# Patient Record
Sex: Female | Born: 1990 | Race: Black or African American | Hispanic: No | Marital: Single | Smoking: Former smoker
Health system: Southern US, Community
[De-identification: ages and names within clinical notes are randomized; demographics above are authoritative.]

## PROBLEM LIST (undated history)

## (undated) DIAGNOSIS — F419 Anxiety disorder, unspecified: Secondary | ICD-10-CM

## (undated) DIAGNOSIS — F329 Major depressive disorder, single episode, unspecified: Secondary | ICD-10-CM

## (undated) DIAGNOSIS — F32A Depression, unspecified: Secondary | ICD-10-CM

## (undated) DIAGNOSIS — J45909 Unspecified asthma, uncomplicated: Secondary | ICD-10-CM

## (undated) HISTORY — PX: TONSILLECTOMY: SUR1361

---
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
## 2009-12-26 ENCOUNTER — Ambulatory Visit: Payer: Self-pay

## 2010-02-09 ENCOUNTER — Emergency Department: Payer: Self-pay | Admitting: Emergency Medicine

## 2010-04-21 ENCOUNTER — Emergency Department: Payer: Self-pay | Admitting: Emergency Medicine

## 2010-07-31 ENCOUNTER — Emergency Department: Payer: Self-pay | Admitting: Emergency Medicine

## 2010-08-08 ENCOUNTER — Emergency Department: Payer: Self-pay | Admitting: Unknown Physician Specialty

## 2010-09-27 ENCOUNTER — Observation Stay: Payer: Self-pay | Admitting: Obstetrics and Gynecology

## 2010-10-31 ENCOUNTER — Observation Stay: Payer: Self-pay

## 2010-12-30 ENCOUNTER — Observation Stay: Payer: Self-pay | Admitting: Obstetrics and Gynecology

## 2011-01-02 ENCOUNTER — Observation Stay: Payer: Self-pay | Admitting: Obstetrics and Gynecology

## 2011-02-04 ENCOUNTER — Observation Stay: Payer: Self-pay | Admitting: Obstetrics and Gynecology

## 2011-02-06 ENCOUNTER — Observation Stay: Payer: Self-pay | Admitting: Obstetrics and Gynecology

## 2011-02-09 ENCOUNTER — Inpatient Hospital Stay: Payer: Self-pay | Admitting: Obstetrics and Gynecology

## 2011-06-14 ENCOUNTER — Emergency Department: Payer: Self-pay

## 2011-11-09 ENCOUNTER — Emergency Department: Payer: Self-pay | Admitting: Emergency Medicine

## 2011-11-09 LAB — URINALYSIS, COMPLETE
Bacteria: NONE SEEN
Bilirubin,UR: NEGATIVE
Blood: NEGATIVE
Glucose,UR: NEGATIVE mg/dL (ref 0–75)
Ketone: NEGATIVE
Leukocyte Esterase: NEGATIVE
Nitrite: NEGATIVE
Specific Gravity: 1.017 (ref 1.003–1.030)
Squamous Epithelial: 1
WBC UR: 1 /HPF (ref 0–5)

## 2011-11-09 LAB — COMPREHENSIVE METABOLIC PANEL
Albumin: 3.5 g/dL (ref 3.4–5.0)
Alkaline Phosphatase: 71 U/L (ref 50–136)
Anion Gap: 4 — ABNORMAL LOW (ref 7–16)
BUN: 9 mg/dL (ref 7–18)
Bilirubin,Total: 0.3 mg/dL (ref 0.2–1.0)
Calcium, Total: 9 mg/dL (ref 8.5–10.1)
Chloride: 109 mmol/L — ABNORMAL HIGH (ref 98–107)
Co2: 26 mmol/L (ref 21–32)
Creatinine: 0.81 mg/dL (ref 0.60–1.30)
EGFR (African American): >60
EGFR (Non-African Amer.): >60
Osmolality: 276 (ref 275–301)
SGPT (ALT): 20 U/L (ref 12–78)
Sodium: 139 mmol/L (ref 136–145)
Total Protein: 7.3 g/dL (ref 6.4–8.2)

## 2011-11-09 LAB — CBC
HGB: 12.4 g/dL (ref 12.0–16.0)
MCV: 72 fL — ABNORMAL LOW (ref 80–100)
RBC: 5.35 10*6/uL — ABNORMAL HIGH (ref 3.80–5.20)
RDW: 17.7 % — ABNORMAL HIGH (ref 11.5–14.5)
WBC: 8.8 10*3/uL (ref 3.6–11.0)

## 2011-11-09 LAB — PREGNANCY, URINE: Pregnancy Test, Urine: NEGATIVE m[IU]/mL

## 2012-02-11 IMAGING — US ABDOMEN ULTRASOUND
1 series · 17 of 25 positions shown · non-contrast
Comparison: none

REASON FOR EXAM: pain ruq and epigastric,  pregnant
COMMENTS:   LMP: N/A

[Series 1: abdomen ultrasound · 17 of 73 slices shown]
[im 1/73]
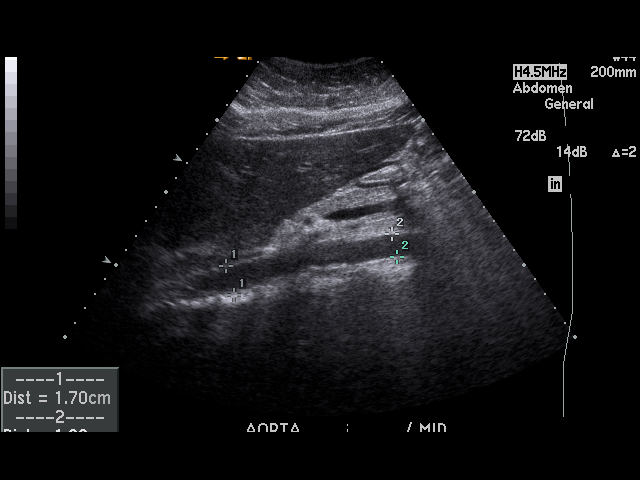
[im 7/73]
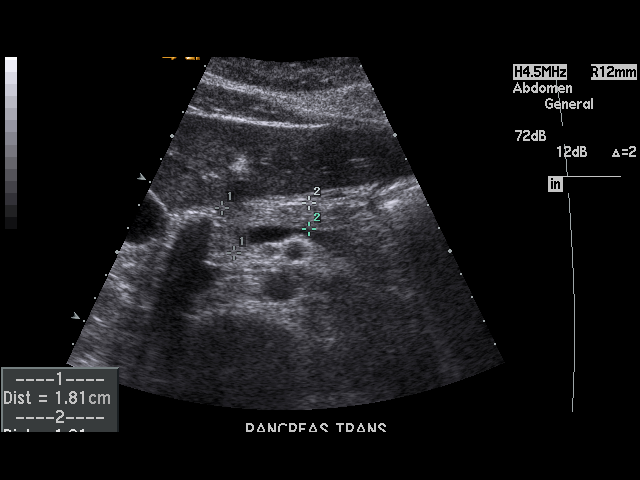
[im 10/73]
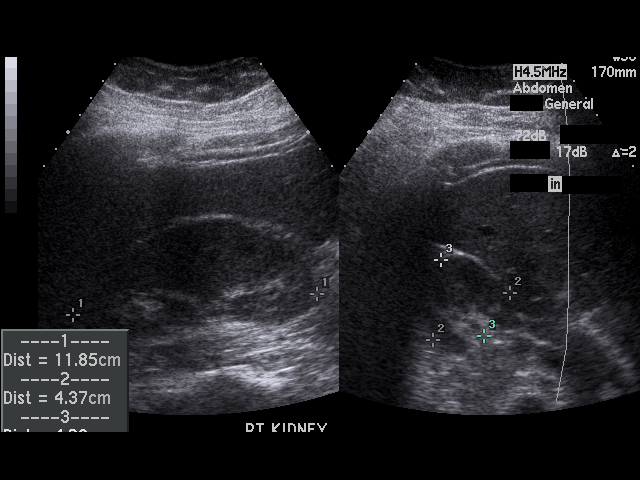
[im 16/73]
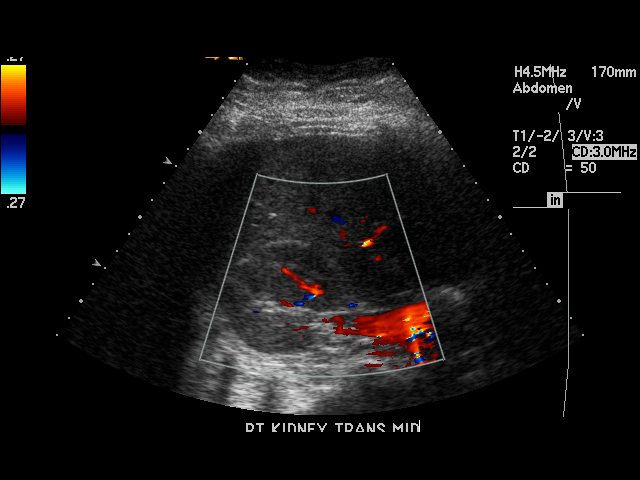
[im 19/73]
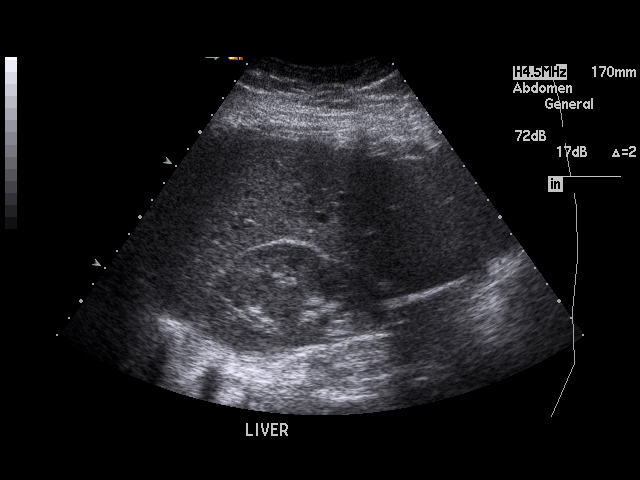
[im 25/73]
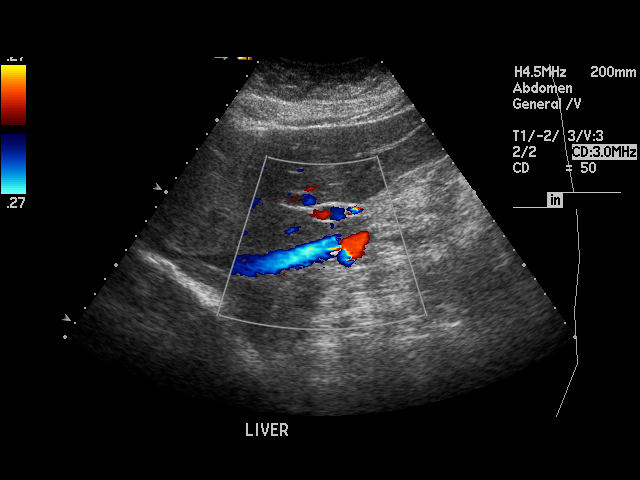
[im 28/73]
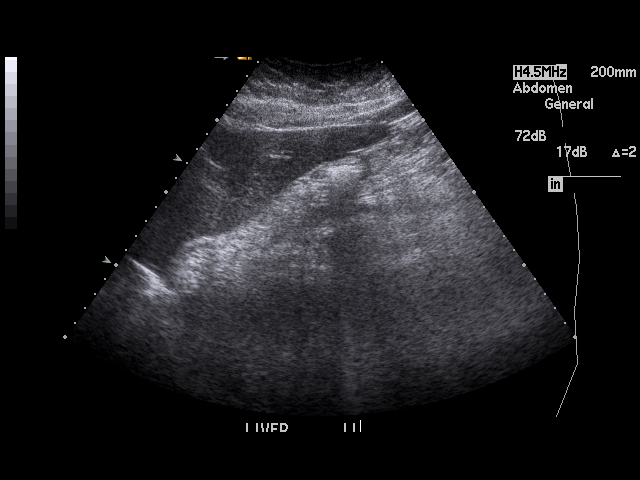
[im 34/73]
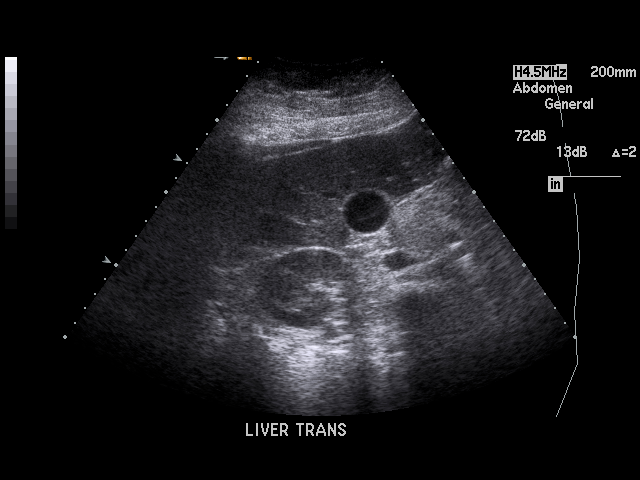
[im 37/73]
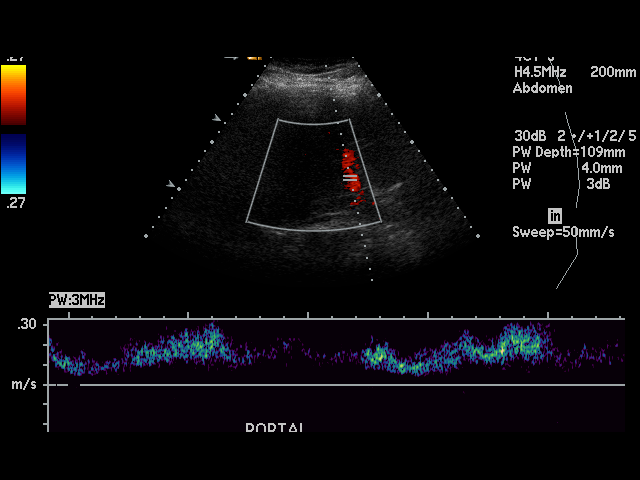
[im 40/73]
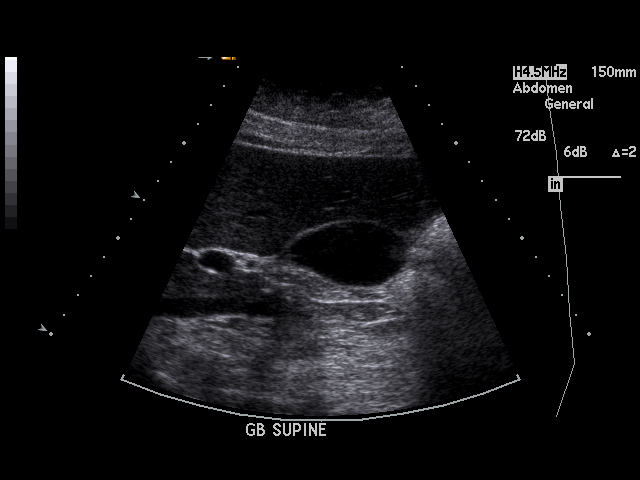
[im 46/73]
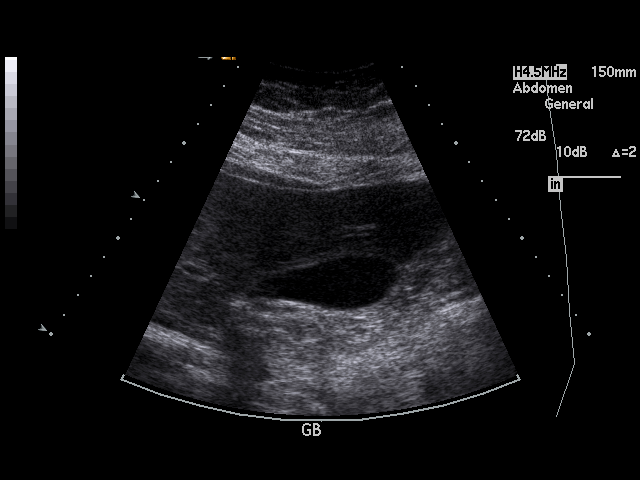
[im 49/73]
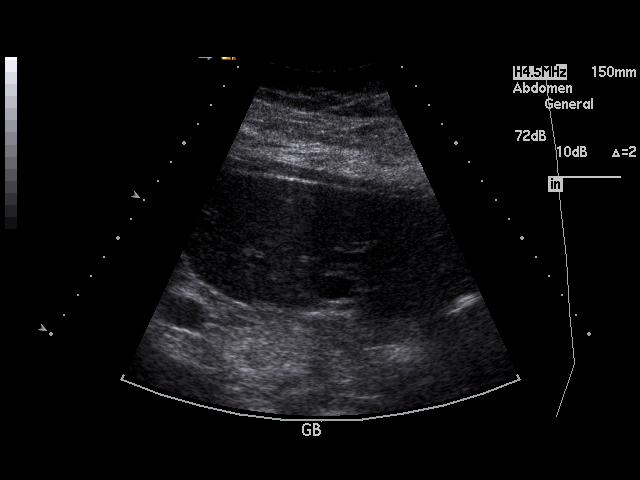
[im 55/73]
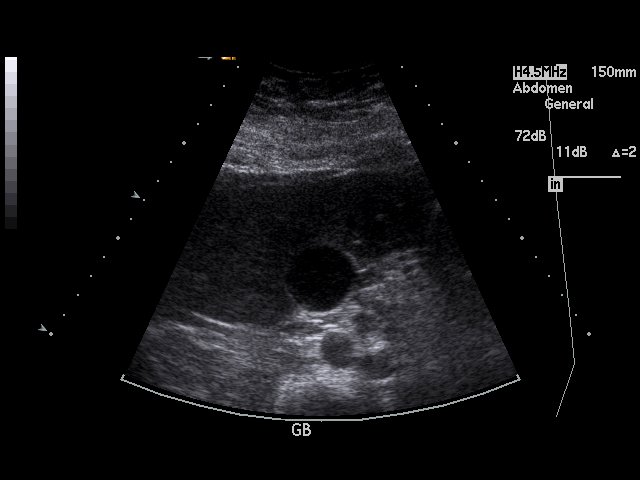
[im 58/73]
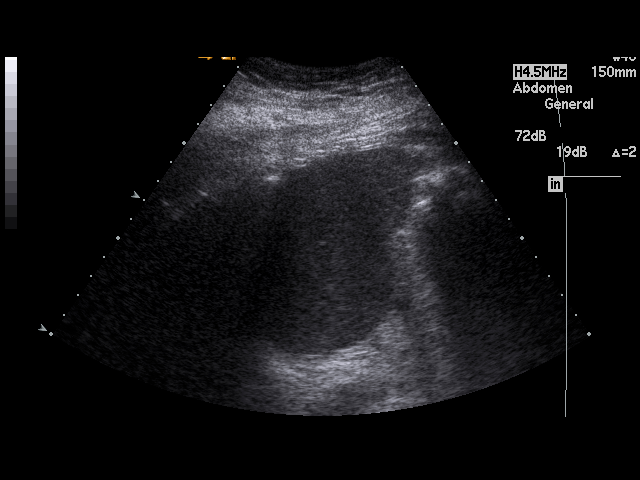
[im 64/73]
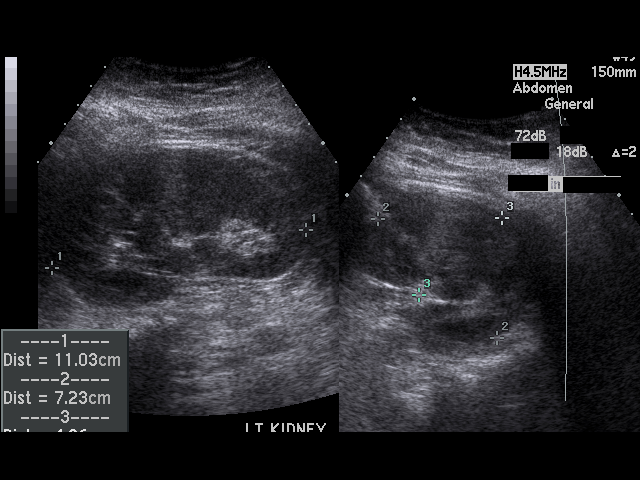
[im 67/73]
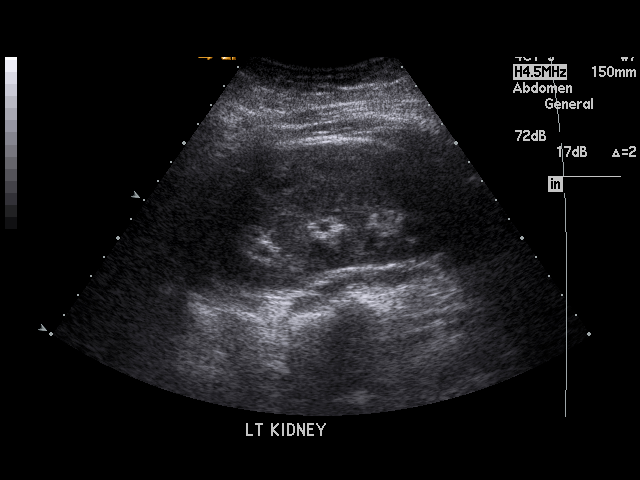
[im 73/73]
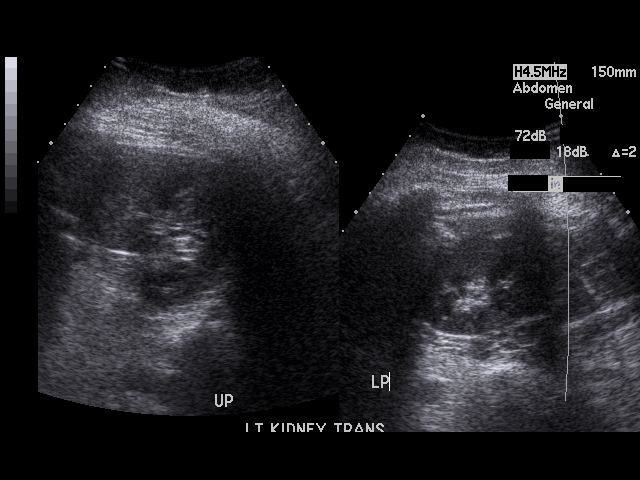

[17 of 25 positions shown; findings below may reference images not displayed]

PROCEDURE:     US  - US ABDOMEN GENERAL SURVEY  - July 31, 2010  [DATE]

RESULT:     The liver, spleen and inferior vena cava show no significant
abnormalities. Portions of the pancreas and of the abdominal aorta are
obscured by bowel gas but the visualized portions are normal in appearance.
No gallstones are seen. There is no thickening of the gall bladder wall. The
common bile duct measures 3.1 mm in diameter which is within normal limits.
The kidneys show no hydronephrosis. Sagittally, the right kidney measures
11.85 cm and the left measures 11.03 cm. No ascites is seen.
IMPRESSION: 1.  No gallstones or other acute changes identified.
2.  No hydronephrosis is observed.
3.  The patient reportedly is pregnant.

## 2012-08-02 ENCOUNTER — Emergency Department: Payer: Self-pay | Admitting: Emergency Medicine

## 2013-03-15 ENCOUNTER — Ambulatory Visit: Payer: Self-pay

## 2013-03-15 LAB — RAPID STREP-A WITH REFLX: Micro Text Report: NEGATIVE

## 2013-03-18 LAB — BETA STREP CULTURE(ARMC)

## 2014-09-26 ENCOUNTER — Encounter: Payer: Self-pay | Admitting: Emergency Medicine

## 2014-09-26 ENCOUNTER — Emergency Department
Admission: EM | Admit: 2014-09-26 | Discharge: 2014-09-26 | Disposition: A | Discharge status: Home / Self Care | Payer: Medicaid Other | Attending: Emergency Medicine | Admitting: Emergency Medicine

## 2014-09-26 DIAGNOSIS — Z720 Tobacco use: Secondary | ICD-10-CM | POA: Diagnosis not present

## 2014-09-26 DIAGNOSIS — N3090 Cystitis, unspecified without hematuria: Secondary | ICD-10-CM | POA: Diagnosis not present

## 2014-09-26 DIAGNOSIS — Z880 Allergy status to penicillin: Secondary | ICD-10-CM | POA: Insufficient documentation

## 2014-09-26 DIAGNOSIS — R1030 Lower abdominal pain, unspecified: Secondary | ICD-10-CM | POA: Diagnosis present

## 2014-09-26 DIAGNOSIS — Z79899 Other long term (current) drug therapy: Secondary | ICD-10-CM | POA: Diagnosis not present

## 2014-09-26 LAB — URINALYSIS COMPLETE WITH MICROSCOPIC (ARMC ONLY)
Bilirubin Urine: NEGATIVE
Glucose, UA: NEGATIVE mg/dL
KETONES UR: NEGATIVE mg/dL
NITRITE: NEGATIVE
PH: 6 (ref 5.0–8.0)
Protein, ur: 30 mg/dL — AB
SPECIFIC GRAVITY, URINE: 1.03 (ref 1.005–1.030)

## 2014-09-26 LAB — CBC WITH DIFFERENTIAL/PLATELET
BASOS ABS: 0.1 10*3/uL (ref 0–0.1)
Basophils Relative: 1 %
EOS ABS: 0.2 10*3/uL (ref 0–0.7)
Eosinophils Relative: 3 %
HEMATOCRIT: 41 % (ref 35.0–47.0)
HEMOGLOBIN: 13.2 g/dL (ref 12.0–16.0)
Lymphocytes Relative: 34 %
Lymphs Abs: 2.5 10*3/uL (ref 1.0–3.6)
MCH: 25.9 pg — ABNORMAL LOW (ref 26.0–34.0)
MCHC: 32.2 g/dL (ref 32.0–36.0)
MCV: 80.4 fL (ref 80.0–100.0)
Monocytes Absolute: 0.6 10*3/uL (ref 0.2–0.9)
Monocytes Relative: 8 %
NEUTROS ABS: 4 10*3/uL (ref 1.4–6.5)
Neutrophils Relative %: 54 %
Platelets: 231 10*3/uL (ref 150–440)
RBC: 5.1 MIL/uL (ref 3.80–5.20)
RDW: 14.2 % (ref 11.5–14.5)
WBC: 7.3 10*3/uL (ref 3.6–11.0)

## 2014-09-26 LAB — COMPREHENSIVE METABOLIC PANEL
ALK PHOS: 50 U/L (ref 38–126)
ALT: 19 U/L (ref 14–54)
AST: 13 U/L — ABNORMAL LOW (ref 15–41)
Albumin: 3.8 g/dL (ref 3.5–5.0)
Anion gap: 6 (ref 5–15)
BUN: 7 mg/dL (ref 6–20)
CO2: 27 mmol/L (ref 22–32)
Calcium: 9 mg/dL (ref 8.9–10.3)
Chloride: 107 mmol/L (ref 101–111)
Creatinine, Ser: 0.76 mg/dL (ref 0.44–1.00)
GFR calc Af Amer: >60 mL/min (ref >60)
Glucose, Bld: 86 mg/dL (ref 65–99)
Potassium: 4 mmol/L (ref 3.5–5.1)
Sodium: 140 mmol/L (ref 135–145)
Total Bilirubin: 0.6 mg/dL (ref 0.3–1.2)
Total Protein: 7.3 g/dL (ref 6.5–8.1)

## 2014-09-26 LAB — LIPASE, BLOOD: Lipase: 21 U/L — ABNORMAL LOW (ref 22–51)

## 2014-09-26 MED ORDER — NITROFURANTOIN MACROCRYSTAL 100 MG PO CAPS: 100.0000 mg | ORAL_CAPSULE | Freq: Two times a day (BID) | ORAL | Status: DC

## 2014-09-26 MED ORDER — ONDANSETRON 4 MG PO TBDP
8.0000 mg | ORAL_TABLET | Freq: Once | ORAL | Status: AC
  Administered 2014-09-26: 8 mg via ORAL
  Filled 2014-09-26: qty 2

## 2014-09-26 NOTE — ED Notes (Signed)
Reports pelvic pain.  Seen at health dept yesterday, states today she started having vaginal bleeding.  Reports  she has an IUD.

## 2014-09-26 NOTE — ED Provider Notes (Signed)
[facility] Emergency Department Provider Note  ____________________________________________  Time seen: 2:15 PM  I have reviewed the triage vital signs and the nursing notes.   HISTORY  Chief Complaint Abdominal Pain    HPI Alexis Gutierrez is a 24 y.o. female who complains of pain in her suprapubic area for the past 2 days. She was seen at the health department yesterday where she had a pelvic exam and was told to take Tylenol without any other medications. She has an IUD and is concerned that this is causing her problems. She reported some vaginal bleeding today. Her last period was more than 3 months ago.No fevers chills no vomiting, no vaginal discharge or syncope.     History reviewed. No pertinent past medical history.  There are no active problems to display for this patient.   Past Surgical History  Procedure Laterality Date  . Tonsillectomy      Current Outpatient Rx  Name  Route  Sig  Dispense  Refill  . nitrofurantoin (MACRODANTIN) 100 MG capsule   Oral   Take 1 capsule (100 mg total) by mouth 2 (two) times daily.   6 capsule   0     Allergies Amoxicillin and Penicillins  History reviewed. No pertinent family history.  Social History History  Substance Use Topics  . Smoking status: Current Some Day Smoker  . Smokeless tobacco: Not on file  . Alcohol Use: No    Review of Systems  Constitutional: No fever or chills. No weight changes Eyes:No blurry vision or double vision.  ENT: No sore throat. Cardiovascular: No chest pain. Respiratory: No dyspnea or cough. Gastrointestinal: Negative for abdominal pain, vomiting and diarrhea.  No BRBPR or melena. Genitourinary: Negative for dysuria, urinary retention, bloody urine, or difficulty urinating. Complaint of vaginal bleeding today Musculoskeletal: Negative for back pain. No joint swelling or pain. Skin: Negative for rash. Neurological: Negative for headaches, focal weakness  or numbness. Psychiatric:No anxiety or depression.   Endocrine:No hot/cold intolerance, changes in energy, or sleep difficulty.  10-point ROS otherwise negative.  ____________________________________________   PHYSICAL EXAM:  VITAL SIGNS: ED Triage Vitals  Enc Vitals Group     BP 09/26/14 1243 121/78 mmHg     Pulse Rate 09/26/14 1243 62     Resp 09/26/14 1243 18     Temp 09/26/14 1243 98.4 F (36.9 C)     Temp Source 09/26/14 1243 Oral     SpO2 09/26/14 1243 100 %     Weight 09/26/14 1243 310 lb (140.615 kg)     Height 09/26/14 1243  (1.626 m)     Head Cir --      Peak Flow --      Pain Score 09/26/14 1246 10     Pain Loc --      Pain Edu? --      Excl. in GC? --      Constitutional: Alert and oriented. Well appearing and in no distress. Smiling, playing with phone .  Morbidly obese Eyes: No scleral icterus. No conjunctival pallor. PERRL. EOMI ENT   Head: Normocephalic and atraumatic.   Nose: No congestion/rhinnorhea. No septal hematoma   Mouth/Throat: MMM, no pharyngeal erythema. No peritonsillar mass. No uvula shift.   Neck: No stridor. No SubQ emphysema. No meningismus. Hematological/Lymphatic/Immunilogical: No cervical lymphadenopathy. Cardiovascular: RRR. Normal and symmetric distal pulses are present in all extremities. No murmurs, rubs, or gallops. Respiratory: Normal respiratory effort without tachypnea nor retractions. Breath sounds are clear and  equal bilaterally. No wheezes/rales/rhonchi. Gastrointestinal: Soft with suprapubic tenderness. No distention. There is no CVA tenderness.  No rebound, rigidity, or guarding. Genitourinary: deferred Musculoskeletal: Nontender with normal range of motion in all extremities. No joint effusions.  No lower extremity tenderness.  No edema. Neurologic:   Normal speech and language.  CN 2-10 normal. Motor grossly intact. No pronator drift.  Normal gait. No gross focal neurologic deficits are appreciated.   Skin:  Skin is warm, dry and intact. No rash noted.  No petechiae, purpura, or bullae. Psychiatric: Mood and affect are normal. Speech and behavior are normal. Patient exhibits appropriate insight and judgment.  ____________________________________________    LABS (pertinent positives/negatives) (all labs ordered are listed, but only abnormal results are displayed) Labs Reviewed  CBC WITH DIFFERENTIAL/PLATELET - Abnormal; Notable for the following:    MCH 25.9 (*)    All other components within normal limits  URINALYSIS COMPLETEWITH MICROSCOPIC ([facility] ONLY) - Abnormal; Notable for the following:    Color, Urine YELLOW (*)    APPearance HAZY (*)    Hgb urine dipstick 3+ (*)    Protein, ur 30 (*)    Leukocytes, UA 2+ (*)    Bacteria, UA FEW (*)    Squamous Epithelial / LPF 6-30 (*)    All other components within normal limits  URINE CULTURE  COMPREHENSIVE METABOLIC PANEL  LIPASE, BLOOD  POC URINE PREG, ED   POC urine pregnancy negative ____________________________________________   EKG    ____________________________________________    RADIOLOGY    ____________________________________________   PROCEDURES  ____________________________________________   INITIAL IMPRESSION / ASSESSMENT AND PLAN / ED COURSE  Pertinent labs & imaging results that were available during my care of the patient were reviewed by me and considered in my medical decision making (see chart for details).  Patient presents with suprapubic pain and suprapubic tenderness and a urinalysis consistent with urinary tract infection and cystitis. No evidence of pyelonephritis on exam and vitals. Patient related some mild nausea will give her Zofran but is otherwise tolerating oral intake. We'll start her on Macrobid and send a follow-up urine culture.  ____________________________________________   FINAL CLINICAL IMPRESSION(S) / ED DIAGNOSES  Final diagnoses:  Cystitis      Sharman Cheek, MD 09/26/14 1440

## 2014-09-26 NOTE — Discharge Instructions (Signed)

## 2014-09-28 LAB — URINE CULTURE: Special Requests: NORMAL

## 2015-01-03 ENCOUNTER — Encounter: Payer: Self-pay | Admitting: Emergency Medicine

## 2015-01-03 ENCOUNTER — Emergency Department: Payer: Medicaid Other

## 2015-01-03 ENCOUNTER — Emergency Department
Admission: EM | Admit: 2015-01-03 | Discharge: 2015-01-03 | Disposition: A | Discharge status: Home / Self Care | Payer: Medicaid Other | Attending: Emergency Medicine | Admitting: Emergency Medicine

## 2015-01-03 DIAGNOSIS — Z880 Allergy status to penicillin: Secondary | ICD-10-CM | POA: Insufficient documentation

## 2015-01-03 DIAGNOSIS — M25561 Pain in right knee: Secondary | ICD-10-CM | POA: Insufficient documentation

## 2015-01-03 DIAGNOSIS — Z720 Tobacco use: Secondary | ICD-10-CM | POA: Diagnosis not present

## 2015-01-03 DIAGNOSIS — G8929 Other chronic pain: Secondary | ICD-10-CM | POA: Insufficient documentation

## 2015-01-03 DIAGNOSIS — R2689 Other abnormalities of gait and mobility: Secondary | ICD-10-CM | POA: Diagnosis not present

## 2015-01-03 DIAGNOSIS — M25551 Pain in right hip: Secondary | ICD-10-CM | POA: Diagnosis not present

## 2015-01-03 DIAGNOSIS — M79604 Pain in right leg: Secondary | ICD-10-CM | POA: Diagnosis present

## 2015-01-03 DIAGNOSIS — R52 Pain, unspecified: Secondary | ICD-10-CM

## 2015-01-03 DIAGNOSIS — Z792 Long term (current) use of antibiotics: Secondary | ICD-10-CM | POA: Insufficient documentation

## 2015-01-03 DIAGNOSIS — N390 Urinary tract infection, site not specified: Secondary | ICD-10-CM | POA: Diagnosis not present

## 2015-01-03 DIAGNOSIS — Z3202 Encounter for pregnancy test, result negative: Secondary | ICD-10-CM | POA: Diagnosis not present

## 2015-01-03 HISTORY — DX: Unspecified asthma, uncomplicated: J45.909

## 2015-01-03 LAB — URINALYSIS COMPLETE WITH MICROSCOPIC (ARMC ONLY)
Bilirubin Urine: NEGATIVE
Glucose, UA: NEGATIVE mg/dL
KETONES UR: NEGATIVE mg/dL
NITRITE: NEGATIVE
PH: 5 (ref 5.0–8.0)
Protein, ur: NEGATIVE mg/dL
Specific Gravity, Urine: 1.023 (ref 1.005–1.030)

## 2015-01-03 LAB — POCT PREGNANCY, URINE: Preg Test, Ur: NEGATIVE

## 2015-01-03 MED ORDER — SULFAMETHOXAZOLE-TRIMETHOPRIM 800-160 MG PO TABS: 1.0000 | ORAL_TABLET | Freq: Two times a day (BID) | ORAL | Status: DC

## 2015-01-03 MED ORDER — MELOXICAM 15 MG PO TABS: 15.0000 mg | ORAL_TABLET | Freq: Every day | ORAL | Status: DC

## 2015-01-03 NOTE — ED Provider Notes (Signed)
[facility] Emergency Department Provider Note  ____________________________________________  Time seen: Approximately 4:10 PM  I have reviewed the triage vital signs and the nursing notes.   HISTORY  Chief Complaint Leg Pain    HPI Alexis Gutierrez is a 24 y.o. female patient complain of right hip and right knee pain for 3 days. 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 Patient stated the pain in the hip has increase in the last 2-3 days especially with walking. Patient also noticed she is having difficulty flexing her right knee when child ambulate. Patient states there is also pain with flexion the knee in a nonweightbearing status. Patient is rating her pain and discomfort from the hip to the knee as a 10 over 10. No palliative measures taken for this complaint. Patient states the last imaging done of her hip was in 2013. Past Medical History  Diagnosis Date  . Asthma     There are no active problems to display for this patient.   Past Surgical History  Procedure Laterality Date  . Tonsillectomy      Current Outpatient Rx  Name  Route  Sig  Dispense  Refill  . nitrofurantoin (MACRODANTIN) 100 MG capsule   Oral   Take 1 capsule (100 mg total) by mouth 2 (two) times daily.   6 capsule   0     Allergies Amoxicillin and Penicillins  No family history on file.  Social History Social History  Substance Use Topics  . Smoking status: Current Some Day Smoker  . Smokeless tobacco: None  . Alcohol Use: No    Review of Systems Constitutional: No fever/chills Eyes: No visual changes. ENT: No sore throat. Cardiovascular: Denies chest pain. Respiratory: Denies shortness of breath. Gastrointestinal: No abdominal pain.  No nausea, no vomiting.  No diarrhea.  No constipation. Genitourinary: Negative for dysuria. Musculoskeletal: Positive for back pain, right hip and right knee pain Skin: Negative for  rash. Neurological: Negative for headaches, focal weakness or numbness. Endocrine:Mood obesity Allergic/Immunilogical: Penicillin  10-point ROS otherwise negative.  ____________________________________________   PHYSICAL EXAM:  VITAL SIGNS: ED Triage Vitals  Enc Vitals Group     BP 01/03/15 1517 126/61 mmHg     Pulse Rate 01/03/15 1517 82     Resp 01/03/15 1517 18     Temp 01/03/15 1517 99.1 F (37.3 C)     Temp Source 01/03/15 1517 Oral     SpO2 01/03/15 1517 98 %     Weight 01/03/15 1517 306 lb (138.801 kg)     Height 01/03/15 1517 5\' 4"  (1.626 m)     Head Cir --      Peak Flow --      Pain Score 01/03/15 1518 10     Pain Loc --      Pain Edu? --      Excl. in GC? --     Constitutional: Alert and oriented. Well appearing and in no acute distress. Morbid obesity Eyes: Conjunctivae are normal. PERRL. EOMI. Head: Atraumatic. Nose: No congestion/rhinnorhea. Mouth/Throat: Mucous membranes are moist.  Oropharynx non-erythematous. Neck: No stridor.  No cervical spine tenderness to palpation. Hematological/Lymphatic/Immunilogical: No cervical lymphadenopathy. Cardiovascular: Normal rate, regular rhythm. Grossly normal heart sounds.  Good peripheral circulation. Respiratory: Normal respiratory effort.  No retractions. Lungs CTAB. Gastrointestinal: Soft and nontender. No distention. No abdominal bruits. No CVA tenderness. Musculoskeletal: No deformity of the right hip no leg length discrepancy. Patient is tender palpation at  greater trochanter area. There is no right knee deformity. There is no effusion. She is tender palpation anterior patella patient have decrease range of motion with flexion of the knee limited by complaining of pain.  Neurologic:  Normal speech and language. No gross focal neurologic deficits are appreciated. No gait instability. Skin:  Skin is warm, dry and intact. No rash noted. Psychiatric: Mood and affect are normal. Speech and behavior are  normal.  ____________________________________________   LABS (all labs ordered are listed, but only abnormal results are displayed)  Labs Reviewed  URINALYSIS COMPLETEWITH MICROSCOPIC ([facility] ONLY) - Abnormal; Notable for the following:    Color, Urine YELLOW (*)    APPearance CLOUDY (*)    Hgb urine dipstick 1+ (*)    Leukocytes, UA 3+ (*)    Bacteria, UA FEW (*)    Squamous Epithelial / LPF TOO NUMEROUS TO COUNT (*)    All other components within normal limits  POC URINE PREG, ED  POCT PREGNANCY, URINE   ____________________________________________  EKG   ____________________________________________  RADIOLOGY  No acute findings right hip and right knee on x-ray. I, Joni Reining, personally viewed and evaluated these images (plain radiographs) as part of my medical decision making.   ____________________________________________   PROCEDURES  Procedure(s) performed: None  Critical Care performed: No  ____________________________________________   INITIAL IMPRESSION / ASSESSMENT AND PLAN / ED COURSE  Pertinent labs & imaging results that were available during my care of the patient were reviewed by me and considered in my medical decision making (see chart for details).  Chronic right hip pain. Right knee pain. Urinary tract infection. Discussed x-ray findings with patient. Advised patient to follow orthopedics for definitive evaluation and treatment of her hip and knee pain. Patient has urinary tract infection. Patient given a prescription for [facility] and Bactrim DS. Advised patient have urine retested by her PCP in 10 days. ____________________________________________   FINAL CLINICAL IMPRESSION(S) / ED DIAGNOSES  Final diagnoses:  Pain aggravated by walking  Chronic right hip pain  Right knee pain  UTI (lower urinary tract infection)      Joni Reining, PA-C 01/03/15 1736  Myrna Blazer, MD 01/03/15 [phone]

## 2015-01-03 NOTE — ED Notes (Signed)
Patient presents to the ED with right leg pain x 3 days.  Patient states pain is increasing as well as disability.  Patient states today she is having difficulty bending her knee and cannot walk since 2 days ago.  Patient reports chronic back pain.  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

## 2015-01-15 ENCOUNTER — Ambulatory Visit
Admission: RE | Admit: 2015-01-15 | Discharge: 2015-01-15 | Disposition: A | Discharge status: Home / Self Care | Payer: Disability Insurance | Source: Ambulatory Visit | Attending: Dentistry | Admitting: Dentistry

## 2015-01-15 ENCOUNTER — Other Ambulatory Visit: Payer: Self-pay | Admitting: Dentistry

## 2015-01-15 DIAGNOSIS — F419 Anxiety disorder, unspecified: Secondary | ICD-10-CM | POA: Diagnosis not present

## 2015-01-15 DIAGNOSIS — M519 Unspecified thoracic, thoracolumbar and lumbosacral intervertebral disc disorder: Secondary | ICD-10-CM | POA: Diagnosis present

## 2015-01-15 DIAGNOSIS — M25851 Other specified joint disorders, right hip: Secondary | ICD-10-CM | POA: Insufficient documentation

## 2015-01-15 DIAGNOSIS — F329 Major depressive disorder, single episode, unspecified: Secondary | ICD-10-CM | POA: Diagnosis not present

## 2015-01-15 DIAGNOSIS — F909 Attention-deficit hyperactivity disorder, unspecified type: Secondary | ICD-10-CM | POA: Insufficient documentation

## 2015-07-09 DIAGNOSIS — Z87898 Personal history of other specified conditions: Secondary | ICD-10-CM | POA: Insufficient documentation

## 2016-07-16 IMAGING — CR DG HIP (WITH OR WITHOUT PELVIS) 2-3V*R*
3 series · 3 of 3 positions shown · non-contrast
Comparison: None.

CLINICAL DATA: Right hip pain/stiffness, unable to bear weight

EXAM:
DG HIP (WITH OR WITHOUT PELVIS) 2-3V RIGHT

[pelvis ap]
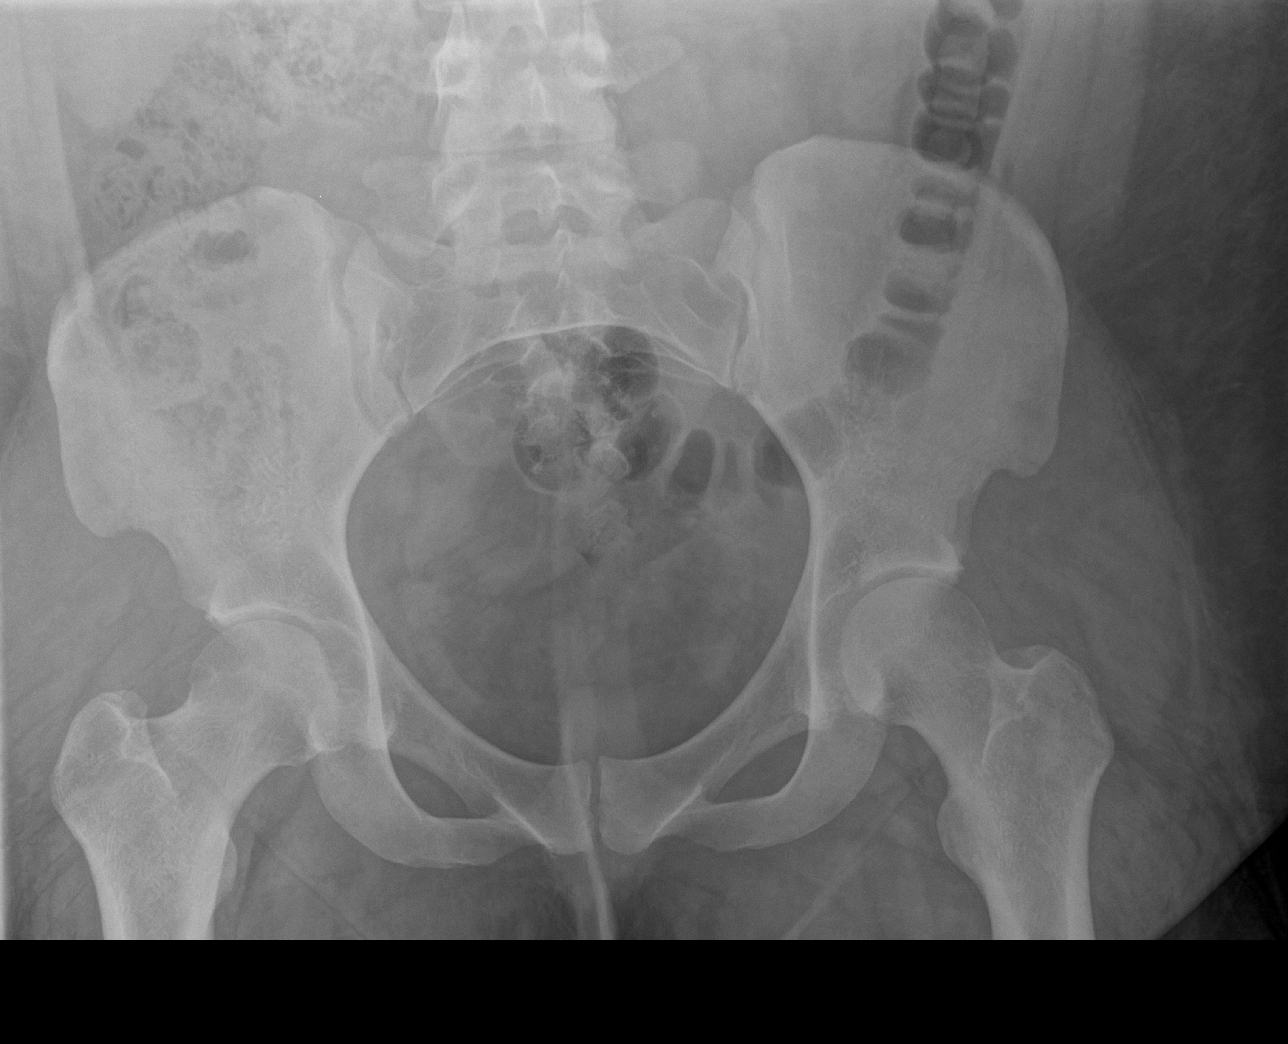

[hip ap]
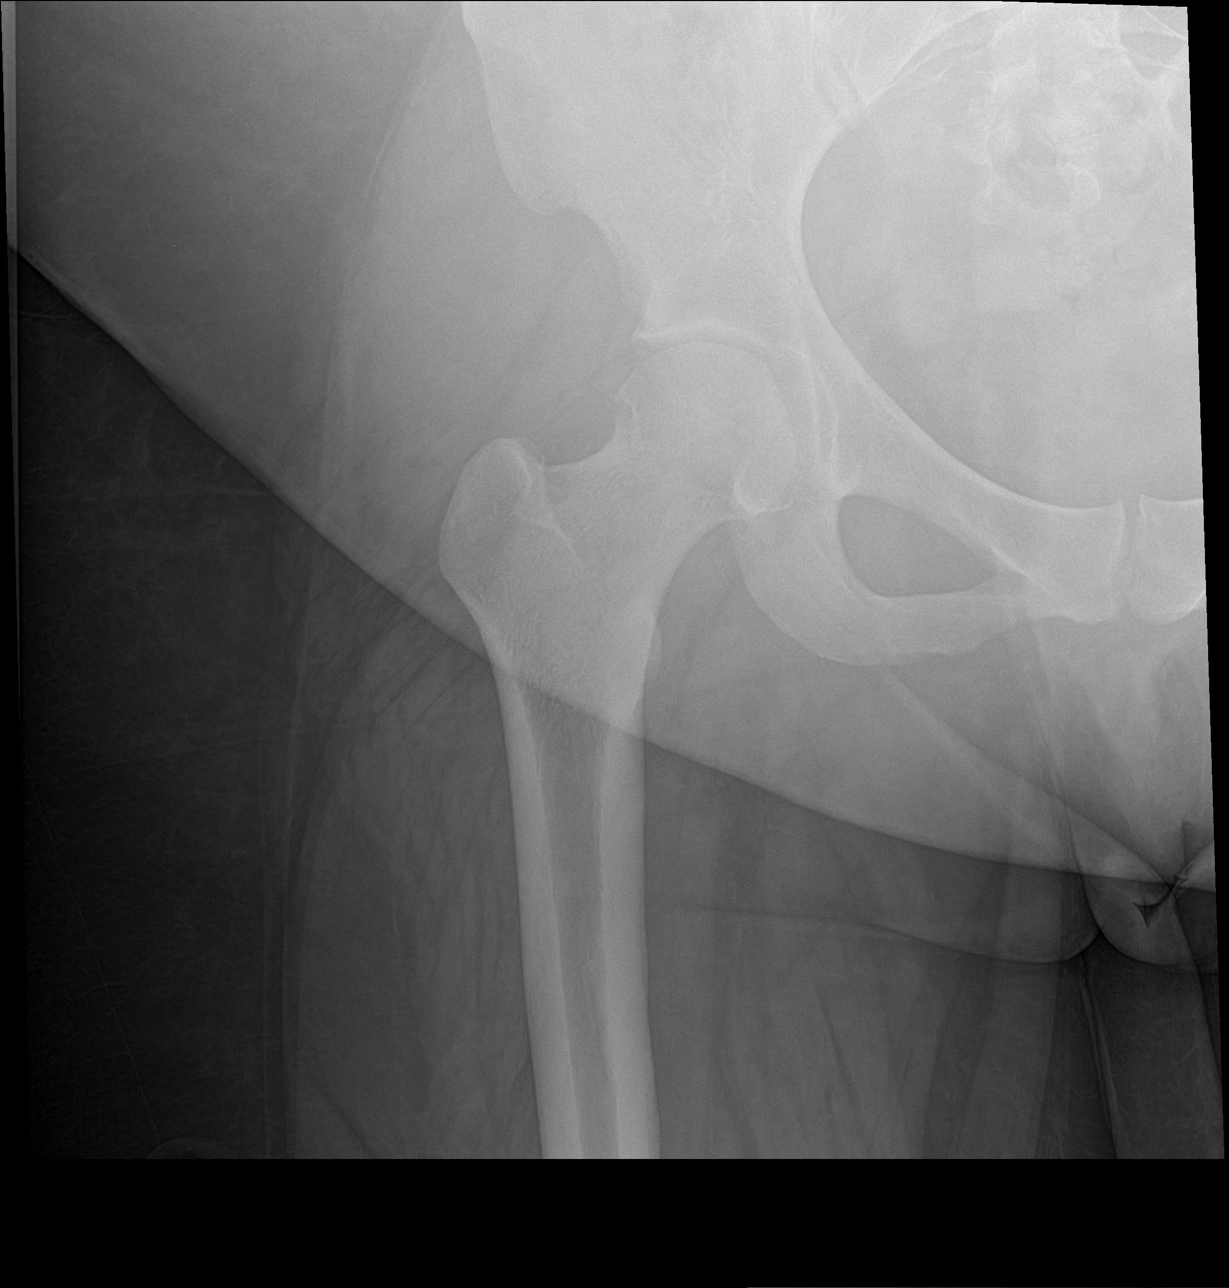

[hip lat]
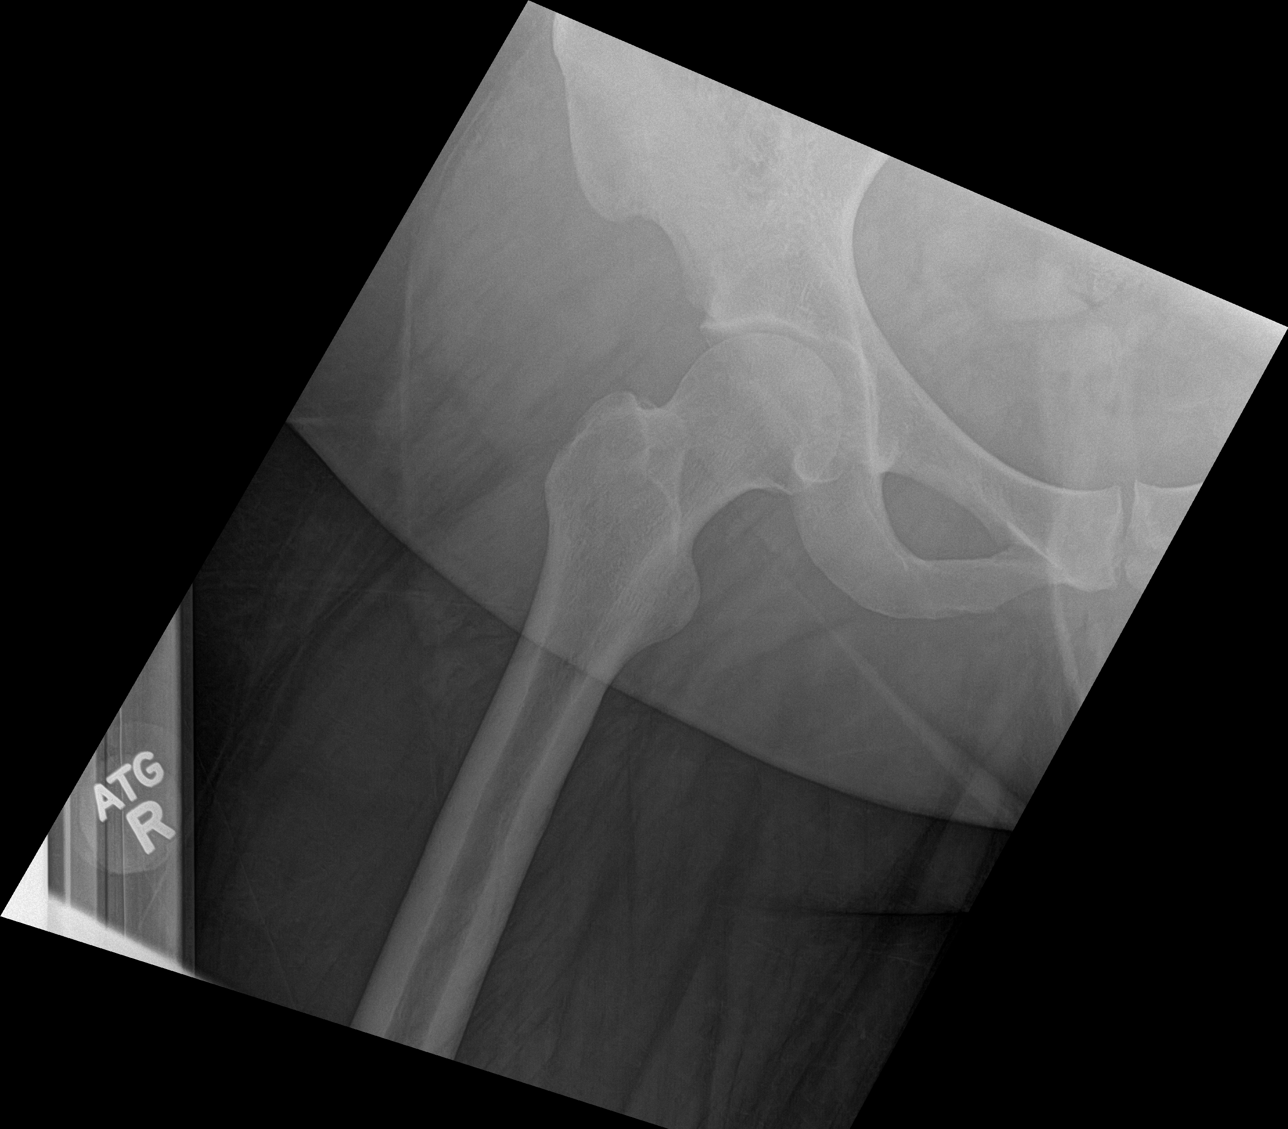

[3 of 3 positions shown; findings below may reference images not displayed]

FINDINGS: No fracture or dislocation is seen.

Bilateral hip joint spaces are preserved.

Visualized bony pelvis appears intact.

Lower lumbar spine is within normal limits.
IMPRESSION: Normal right hip radiographs.

## 2017-02-02 ENCOUNTER — Emergency Department
Admission: EM | Admit: 2017-02-02 | Discharge: 2017-02-02 | Disposition: A | Discharge status: Home / Self Care | Payer: Medicaid Other | Attending: Emergency Medicine | Admitting: Emergency Medicine

## 2017-02-02 ENCOUNTER — Encounter: Payer: Self-pay | Admitting: Emergency Medicine

## 2017-02-02 DIAGNOSIS — J45909 Unspecified asthma, uncomplicated: Secondary | ICD-10-CM | POA: Insufficient documentation

## 2017-02-02 DIAGNOSIS — R102 Pelvic and perineal pain: Secondary | ICD-10-CM | POA: Diagnosis not present

## 2017-02-02 DIAGNOSIS — A64 Unspecified sexually transmitted disease: Secondary | ICD-10-CM

## 2017-02-02 DIAGNOSIS — Z79899 Other long term (current) drug therapy: Secondary | ICD-10-CM | POA: Diagnosis not present

## 2017-02-02 DIAGNOSIS — R1030 Lower abdominal pain, unspecified: Secondary | ICD-10-CM | POA: Diagnosis present

## 2017-02-02 DIAGNOSIS — Z202 Contact with and (suspected) exposure to infections with a predominantly sexual mode of transmission: Secondary | ICD-10-CM | POA: Diagnosis not present

## 2017-02-02 DIAGNOSIS — F17200 Nicotine dependence, unspecified, uncomplicated: Secondary | ICD-10-CM | POA: Diagnosis not present

## 2017-02-02 LAB — COMPREHENSIVE METABOLIC PANEL
ALBUMIN: 3.6 g/dL (ref 3.5–5.0)
ALK PHOS: 51 U/L (ref 38–126)
ALT: 19 U/L (ref 14–54)
ANION GAP: 6 (ref 5–15)
AST: 14 U/L — AB (ref 15–41)
BILIRUBIN TOTAL: 0.4 mg/dL (ref 0.3–1.2)
BUN: 7 mg/dL (ref 6–20)
CALCIUM: 9 mg/dL (ref 8.9–10.3)
CO2: 25 mmol/L (ref 22–32)
CREATININE: 0.86 mg/dL (ref 0.44–1.00)
Chloride: 106 mmol/L (ref 101–111)
GFR calc Af Amer: >60 mL/min (ref >60)
GFR calc non Af Amer: >60 mL/min (ref >60)
GLUCOSE: 100 mg/dL — AB (ref 65–99)
Potassium: 4 mmol/L (ref 3.5–5.1)
Sodium: 137 mmol/L (ref 135–145)
TOTAL PROTEIN: 7 g/dL (ref 6.5–8.1)

## 2017-02-02 LAB — URINALYSIS, COMPLETE (UACMP) WITH MICROSCOPIC
Bacteria, UA: NONE SEEN
Bilirubin Urine: NEGATIVE
GLUCOSE, UA: NEGATIVE mg/dL
HGB URINE DIPSTICK: NEGATIVE
Ketones, ur: NEGATIVE mg/dL
NITRITE: NEGATIVE
Protein, ur: NEGATIVE mg/dL
SPECIFIC GRAVITY, URINE: 1.023 (ref 1.005–1.030)
pH: 5 (ref 5.0–8.0)

## 2017-02-02 LAB — WET PREP, GENITAL
CLUE CELLS WET PREP: NONE SEEN
Sperm: NONE SEEN
TRICH WET PREP: NONE SEEN
Yeast Wet Prep HPF POC: NONE SEEN

## 2017-02-02 LAB — CBC
HCT: 41.4 % (ref 35.0–47.0)
Hemoglobin: 13.5 g/dL (ref 12.0–16.0)
MCH: 26.3 pg (ref 26.0–34.0)
MCHC: 32.6 g/dL (ref 32.0–36.0)
MCV: 80.6 fL (ref 80.0–100.0)
PLATELETS: 238 10*3/uL (ref 150–440)
RBC: 5.13 MIL/uL (ref 3.80–5.20)
RDW: 14.8 % — AB (ref 11.5–14.5)
WBC: 7.2 10*3/uL (ref 3.6–11.0)

## 2017-02-02 LAB — POCT PREGNANCY, URINE
PREG TEST UR: NEGATIVE
Preg Test, Ur: NEGATIVE

## 2017-02-02 LAB — CHLAMYDIA/NGC RT PCR (ARMC ONLY)
CHLAMYDIA TR: NOT DETECTED
N GONORRHOEAE: NOT DETECTED

## 2017-02-02 LAB — LIPASE, BLOOD: LIPASE: 20 U/L (ref 11–51)

## 2017-02-02 MED ORDER — AZITHROMYCIN 500 MG PO TABS
2000.0000 mg | ORAL_TABLET | Freq: Once | ORAL | Status: AC
  Administered 2017-02-02: 2000 mg via ORAL
  Filled 2017-02-02: qty 4

## 2017-02-02 NOTE — ED Notes (Signed)
NAD noted at time of D/C. Pt denies questions or concerns. Pt ambulatory to the lobby at this time.  

## 2017-02-02 NOTE — ED Triage Notes (Signed)
Patient presents to the ED with lower abdominal pain x 6 days.  Patient denies nausea, vomiting and diarrhea.  Patient states, "I've been trying to just lay down and not pay it any attention but it's been getting worser."  Patient is in no obvious distress at this time.

## 2017-02-02 NOTE — ED Provider Notes (Signed)
[facility] Emergency Department Provider Note  ____________________________________________   First MD Initiated Contact with Patient 02/02/17 1016     (approximate)  I have reviewed the triage vital signs and the nursing notes.   HISTORY  Chief Complaint Abdominal Pain    HPI Alexis Gutierrez is a 26 y.o. female complains of lower abdominal pain, states she feels like something is moving around in there, denies fever or chills, denies constipation or diarrhea, she is sexually active, has unprotected sex with her boyfriend, states she does have some vaginal discharge,  milky to creamy colored, used to be on Depo-Provera, but switched to birth control pills in September, states she does take the pill regularly   Past Medical History:  Diagnosis Date  . Asthma     There are no active problems to display for this patient.   Past Surgical History:  Procedure Laterality Date  . TONSILLECTOMY      Prior to Admission medications   Not on File    Allergies Amoxicillin and Penicillins  No family history on file.  Social History Social History   Tobacco Use  . Smoking status: Current Some Day Smoker  Substance Use Topics  . Alcohol use: No  . Drug use: Not on file    Review of Systems  Constitutional: No fever/chills Eyes: No visual changes. ENT: No sore throat. Respiratory: Denies cough ABD: Positive Lower abdominal pain Genitourinary: Negative for dysuria. Positive for vaginal discharge Musculoskeletal: Negative for back pain. Skin: Negative for rash.    ____________________________________________   PHYSICAL EXAM:  VITAL SIGNS: ED Triage Vitals  Enc Vitals Group     BP 02/02/17 0828 134/68     Pulse Rate 02/02/17 0828 88     Resp 02/02/17 0828 16     Temp 02/02/17 0828 98.1 F (36.7 C)     Temp Source 02/02/17 0828 Oral     SpO2 02/02/17 0828 99 %     Weight 02/02/17 0828 (!) 318 lb (144.2 kg)     Height 02/02/17  0828 5\' 6"  (1.676 m)     Head Circumference --      Peak Flow --      Pain Score 02/02/17 0827 10     Pain Loc --      Pain Edu? --      Excl. in GC? --     Constitutional: Alert and oriented. Well appearing and in no acute distress. Eyes: Conjunctivae are normal.  Head: Atraumatic. Nose: No congestion/rhinnorhea. Mouth/Throat: Mucous membranes are moist.   Cardiovascular: Normal rate, regular rhythm. Respiratory: Normal respiratory effort.  No retractions, they're to auscultation ABD: Soft, mildly tender in the pelvic area, bowel sounds are normal all 4 quadrants GU: Exam showed milky white discharge, cervix is normal Musculoskeletal: FROM all extremities, warm and well perfused Neurologic:  Normal speech and language.  Skin:  Skin is warm, dry and intact. No rash noted. Psychiatric: Mood and affect are normal. Speech and behavior are normal.  ____________________________________________   LABS (all labs ordered are listed, but only abnormal results are displayed)  Labs Reviewed  WET PREP, GENITAL - Abnormal; Notable for the following components:      Result Value   WBC, Wet Prep HPF POC MODERATE (*)    All other components within normal limits  COMPREHENSIVE METABOLIC PANEL - Abnormal; Notable for the following components:   Glucose, Bld 100 (*)    AST 14 (*)    All other components  within normal limits  CBC - Abnormal; Notable for the following components:   RDW 14.8 (*)    All other components within normal limits  URINALYSIS, COMPLETE (UACMP) WITH MICROSCOPIC - Abnormal; Notable for the following components:   Color, Urine YELLOW (*)    APPearance HAZY (*)    Leukocytes, UA MODERATE (*)    Squamous Epithelial / LPF 6-30 (*)    All other components within normal limits  CHLAMYDIA/NGC RT PCR ([facility] ONLY)  LIPASE, BLOOD  POC URINE PREG, ED  POCT PREGNANCY, URINE  POCT PREGNANCY, URINE    ____________________________________________   ____________________________________________  RADIOLOGY    ____________________________________________   PROCEDURES  Procedure(s) performed: No      ____________________________________________   INITIAL IMPRESSION / ASSESSMENT AND PLAN / ED COURSE  Pertinent labs & imaging results that were available during my care of the patient were reviewed by me and considered in my medical decision making (see chart for details).  Patient is a 26 year old female complains of lower abdominal pain with vaginal discharge, urine showed moderate leuks, wet prep showed multiple white blood cells but no clue cells, yeast, or trichomoniasis, urine pregnant was negative, labs were normal, he was given azithromycin 2 g, she is allergic to penicillin which causes shortness of breath so we did not do Rocephin, explained to the patient that her gonorrhea and chlamydia test will not be back for several hours to the next day, a nurse will call her with the results, discuss safe sex, discussed proper hygiene, she is to return to the emergency department if she is worsening, otherwise follow-up with health department or her regular doctor      ____________________________________________   FINAL CLINICAL IMPRESSION(S) / ED DIAGNOSES  Final diagnoses:  Pelvic pain in female  STI (sexually transmitted infection)      NEW MEDICATIONS STARTED DURING THIS VISIT:  This SmartLink is deprecated. Use AVSMEDLIST instead to display the medication list for a patient.   Note:  This document was prepared using Dragon voice recognition software and may include unintentional dictation errors.    Faythe GheeFisher, Gianmarco Roye W, PA-C 02/02/17 1257    Dionne BucySiadecki, Sebastian, MD 02/02/17 1308

## 2017-02-02 NOTE — ED Triage Notes (Signed)
First Nurse Note:  Arrives with c/o abdominal pain/ pelvic pain x 6 days.  Ambulates with easy and steady gait.  Posture upright and relaxed.  NAD

## 2017-02-02 NOTE — Discharge Instructions (Signed)
On the regular doctor if you're not better in 3 days, he reviewed the medication to cover gonorrhea and chlamydia all year here in the emergency department, here test for gonorrhea and chlamydia have not yet been resulted, a nurse will be in touch with you to let you know the results in 3-5 days, if he develops fever or increased abdominal pain please return to the emergency department, your urine pregnancy test was negative

## 2017-10-29 ENCOUNTER — Emergency Department
Admission: EM | Admit: 2017-10-29 | Discharge: 2017-10-29 | Disposition: A | Discharge status: Home / Self Care | Payer: Medicaid Other | Attending: Emergency Medicine | Admitting: Emergency Medicine

## 2017-10-29 ENCOUNTER — Other Ambulatory Visit: Payer: Self-pay

## 2017-10-29 ENCOUNTER — Encounter: Payer: Self-pay | Admitting: Emergency Medicine

## 2017-10-29 DIAGNOSIS — N730 Acute parametritis and pelvic cellulitis: Secondary | ICD-10-CM

## 2017-10-29 DIAGNOSIS — N739 Female pelvic inflammatory disease, unspecified: Secondary | ICD-10-CM | POA: Diagnosis not present

## 2017-10-29 DIAGNOSIS — Z79899 Other long term (current) drug therapy: Secondary | ICD-10-CM | POA: Diagnosis not present

## 2017-10-29 DIAGNOSIS — J45909 Unspecified asthma, uncomplicated: Secondary | ICD-10-CM | POA: Insufficient documentation

## 2017-10-29 DIAGNOSIS — R102 Pelvic and perineal pain: Secondary | ICD-10-CM | POA: Diagnosis present

## 2017-10-29 DIAGNOSIS — Z87891 Personal history of nicotine dependence: Secondary | ICD-10-CM | POA: Insufficient documentation

## 2017-10-29 LAB — CBC
HCT: 39.2 % (ref 35.0–47.0)
Hemoglobin: 12.7 g/dL (ref 12.0–16.0)
MCH: 25.7 pg — AB (ref 26.0–34.0)
MCHC: 32.5 g/dL (ref 32.0–36.0)
MCV: 79.1 fL — AB (ref 80.0–100.0)
Platelets: 257 10*3/uL (ref 150–440)
RBC: 4.95 MIL/uL (ref 3.80–5.20)
RDW: 15.4 % — AB (ref 11.5–14.5)
WBC: 7.3 10*3/uL (ref 3.6–11.0)

## 2017-10-29 LAB — URINALYSIS, COMPLETE (UACMP) WITH MICROSCOPIC
Bilirubin Urine: NEGATIVE
Glucose, UA: NEGATIVE mg/dL
Hgb urine dipstick: NEGATIVE
Ketones, ur: NEGATIVE mg/dL
Nitrite: NEGATIVE
Protein, ur: NEGATIVE mg/dL
SPECIFIC GRAVITY, URINE: 1.025 (ref 1.005–1.030)
pH: 5 (ref 5.0–8.0)

## 2017-10-29 LAB — COMPREHENSIVE METABOLIC PANEL
ALT: 19 U/L (ref 0–44)
AST: 14 U/L — AB (ref 15–41)
Albumin: 3.8 g/dL (ref 3.5–5.0)
Alkaline Phosphatase: 42 U/L (ref 38–126)
Anion gap: 6 (ref 5–15)
BUN: 12 mg/dL (ref 6–20)
CHLORIDE: 106 mmol/L (ref 98–111)
CO2: 25 mmol/L (ref 22–32)
CREATININE: 0.67 mg/dL (ref 0.44–1.00)
Calcium: 9 mg/dL (ref 8.9–10.3)
GFR calc Af Amer: >60 mL/min (ref >60)
GFR calc non Af Amer: >60 mL/min (ref >60)
Glucose, Bld: 98 mg/dL (ref 70–99)
Potassium: 3.9 mmol/L (ref 3.5–5.1)
Sodium: 137 mmol/L (ref 135–145)
Total Bilirubin: 0.6 mg/dL (ref 0.3–1.2)
Total Protein: 7.2 g/dL (ref 6.5–8.1)

## 2017-10-29 LAB — WET PREP, GENITAL
CLUE CELLS WET PREP: NONE SEEN
SPERM: NONE SEEN
TRICH WET PREP: NONE SEEN
Yeast Wet Prep HPF POC: NONE SEEN

## 2017-10-29 LAB — CHLAMYDIA/NGC RT PCR (ARMC ONLY)
Chlamydia Tr: NOT DETECTED
N gonorrhoeae: NOT DETECTED

## 2017-10-29 LAB — POCT PREGNANCY, URINE: Preg Test, Ur: NEGATIVE

## 2017-10-29 LAB — LIPASE, BLOOD: Lipase: 22 U/L (ref 11–51)

## 2017-10-29 MED ORDER — CEFTRIAXONE SODIUM 250 MG IJ SOLR
250.0000 mg | Freq: Once | INTRAMUSCULAR | Status: AC
  Administered 2017-10-29: 250 mg via INTRAMUSCULAR
  Filled 2017-10-29: qty 250

## 2017-10-29 MED ORDER — ONDANSETRON 4 MG PO TBDP: 4.0000 mg | ORAL_TABLET | Freq: Three times a day (TID) | ORAL | 0 refills | Status: DC | PRN

## 2017-10-29 MED ORDER — DOXYCYCLINE HYCLATE 100 MG PO CAPS: 100.0000 mg | ORAL_CAPSULE | Freq: Two times a day (BID) | ORAL | 0 refills | Status: AC

## 2017-10-29 MED ORDER — DOXYCYCLINE HYCLATE 100 MG PO TABS
100.0000 mg | ORAL_TABLET | Freq: Once | ORAL | Status: AC
  Administered 2017-10-29: 100 mg via ORAL
  Filled 2017-10-29: qty 1

## 2017-10-29 MED ORDER — IBUPROFEN 600 MG PO TABS: 600.0000 mg | ORAL_TABLET | Freq: Four times a day (QID) | ORAL | 0 refills | Status: DC | PRN

## 2017-10-29 MED ORDER — IBUPROFEN 600 MG PO TABS
600.0000 mg | ORAL_TABLET | Freq: Once | ORAL | Status: AC
  Administered 2017-10-29: 600 mg via ORAL
  Filled 2017-10-29: qty 1

## 2017-10-29 NOTE — ED Notes (Signed)
.   Pt is resting, Respirations even and unlabored, NAD. Stretcher lowest postion and locked. Call bell within reach. Denies any needs at this time RN will continue to monitor.    

## 2017-10-29 NOTE — ED Notes (Signed)

## 2017-10-29 NOTE — ED Triage Notes (Signed)
Pt to ED via POV c/o lower abdominal pain that started yesterday. Pt also reports nausea and urinary frequency. Pt denies any other symptoms at this time.

## 2017-10-29 NOTE — ED Provider Notes (Signed)
[facility] Emergency Department Provider Note  ____________________________________________  Time seen: Approximately 10:51 AM  I have reviewed the triage vital signs and the nursing notes.   HISTORY  Chief Complaint Abdominal Pain   HPI Alexis Gutierrez is a 27 y.o. female the history of prior STDs who presents for evaluation of pelvic pain.  Patient reports having unprotected sex.  Has had 2 days of pelvic pain that she describes as pressure in her vagina.  She also reports urinary frequency.  No dysuria, no vaginal discharge, no abdominal pain, no fever or chills.  She has had mild nausea.  She reports that her pain is 10 out of 10.  Has not taken anything at home.  Past Medical History:  Diagnosis Date  . Asthma     There are no active problems to display for this patient.   Past Surgical History:  Procedure Laterality Date  . TONSILLECTOMY      Prior to Admission medications   Medication Sig Start Date End Date Taking? Authorizing Provider  buPROPion HCl (WELLBUTRIN PO) Take 1 tablet by mouth daily.   Yes [provider]  SERTRALINE HCL PO Take 1 tablet by mouth daily.   Yes [provider]  doxycycline (VIBRAMYCIN) 100 MG capsule Take 1 capsule (100 mg total) by mouth 2 (two) times daily for 7 days. 10/29/17 11/05/17  Nita Sickle, MD  ibuprofen (ADVIL,MOTRIN) 600 MG tablet Take 1 tablet (600 mg total) by mouth every 6 (six) hours as needed. 10/29/17   Nita Sickle, MD  ondansetron (ZOFRAN ODT) 4 MG disintegrating tablet Take 1 tablet (4 mg total) by mouth every 8 (eight) hours as needed for nausea or vomiting. 10/29/17   Nita Sickle, MD    Allergies Amoxicillin and Penicillins  No family history on file.  Social History Social History   Tobacco Use  . Smoking status: Former Games developer  . Smokeless tobacco: Never Used  Substance Use Topics  . Alcohol use: No  . Drug use: Not Currently    Review of  Systems  Constitutional: Negative for fever. Eyes: Negative for visual changes. ENT: Negative for sore throat. Neck: No neck pain  Cardiovascular: Negative for chest pain. Respiratory: Negative for shortness of breath. Gastrointestinal: Negative for abdominal pain, vomiting or diarrhea. Genitourinary: Negative for dysuria. + vaginal pain Musculoskeletal: Negative for back pain. Skin: Negative for rash. Neurological: Negative for headaches, weakness or numbness. Psych: No SI or HI  ____________________________________________   PHYSICAL EXAM:  VITAL SIGNS: ED Triage Vitals [10/29/17 0943]  Enc Vitals Group     BP 136/80     Pulse Rate 90     Resp 16     Temp 98.9 F (37.2 C)     Temp Source Oral     SpO2 97 %     Weight (!) 317 lb 14.5 oz (144.2 kg)     Height      Head Circumference      Peak Flow      Pain Score 10     Pain Loc      Pain Edu?      Excl. in GC?     Constitutional: Alert and oriented. Well appearing and in no apparent distress. HEENT:      Head: Normocephalic and atraumatic.         Eyes: Conjunctivae are normal. Sclera is non-icteric.       Mouth/Throat: Mucous membranes are moist.       Neck:  Supple with no signs of meningismus. Cardiovascular: Regular rate and rhythm. No murmurs, gallops, or rubs. 2+ symmetrical distal pulses are present in all extremities. No JVD. Respiratory: Normal respiratory effort. Lungs are clear to auscultation bilaterally. No wheezes, crackles, or rhonchi.  Gastrointestinal: Soft, non tender, and non distended with positive bowel sounds. No rebound or guarding. Genitourinary: No CVA tenderness. Pelvic exam: Normal external genitalia, no rashes or lesions. Normal cervical mucus. Os closed. cervical motion tenderness.  No uterine or adnexal tenderness.   Musculoskeletal: Nontender with normal range of motion in all extremities. No edema, cyanosis, or erythema of extremities. Neurologic: Normal speech and language. Face is  symmetric. Moving all extremities. No gross focal neurologic deficits are appreciated. Skin: Skin is warm, dry and intact. No rash noted. Psychiatric: Mood and affect are normal. Speech and behavior are normal.  ____________________________________________   LABS (all labs ordered are listed, but only abnormal results are displayed)  Labs Reviewed  WET PREP, GENITAL - Abnormal; Notable for the following components:      Result Value   WBC, Wet Prep HPF POC MODERATE (*)    All other components within normal limits  COMPREHENSIVE METABOLIC PANEL - Abnormal; Notable for the following components:   AST 14 (*)    All other components within normal limits  CBC - Abnormal; Notable for the following components:   MCV 79.1 (*)    MCH 25.7 (*)    RDW 15.4 (*)    All other components within normal limits  URINALYSIS, COMPLETE (UACMP) WITH MICROSCOPIC - Abnormal; Notable for the following components:   Color, Urine YELLOW (*)    APPearance CLOUDY (*)    Leukocytes, UA MODERATE (*)    Bacteria, UA RARE (*)    All other components within normal limits  CHLAMYDIA/NGC RT PCR ([facility] ONLY)  URINE CULTURE  LIPASE, BLOOD  POC URINE PREG, ED  POCT PREGNANCY, URINE   ____________________________________________  EKG  none  ____________________________________________  RADIOLOGY  none  ____________________________________________   PROCEDURES  Procedure(s) performed: None Procedures Critical Care performed:  None ____________________________________________   INITIAL IMPRESSION / ASSESSMENT AND PLAN / ED COURSE   27 y.o. female the history of prior STDs who presents for evaluation of pelvic pain x 2 days that started after patient had unprotected sex.  Also complained of urinary frequency.  Urinalysis showing rare bacteria with lots of WBCs but negative nitrites.  Possibly UTI versus STD.  Patient did have CMT on exam therefore will treat for PID.  No adnexal tenderness,  normal white count, no fever, with no clinical signs or symptoms of tubo-ovarian abscess at this time.  Labs are otherwise within normal limits.  Pregnancy test is negative.    _________________________ 12:26 PM on 10/29/2017 -----------------------------------------  Wet prep negative. Gc/chlamydia pending however based on CMT will treat for PID.  Patient received IM Rocephin and was started on doxycycline.  Patient reports feeling nauseated after taking ibuprofen so we will give her a prescription for Zofran, ibuprofen, and doxycycline.  Discussed testing of the sexual partners.  Discussed return precautions for fever, worsening vaginal pain, abdominal pain.  Discussed close follow-up with primary care doctor  As part of my medical decision making, I reviewed the following data within the electronic MEDICAL RECORD NUMBER Nursing notes reviewed and incorporated, Labs reviewed , Old chart reviewed, Notes from prior ED visits and [place] Controlled Substance Database    Pertinent labs & imaging results that were available during my care of the patient  were reviewed by me and considered in my medical decision making (see chart for details).    ____________________________________________   FINAL CLINICAL IMPRESSION(S) / ED DIAGNOSES  Final diagnoses:  PID (acute pelvic inflammatory disease)      NEW MEDICATIONS STARTED DURING THIS VISIT:  ED Discharge Orders         Ordered    ondansetron (ZOFRAN ODT) 4 MG disintegrating tablet  Every 8 hours PRN     10/29/17 1226    doxycycline (VIBRAMYCIN) 100 MG capsule  2 times daily     10/29/17 1226    ibuprofen (ADVIL,MOTRIN) 600 MG tablet  Every 6 hours PRN     10/29/17 1226           Note:  This document was prepared using Dragon voice recognition software and may include unintentional dictation errors.    Don PerkingVeronese, [place], MD 10/29/17 [phone]

## 2017-10-29 NOTE — ED Notes (Signed)
Pt states her lower abd pain started yesterday. Pt states her LMP was 8/19-8/20. Pt states it is a cramping style pain that is constant. Pt states last intercourse was 10/26/17. Pt states " My body is preparing for another child. I have another child and it feels like I have conceived. I am worried about a miscarriage." pt has a 29 yr old that was a normal vaginal delivery.

## 2017-10-31 LAB — URINE CULTURE

## 2017-12-15 ENCOUNTER — Other Ambulatory Visit (INDEPENDENT_AMBULATORY_CARE_PROVIDER_SITE_OTHER): Payer: Medicaid Other

## 2017-12-15 ENCOUNTER — Other Ambulatory Visit (HOSPITAL_COMMUNITY)
Admission: RE | Admit: 2017-12-15 | Discharge: 2017-12-15 | Disposition: A | Discharge status: Home / Self Care | Payer: Medicaid Other | Source: Ambulatory Visit | Attending: Obstetrics and Gynecology | Admitting: Obstetrics and Gynecology

## 2017-12-15 ENCOUNTER — Ambulatory Visit (INDEPENDENT_AMBULATORY_CARE_PROVIDER_SITE_OTHER): Payer: Medicaid Other | Admitting: Obstetrics and Gynecology

## 2017-12-15 ENCOUNTER — Encounter: Payer: Self-pay | Admitting: Obstetrics and Gynecology

## 2017-12-15 VITALS — BP 122/70 | Wt 322.0 lb

## 2017-12-15 DIAGNOSIS — Z3A08 8 weeks gestation of pregnancy: Secondary | ICD-10-CM | POA: Diagnosis not present

## 2017-12-15 DIAGNOSIS — N83201 Unspecified ovarian cyst, right side: Secondary | ICD-10-CM | POA: Diagnosis not present

## 2017-12-15 DIAGNOSIS — O3481 Maternal care for other abnormalities of pelvic organs, first trimester: Secondary | ICD-10-CM | POA: Diagnosis not present

## 2017-12-15 DIAGNOSIS — G4730 Sleep apnea, unspecified: Secondary | ICD-10-CM

## 2017-12-15 DIAGNOSIS — Z6841 Body Mass Index (BMI) 40.0 and over, adult: Secondary | ICD-10-CM

## 2017-12-15 DIAGNOSIS — O0991 Supervision of high risk pregnancy, unspecified, first trimester: Secondary | ICD-10-CM | POA: Insufficient documentation

## 2017-12-15 DIAGNOSIS — O208 Other hemorrhage in early pregnancy: Secondary | ICD-10-CM | POA: Diagnosis not present

## 2017-12-15 DIAGNOSIS — Z23 Encounter for immunization: Secondary | ICD-10-CM | POA: Diagnosis not present

## 2017-12-15 DIAGNOSIS — Z124 Encounter for screening for malignant neoplasm of cervix: Secondary | ICD-10-CM

## 2017-12-15 DIAGNOSIS — O0990 Supervision of high risk pregnancy, unspecified, unspecified trimester: Secondary | ICD-10-CM

## 2017-12-15 DIAGNOSIS — N83202 Unspecified ovarian cyst, left side: Secondary | ICD-10-CM

## 2017-12-15 NOTE — Progress Notes (Signed)
NOB C/o some nausea no vomiting, heartburn, fatigue Wants flu shot but is allergic to eggs

## 2017-12-15 NOTE — Progress Notes (Signed)
05/12/2020   Delayed chart closure. Items in not may not be factual.  Chief Complaint: Missed period  Transfer of Care Patient: no  History of Present Illness: Alexis Gutierrez is a 27 y.o. [id number] [redacted]w[redacted]d based on [name] last menstrual period was 10/19/2017 (within days). with an Estimated Date of Delivery: 07/22/18, with the above CC.   Her periods were: regular She has Negative signs or symptoms of nausea/vomiting of pregnancy. She has Negative signs or symptoms of miscarriage or preterm labor She was not taking different medications around the time she conceived/early pregnancy. Since her LMP, she has not used alcohol Since her LMP, she has not used tobacco products Since her LMP, she has not used illegal drugs.    Current or past history of domestic violence. no  Infection History:  1. Since her LMP, she has not had a viral illness.  2. She denies close contact with children on a regular basis     3. She denies a history of chicken pox. She denies vaccination for chicken pox in the past. 4. Patient or partner has history of genital herpes  no 5. History of STI (GC, CT, HPV, syphilis, HIV)  no   6.  She does not live with someone with TB or TB exposed. 7. History of recent travel :  no 8. She identifies Negative Zika risk factors for her and her partner 22. There are not cats in the home in the home.  She understands that while pregnant she should not change cat litter.   Genetic Screening Questions: (Includes patient, baby's father, or anyone in either family)   1. [name] age >/= 60 at [facility]  no 2. Thalassemia ([place], [place], Mediterranean, or Asian background): MCV<80  no 3. Neural tube defect (meningomyelocele, spina bifida, anencephaly)  no 4. Congenital heart defect  no  5. Down syndrome  no 6. Tay-Sachs (Jewish, [place]  no 7. Canavan's Disease  no 8. Sickle cell disease or trait (African)  no  9. Hemophilia or other blood disorders  no  10. Muscular dystrophy   no  11. Cystic fibrosis  no  12. Huntington's Chorea  no  13. Mental retardation/autism  no 14. Other inherited genetic or chromosomal disorder  no 15. Maternal metabolic disorder (DM, PKU, etc)  no 16. Patient or FOB with a child with a birth defect not listed above no  16a. Patient or FOB with a birth defect themselves no 17. Recurrent pregnancy loss, or stillbirth  no  18. Any medications since LMP other than prenatal vitamins (include vitamins, supplements, OTC meds, drugs, alcohol)  no 19. Any other genetic/environmental exposure to discuss  no  ROS:  ROS  OBGYN History: As per HPI. OB History  Gravida Para Term Preterm AB Living  2 2 2     2   SAB IAB Ectopic Multiple Live Births        0 2    # Outcome Date GA Lbr Len/2nd Weight Sex Delivery Anes PTL Lv  2 Term 07/25/18 [redacted]w[redacted]d 03:20 / 01:25 6 lb 15.8 oz (3.17 kg) M Vag-Spont EPI  LIV  1 Term 02/10/11 [redacted]w[redacted]d  7 lb 7 oz (3.374 kg) F Vag-Spont   LIV    Any issues with any prior pregnancies: no Any prior children are healthy, doing well, without any problems or issues: no   Past Medical History: Past Medical History:  Diagnosis Date  . Anxiety   . Asthma   . Depression     Past  Surgical History: Past Surgical History:  Procedure Laterality Date  . TONSILLECTOMY      Family History:  Family History  Problem Relation Age of Onset  . Breast cancer Paternal Aunt        26s   She denies any female cancers, bleeding or blood clotting disorders.   Social History:  Social History   Socioeconomic History  . Marital status: Married    Spouse name: Not on file  . Number of children: Not on file  . Years of education: Not on file  . Highest education level: Not on file  Occupational History  . Not on file  Tobacco Use  . Smoking status: Former Games developer  . Smokeless tobacco: Never Used  Vaping Use  . Vaping Use: Never used  Substance and Sexual Activity  . Alcohol use: No  . Drug use: Not Currently  . Sexual  activity: Yes    Birth control/protection: None  Other Topics Concern  . Not on file  Social History Narrative   Husband in [employer] 2020   Social Determinants of Health   Financial Resource Strain: Not on file  Food Insecurity: Not on file  Transportation Needs: Not on file  Physical Activity: Not on file  Stress: Not on file  Social Connections: Not on file  Intimate Partner Violence: Not on file    Allergy: Allergies  Allergen Reactions  . Amoxicillin Shortness Of Breath    Has patient had a PCN reaction causing immediate rash, facial/tongue/throat swelling, SOB or lightheadedness with hypotension: Yes Has patient had a PCN reaction causing severe rash involving mucus membranes or skin necrosis: No Has patient had a PCN reaction that required hospitalization: No Has patient had a PCN reaction occurring within the last 10 years: No If all of the above answers are "NO", then may proceed with Cephalosporin use.  . Eggs Or Egg-Derived Products Other (See Comments)    Acid reflux  . Penicillins Swelling    Has patient had a PCN reaction causing immediate rash, facial/tongue/throat swelling, SOB or lightheadedness with hypotension: Yes Has patient had a PCN reaction causing severe rash involving mucus membranes or skin necrosis: No Has patient had a PCN reaction that required hospitalization: No Has patient had a PCN reaction occurring within the last 10 years: No If all of the above answers are "NO", then may proceed with Cephalosporin use.   . Tomato Other (See Comments)    Acid reflux     Current Outpatient Medications:  Current Outpatient Medications:  .  escitalopram (LEXAPRO) 20 MG tablet, Take 1 tablet (20 mg total) by mouth daily., Disp: 30 tablet, Rfl: 0 .  norethindrone (MICRONOR) 0.35 MG tablet, Take 1 tablet (0.35 mg total) by mouth daily., Disp: 84 tablet, Rfl: 1   Physical Exam: Physical Exam   Assessment: Alexis Gutierrez is a 27 y.o. [id number] [redacted]w[redacted]d based on  [name] last menstrual period was 10/19/2017 (within days). with an Estimated Date of Delivery: 07/22/18,  for prenatal care.  Plan:  1) Avoid alcoholic beverages. 2) Patient encouraged not to smoke.  3) Discontinue the use of all non-medicinal drugs and chemicals.  4) Take prenatal vitamins daily.  5) Seatbelt use advised 6) Nutrition, food safety (fish, cheese advisories, and high nitrite foods) and exercise discussed. 7) Hospital and practice style delivering at [facility] discussed  8) Patient is asked about travel to areas at risk for the Zika virus, and counseled to avoid travel and exposure to mosquitoes or sexual partners who  may have themselves been exposed to the virus. Testing is discussed, and will be ordered as appropriate.  9) Childbirth classes at [facility] advised 10) Genetic Screening, such as with 1st Trimester Screening, cell free fetal DNA, AFP testing, and Ultrasound, as well as with amniocentesis and CVS as appropriate, is discussed with patient.  Problem list reviewed and updated.  I discussed the assessment and treatment plan with the patient. The patient was provided an opportunity to ask questions and all were answered. The patient agreed with the plan and demonstrated an understanding of the instructions.  Adelene Idler MD [facility], [facility] 05/12/2020 7:51 AM  Delayed chart closure. Items in not may not be factual.

## 2017-12-16 LAB — RPR+RH+ABO+RUB AB+AB SCR+CB...
Antibody Screen: NEGATIVE
HEP B S AG: NEGATIVE
HIV Screen 4th Generation wRfx: NONREACTIVE
Hematocrit: 39.4 % (ref 34.0–46.6)
Hemoglobin: 12.3 g/dL (ref 11.1–15.9)
MCH: 24.9 pg — ABNORMAL LOW (ref 26.6–33.0)
MCHC: 31.2 g/dL — AB (ref 31.5–35.7)
MCV: 80 fL (ref 79–97)
PLATELETS: 283 10*3/uL (ref 150–450)
RBC: 4.94 x10E6/uL (ref 3.77–5.28)
RDW: 14 % (ref 12.3–15.4)
RH TYPE: POSITIVE
RPR: NONREACTIVE
Rubella Antibodies, IGG: 3.7 index (ref >0.99)
Varicella zoster IgG: 3030 index (ref >165)
WBC: 7.7 10*3/uL (ref 3.4–10.8)

## 2017-12-16 LAB — MONITOR DRUG PROFILE 10(MW)
AMPHETAMINE SCREEN URINE: NEGATIVE ng/mL
BARBITURATE SCREEN URINE: NEGATIVE ng/mL
BENZODIAZEPINE SCREEN, URINE: NEGATIVE ng/mL
CANNABINOIDS UR QL SCN: NEGATIVE ng/mL
COCAINE(METAB.)SCREEN, URINE: NEGATIVE ng/mL
CREATININE(CRT), U: 233.8 mg/dL (ref 20.0–300.0)
METHADONE SCREEN, URINE: NEGATIVE ng/mL
OPIATE SCREEN URINE: NEGATIVE ng/mL
OXYCODONE+OXYMORPHONE UR QL SCN: NEGATIVE ng/mL
PHENCYCLIDINE QUANTITATIVE URINE: NEGATIVE ng/mL
PROPOXYPHENE SCREEN URINE: NEGATIVE ng/mL
Ph of Urine: 6.3 (ref 4.5–8.9)

## 2017-12-16 LAB — HEMOGLOBIN A1C
ESTIMATED AVERAGE GLUCOSE: 105 mg/dL
Hgb A1c MFr Bld: 5.3 % (ref 4.8–5.6)

## 2017-12-17 LAB — URINE CULTURE

## 2017-12-20 LAB — CYTOLOGY - PAP
Chlamydia: POSITIVE — AB
Diagnosis: NEGATIVE
Neisseria Gonorrhea: NEGATIVE
Trichomonas: NEGATIVE

## 2017-12-21 ENCOUNTER — Other Ambulatory Visit: Payer: Self-pay | Admitting: Obstetrics and Gynecology

## 2017-12-21 DIAGNOSIS — A749 Chlamydial infection, unspecified: Secondary | ICD-10-CM

## 2017-12-21 MED ORDER — AZITHROMYCIN 500 MG PO TABS: 1000.0000 mg | ORAL_TABLET | Freq: Once | ORAL | 0 refills | Status: AC

## 2017-12-21 NOTE — Telephone Encounter (Signed)
For your review

## 2017-12-21 NOTE — Telephone Encounter (Signed)
Please send copy to her PCP.

## 2017-12-21 NOTE — Progress Notes (Signed)
Chlamydia positive. Discussed with patient on phone, results released to mychart. Prescription sent to pharmacy. Will need toc in 1 month.

## 2017-12-22 ENCOUNTER — Other Ambulatory Visit: Payer: Medicaid Other

## 2017-12-22 ENCOUNTER — Encounter: Payer: Medicaid Other | Admitting: Maternal Newborn

## 2017-12-22 NOTE — Telephone Encounter (Signed)
For your review

## 2017-12-27 ENCOUNTER — Other Ambulatory Visit: Payer: Medicaid Other

## 2017-12-27 ENCOUNTER — Ambulatory Visit (INDEPENDENT_AMBULATORY_CARE_PROVIDER_SITE_OTHER): Payer: Medicaid Other | Admitting: Certified Nurse Midwife

## 2017-12-27 ENCOUNTER — Encounter: Payer: Self-pay | Admitting: Certified Nurse Midwife

## 2017-12-27 VITALS — BP 118/60 | Wt 319.5 lb

## 2017-12-27 DIAGNOSIS — O99210 Obesity complicating pregnancy, unspecified trimester: Secondary | ICD-10-CM | POA: Insufficient documentation

## 2017-12-27 DIAGNOSIS — O99211 Obesity complicating pregnancy, first trimester: Secondary | ICD-10-CM

## 2017-12-27 DIAGNOSIS — Z6841 Body Mass Index (BMI) 40.0 and over, adult: Secondary | ICD-10-CM

## 2017-12-27 DIAGNOSIS — N83209 Unspecified ovarian cyst, unspecified side: Secondary | ICD-10-CM | POA: Insufficient documentation

## 2017-12-27 DIAGNOSIS — O0990 Supervision of high risk pregnancy, unspecified, unspecified trimester: Secondary | ICD-10-CM

## 2017-12-27 DIAGNOSIS — Z3A10 10 weeks gestation of pregnancy: Secondary | ICD-10-CM

## 2017-12-27 DIAGNOSIS — O98811 Other maternal infectious and parasitic diseases complicating pregnancy, first trimester: Secondary | ICD-10-CM

## 2017-12-27 DIAGNOSIS — A749 Chlamydial infection, unspecified: Secondary | ICD-10-CM | POA: Insufficient documentation

## 2017-12-27 DIAGNOSIS — O3481 Maternal care for other abnormalities of pelvic organs, first trimester: Secondary | ICD-10-CM

## 2017-12-27 HISTORY — DX: Supervision of high risk pregnancy, unspecified, unspecified trimester: O09.90

## 2017-12-27 LAB — POCT URINALYSIS DIPSTICK OB: Glucose, UA: NEGATIVE

## 2017-12-27 NOTE — Progress Notes (Signed)
No concerns.rj 

## 2017-12-27 NOTE — Progress Notes (Signed)
HROB visit at 10wk3days: BMI>50, 8 x 5.7cm left ovarian cyst, +Chlamydia this pregnancy EDC changed to reflect 10/10 ultrasound since [facility] was a estimate.  Has had no spotting since her last visit Kept down her Azithromycin (had a little stomach ache) Having her 1 hour GTT today. +FHTs on ultrasound at 168 BPM Ultrasound to follow up on left ovarian cyst, Matern[place] 21, hemoglobin electrophoresis in 2 weeks. Remind to start baby ASA after next visit  Farrel Conners, CNM

## 2017-12-28 LAB — GLUCOSE TOLERANCE, 1 HOUR: Glucose, 1Hr PP: 91 mg/dL (ref 65–199)

## 2018-01-10 ENCOUNTER — Encounter: Payer: Self-pay | Admitting: Dietician

## 2018-01-10 ENCOUNTER — Encounter: Payer: Medicaid Other | Attending: Family Medicine | Admitting: Dietician

## 2018-01-10 VITALS — Ht 66.0 in | Wt 321.3 lb

## 2018-01-10 DIAGNOSIS — O0991 Supervision of high risk pregnancy, unspecified, first trimester: Secondary | ICD-10-CM | POA: Diagnosis not present

## 2018-01-10 DIAGNOSIS — Z713 Dietary counseling and surveillance: Secondary | ICD-10-CM | POA: Insufficient documentation

## 2018-01-10 DIAGNOSIS — Z6841 Body Mass Index (BMI) 40.0 and over, adult: Secondary | ICD-10-CM

## 2018-01-10 DIAGNOSIS — O0990 Supervision of high risk pregnancy, unspecified, unspecified trimester: Secondary | ICD-10-CM | POA: Diagnosis present

## 2018-01-10 DIAGNOSIS — Z3A12 12 weeks gestation of pregnancy: Secondary | ICD-10-CM

## 2018-01-10 DIAGNOSIS — Z3A08 8 weeks gestation of pregnancy: Secondary | ICD-10-CM | POA: Insufficient documentation

## 2018-01-10 NOTE — Patient Instructions (Signed)
   Try some frozen vegetables or canned (dump out the liquid and rinse/ heat up with fresh water). Include vegetables 2-4 times a day; eat large portions.   Keep eating fruit for snacks or with meals, 2-4 times a day.   Make sure to eat some protein + a starch + a vegetable and/or a fruit with every meal.  If you are not hungry for a meal, at least have a protein or breakfast shake, such as [employer], or Atkins or Premier protein shake.   Include some light exercise as able. A stationary bike or a rowing machine would prevent pressure on knees, hip, and back.

## 2018-01-10 NOTE — Progress Notes (Signed)
Medical Nutrition Therapy: Visit start time: 1100  end time: 1200  Assessment:  Diagnosis: obesity with pregnancy Past medical history: GERD,  Psychosocial issues/ stress concerns: anxiety, depression  Preferred learning method:  . Auditory . Visual  Current weight: 321.3lbs  Height: 5'6" Medications, supplements: prenatal vitamins only at this time.   Progress and evaluation: patient reports weight of 325lbs prior to pregnancy. She experienced nausea and heartburn at the start of gestation, but now improved. Boyfriend has been helping with weight control. Patient reports eating mostly meat recently, has had issue with dishwasher that made mildew form on dishes, so boyfriend threw away, and patient is now struggling with having enough money to replace dishes and afford a variety of food. Reports allergy to eggs and tomatoes (heartburn reaction). She states she gained 91lbs during her first pregnancy.    Physical activity: none  Dietary Intake:  Usual eating pattern includes 3 meals and 1 snacks per day. Dining out frequency: 1-2 meals per week.  Breakfast: rice, 2 hot dogs; often low appetite and will eat only fruit ie apple Snack: none Lunch: pasta (<1 cup)with meatball/(<3oz) Snack: apple Supper: 11/4 [employer] beef and cheddar sandwich with fries and tea (first time drinking tea during pregnancy) Snack: none Beverages: mostly water  Nutrition Care Education: Topics covered: weight control during pregnancy Basic nutrition: basic food groups, appropriate nutrient balance, appropriate meal and snack schedule, general nutrition guidelines; low cost food options Weight control: goal of weight maintenance or minimal weight gain during pregnancy; benefits of weight loss after pregnancy; importance of limiting high fat and high sugar foods; appropriate food portions and balance; role of physical activity Pregnancy nutrition: importance of adequate nutrition including protein, and vegetables  and fruits; options for nutritious intake when appetite is decreased.   Nutritional Diagnosis:  [place]-3.3 Overweight/obesity As related to history of excess calories, inactivity.  As evidenced by patient with BMI of 51.8, in first trimester of pregnancy.  Intervention: Instruction as noted above.   Set goals to eat balanced meals and increase vegetables and fruits.    Patient is eating appropriately at this time, has made effort to control food intake, with support from her boyfriend.      Education Materials given:  . Plate Planner with food lists . Sample meal pattern/ menus . Snacking handout . Goals/ instructions   Learner/ who was taught:  . Patient    Level of understanding: . Partial understanding; needs review/ practice  Demonstrated degree of understanding via:   Teach back Learning barriers: . None   Willingness to learn/ readiness for change: . Eager, change in progress   Monitoring and Evaluation:  Dietary intake, exercise, and body weight      follow up: 02/06/18

## 2018-01-11 ENCOUNTER — Ambulatory Visit (INDEPENDENT_AMBULATORY_CARE_PROVIDER_SITE_OTHER): Payer: Medicaid Other | Admitting: Maternal Newborn

## 2018-01-11 ENCOUNTER — Ambulatory Visit (INDEPENDENT_AMBULATORY_CARE_PROVIDER_SITE_OTHER): Payer: Medicaid Other

## 2018-01-11 ENCOUNTER — Encounter: Payer: Self-pay | Admitting: Dietician

## 2018-01-11 ENCOUNTER — Encounter: Payer: Self-pay | Admitting: Maternal Newborn

## 2018-01-11 VITALS — BP 110/64 | Wt 318.0 lb

## 2018-01-11 DIAGNOSIS — Z3A12 12 weeks gestation of pregnancy: Secondary | ICD-10-CM

## 2018-01-11 DIAGNOSIS — N83209 Unspecified ovarian cyst, unspecified side: Secondary | ICD-10-CM

## 2018-01-11 DIAGNOSIS — N83202 Unspecified ovarian cyst, left side: Secondary | ICD-10-CM

## 2018-01-11 DIAGNOSIS — O3481 Maternal care for other abnormalities of pelvic organs, first trimester: Secondary | ICD-10-CM | POA: Diagnosis not present

## 2018-01-11 DIAGNOSIS — Z1379 Encounter for other screening for genetic and chromosomal anomalies: Secondary | ICD-10-CM

## 2018-01-11 DIAGNOSIS — O99211 Obesity complicating pregnancy, first trimester: Secondary | ICD-10-CM

## 2018-01-11 DIAGNOSIS — O99210 Obesity complicating pregnancy, unspecified trimester: Secondary | ICD-10-CM

## 2018-01-11 DIAGNOSIS — O0990 Supervision of high risk pregnancy, unspecified, unspecified trimester: Secondary | ICD-10-CM

## 2018-01-11 DIAGNOSIS — N83201 Unspecified ovarian cyst, right side: Secondary | ICD-10-CM | POA: Diagnosis not present

## 2018-01-11 LAB — POCT URINALYSIS DIPSTICK OB: GLUCOSE, UA: NEGATIVE

## 2018-01-11 NOTE — Progress Notes (Signed)
ROB Ultrasound today Ovarian cyst

## 2018-01-11 NOTE — Progress Notes (Signed)
    Routine Prenatal Care Visit  Subjective  Alexis Gutierrez is a 27 y.o. G2P1001 at [redacted]w[redacted]d being seen today for ongoing prenatal care.  She is currently monitored for the following issues for this high-risk pregnancy and has Supervision of high risk pregnancy, antepartum; Obesity in pregnancy; Morbid obesity with BMI of 50.0-59.9, adult (HCC); Chlamydia infection affecting pregnancy in first trimester; and Ovarian cyst affecting pregnancy in first trimester, antepartum on their problem list.  ----------------------------------------------------------------------------------- Patient reports no complaints.   Vag. Bleeding: None.  ----------------------------------------------------------------------------------- The following portions of the patient's history were reviewed and updated as appropriate: allergies, current medications, past family history, past medical history, past social history, past surgical history and problem list. Problem list updated.   Objective  Blood pressure 110/64, weight (!) 318 lb (144.2 kg), last menstrual period 10/19/2017. Pregravid weight 327 lb (148.3 kg) Total Weight Gain -9 lb (-4.082 kg) Body mass index is 51.33 kg/m.   Urinalysis: Protein Trace, Glucose Negative  Fetal Status: Fetal Heart Rate (bpm): 163         General:  Alert, oriented and cooperative. Patient is in no acute distress.  Skin: Skin is warm and dry. No rash noted.   Cardiovascular: Normal heart rate noted  Respiratory: Normal respiratory effort, no problems with respiration noted  Abdomen: Soft, gravid, appropriate for gestational age. Pain/Pressure: Absent     Pelvic:  Cervical exam deferred        Extremities: Normal range of motion.     Mental Status: Normal mood and affect. Normal behavior. Normal judgment and thought content.     Assessment   27 y.o. G2P1001 at [redacted]w[redacted]d, EDD 07/22/2018 by Ultrasound presenting for a routine prenatal visit.  Plan   Pregnancy #2 Problems (from  10/19/17 to present)    Problem Noted Resolved   Supervision of high risk pregnancy, antepartum 12/27/2017 by Farrel Conners, CNM No   Overview Signed 12/27/2017  5:47 PM by Farrel Conners, CNM    Clinic [facility] Prenatal Labs  Dating 8wk5d ultrasound Blood type: A/Positive/-- (10/10 1003)   Genetic Screen 1 Screen:    AFP:     Quad:     NIPS: Antibody:Negative (10/10 1003)  Anatomic [place]  Rubella: 3.70 (10/10 1003) Varicella: Immune  GTT Early:               Third trimester:  RPR: Non Reactive (10/10 1003)   Rhogam  HBsAg: Negative (10/10 1003)   TDaP vaccine                       Flu Shot: HIV: Non Reactive (10/10 1003)   Baby Food                                GBS:   Contraception  Pap:  CBB   Chlamydia positive 12/15/17  CS/VBAC    Support Person            Ultrasound today showed that bilateral ovarian cysts are still present, as seen on imaging on 12/15/17, but the left cyst which previously measured 8 cm is significantly smaller and now measures 2.5 x 1.3 x 1.6 cm.  Discussed consult to anesthesia and that she will be contacted for an appointment.  Please refer to After Visit Summary for other counseling recommendations.   Return in about 4 weeks (around 02/08/2018) for ROB.  Marcelyn Bruins, CNM 01/11/2018  1:21 PM

## 2018-01-11 NOTE — Progress Notes (Signed)
Sent copy of letter from yesterday's visit (01/10/18) to referring provider; letter was originally sent to patient's PCP, who was not the referring provider.

## 2018-01-13 LAB — HEMOGLOBINOPATHY EVALUATION
HGB C: 0 %
HGB S: 0 %
HGB VARIANT: 0 %
Hemoglobin A2 Quantitation: 2.2 % (ref 1.8–3.2)
Hemoglobin F Quantitation: 0 % (ref 0.0–2.0)
Hgb A: 97.8 % (ref 96.4–98.8)

## 2018-01-16 ENCOUNTER — Telehealth: Payer: Self-pay | Admitting: Maternal Newborn

## 2018-01-16 NOTE — Telephone Encounter (Signed)
Patient is aware of anesthesia consultation on Thursday, 01/26/18 @ 12:00pm.  Patient is aware to arrive at [facility], first office on the right. Patient had already seen the appointment in MyChart when I called.

## 2018-01-16 NOTE — Patient Instructions (Signed)

## 2018-01-19 LAB — MATERNIT 21 PLUS CORE, BLOOD
CHROMOSOME 21: NEGATIVE
Chromosome 13: NEGATIVE
Chromosome 18: NEGATIVE
Y Chromosome: DETECTED

## 2018-01-20 ENCOUNTER — Telehealth: Payer: Self-pay | Admitting: Maternal Newborn

## 2018-01-20 NOTE — Telephone Encounter (Signed)
Patient is calling for labs results. Please advise. 

## 2018-01-26 ENCOUNTER — Encounter
Admission: RE | Admit: 2018-01-26 | Discharge: 2018-01-26 | Disposition: A | Discharge status: Home / Self Care | Payer: Disability Insurance | Source: Ambulatory Visit | Attending: Anesthesiology | Admitting: Anesthesiology

## 2018-01-26 NOTE — Consult Note (Signed)
[facility] Anesthesia Consultation  ALESHKA CORNEY ZOX:[id number] DOB: [birth year] DOA: 01/26/2018 PCP: Leim Fabry, MD   Requesting physician: Dr. Jerene Pitch Date of consultation: 01/26/18 Reason for consultation: Obesity during pregnancy  CHIEF COMPLAINT:  Obesity during pregnancy  HISTORY OF PRESENT ILLNESS: Annalyn Blecher  is a 27 y.o. female with a known history of obesity, currently [redacted] weeks pregnant. This is her second pregnancy, had an epidural for labor analgesia with her previous pregnancy 6 yrs ago. Reports that she gained 90 lbs during her last pregnancy. Denies hx of cardiac disease. Denies personal or family hx of bleeding disorders.   PAST MEDICAL HISTORY:   Past Medical History:  Diagnosis Date  . Asthma     PAST SURGICAL HISTORY:  Past Surgical History:  Procedure Laterality Date  . TONSILLECTOMY      SOCIAL HISTORY:  Social History   Tobacco Use  . Smoking status: Former Games developer  . Smokeless tobacco: Never Used  Substance Use Topics  . Alcohol use: No    FAMILY HISTORY:  Family History  Problem Relation Age of Onset  . Breast cancer Paternal Aunt     DRUG ALLERGIES:  Allergies  Allergen Reactions  . Amoxicillin Shortness Of Breath    Has patient had a PCN reaction causing immediate rash, facial/tongue/throat swelling, SOB or lightheadedness with hypotension: Yes Has patient had a PCN reaction causing severe rash involving mucus membranes or skin necrosis: No Has patient had a PCN reaction that required hospitalization: No Has patient had a PCN reaction occurring within the last 10 years: No If all of the above answers are "NO", then may proceed with Cephalosporin use.  . Eggs Or Egg-Derived Products Other (See Comments)    Acid reflux  . Penicillins Swelling    Has patient had a PCN reaction causing immediate rash, facial/tongue/throat swelling, SOB or lightheadedness with hypotension: Yes Has patient had a  PCN reaction causing severe rash involving mucus membranes or skin necrosis: No Has patient had a PCN reaction that required hospitalization: No Has patient had a PCN reaction occurring within the last 10 years: No If all of the above answers are "NO", then may proceed with Cephalosporin use.   . Tomato Other (See Comments)    Acid reflux     REVIEW OF SYSTEMS:   RESPIRATORY: No cough, shortness of breath, wheezing.  CARDIOVASCULAR: No chest pain, orthopnea, edema.  HEMATOLOGY: No anemia, easy bruising or bleeding SKIN: No rash or lesion. NEUROLOGIC: No tingling, numbness, weakness.  PSYCHIATRY: No anxiety or depression.   MEDICATIONS AT HOME:  Prior to Admission medications   Medication Sig Start Date End Date Taking? Authorizing Provider  PRENATAL 28-0.8 MG TABS Take by mouth. 12/05/17 12/05/18  [provider]  SERTRALINE HCL PO Take 1 tablet by mouth daily.    [provider]      PHYSICAL EXAMINATION:   VITAL SIGNS: Blood pressure 137/62, temperature 36.7 C, temperature source Oral, height 5\' 4"  (1.626 m), weight (!) 143 kg, last menstrual period 10/19/2017, SpO2 100 %.  GENERAL:  27 y.o.-year-old patient no acute distress.  HEENT: Head atraumatic, normocephalic. Oropharynx and nasopharynx clear. MP 2, TM distance >3 cm, normal mouth opening. LUNGS: Normal breath sounds bilaterally, no wheezing, rales,rhonchi. No use of accessory muscles of respiration.  CARDIOVASCULAR: S1, S2 normal. No murmurs, rubs, or gallops.  EXTREMITIES: No pedal edema, cyanosis, or clubbing.  NEUROLOGIC: normal gait PSYCHIATRIC: The patient is alert and oriented x 3.  SKIN: No obvious  rash, lesion, or ulcer.    IMPRESSION AND PLAN:   Geronimo Runningboni Caetano  is a 27 y.o. female presenting with obesity during pregnancy. BMI is currently 54 at [redacted] weeks gestation.   Pt has significant adipose tissue on her back that could make either spinal or epidural anesthesia difficult. No indicators  of difficult intubation at this time but pt has hx of excessive weight gain during pregnancy so would need reevaluation later in pregnancy.   We discussed analgesic options during labor including epidural analgesia. Discussed that in obesity there can be increased difficulty with epidural placement or even failure of successful epidural. We also discussed that even after successful epidural placement there is increased risk of catheter migration out of the epidural space that would require catheter replacement. Discussed increased risk of difficult intubation during pregnancy should an emergency cesarean delivery be required.   We discussed repeat evaluation at 35-36 weeks by anesthesia to determine whether there is a high risk of complications of anesthesia for which we would recommend transfer of OB care to a facility with a higher maternal level of care designation. I recommended to her that she discuss the risks/benefits of early vs late transfer of care with her OB. Given her hx of excessive weight gain during pregnancy I believe there is a higher likelihood of need for transfer of care later in pregnancy.

## 2018-02-06 ENCOUNTER — Ambulatory Visit: Payer: Disability Insurance | Admitting: Dietician

## 2018-02-08 ENCOUNTER — Ambulatory Visit (INDEPENDENT_AMBULATORY_CARE_PROVIDER_SITE_OTHER): Payer: Medicaid Other | Admitting: Advanced Practice Midwife

## 2018-02-08 ENCOUNTER — Other Ambulatory Visit (HOSPITAL_COMMUNITY)
Admission: RE | Admit: 2018-02-08 | Discharge: 2018-02-08 | Disposition: A | Discharge status: Home / Self Care | Payer: Medicaid Other | Source: Ambulatory Visit | Attending: Advanced Practice Midwife | Admitting: Advanced Practice Midwife

## 2018-02-08 ENCOUNTER — Encounter: Payer: Self-pay | Admitting: Advanced Practice Midwife

## 2018-02-08 VITALS — BP 110/60 | Ht 60.0 in | Wt 313.0 lb

## 2018-02-08 DIAGNOSIS — Z113 Encounter for screening for infections with a predominantly sexual mode of transmission: Secondary | ICD-10-CM

## 2018-02-08 DIAGNOSIS — Z3A16 16 weeks gestation of pregnancy: Secondary | ICD-10-CM | POA: Diagnosis present

## 2018-02-08 DIAGNOSIS — O99212 Obesity complicating pregnancy, second trimester: Secondary | ICD-10-CM

## 2018-02-08 LAB — POCT URINALYSIS DIPSTICK OB: Glucose, UA: NEGATIVE

## 2018-02-08 NOTE — Progress Notes (Signed)
Routine Prenatal Care Visit  Subjective  Alexis Gutierrez is a 27 y.o. G2P1001 at 5123w4d being seen today for ongoing prenatal care.  She is currently monitored for the following issues for this high-risk pregnancy and has Supervision of high risk pregnancy, antepartum; Obesity in pregnancy; Morbid obesity with BMI of 50.0-59.9, adult (HCC); Chlamydia infection affecting pregnancy in first trimester; Ovarian cyst affecting pregnancy in first trimester, antepartum; and History of learning disability on their problem list.  ----------------------------------------------------------------------------------- Patient reports no complaints. Reviewed anesthesia consult information with patient regarding need for 35 week consult with possibility of transfer. Patient has no question regarding plan of care at this time.  . Vag. Bleeding: None.  Movement: Present. Denies leaking of fluid.  ----------------------------------------------------------------------------------- The following portions of the patient's history were reviewed and updated as appropriate: allergies, current medications, past family history, past medical history, past social history, past surgical history and problem list. Problem list updated.   Objective  Blood pressure 110/60, weight (!) 313 lb (142 kg), last menstrual period 10/19/2017. Pregravid weight 327 lb (148.3 kg) Total Weight Gain -14 lb (-6.35 kg) Urinalysis: Urine Protein Trace  Urine Glucose Negative  Fetal Status: Fetal Heart Rate (bpm): 156   Movement: Present     General:  Alert, oriented and cooperative. Patient is in no acute distress.  Skin: Skin is warm and dry. No rash noted.   Cardiovascular: Normal heart rate noted  Respiratory: Normal respiratory effort, no problems with respiration noted  Abdomen: Soft, gravid, appropriate for gestational age. Pain/Pressure: Present     Pelvic:  Cervical exam deferred        Extremities: Normal range of motion.     Mental  Status: Normal mood and affect. Normal behavior. Normal judgment and thought content.   Assessment   27 y.o. G2P1001 at 4923w4d by  07/22/2018, by Ultrasound presenting for routine prenatal visit  Plan   Pregnancy #2 Problems (from 10/19/17 to present)    Problem Noted Resolved   Supervision of high risk pregnancy, antepartum 12/27/2017 by Farrel ConnersGutierrez, Colleen, CNM No   Overview Signed 12/27/2017  5:47 PM by Farrel ConnersGutierrez, Colleen, CNM    Clinic [facility] Prenatal Labs  Dating 8wk5d ultrasound Blood type: A/Positive/-- (10/10 1003)   Genetic Screen 1 Screen:    AFP:     Quad:     NIPS: Antibody:Negative (10/10 1003)  Anatomic US  Rubella: 3.70 (10/10 1003) Varicella: Immune  GTT Early:               Third trimester:  RPR: Non Reactive (10/10 1003)   Rhogam  HBsAg: Negative (10/10 1003)   TDaP vaccine                       Flu Shot: HIV: Non Reactive (10/10 1003)   Baby Food                                GBS:   Contraception  Pap:  CBB   Chlamydia positive 12/15/17  CS/VBAC    Support Person               Preterm labor symptoms and general obstetric precautions including but not limited to vaginal bleeding, contractions, leaking of fluid and fetal movement were reviewed in detail with the patient.   Return in about 4 weeks (around 03/08/2018) for anatomy scan and rob.  Tresea MallJane Analiza Cowger, CNM 02/08/2018 9:33 AM

## 2018-02-09 LAB — CERVICOVAGINAL ANCILLARY ONLY
Chlamydia: NEGATIVE
Neisseria Gonorrhea: NEGATIVE

## 2018-02-11 ENCOUNTER — Other Ambulatory Visit: Payer: Self-pay

## 2018-02-11 ENCOUNTER — Emergency Department
Admission: EM | Admit: 2018-02-11 | Discharge: 2018-02-11 | Disposition: A | Discharge status: Home / Self Care | Payer: Medicaid Other | Attending: Emergency Medicine | Admitting: Emergency Medicine

## 2018-02-11 ENCOUNTER — Emergency Department: Payer: Medicaid Other

## 2018-02-11 ENCOUNTER — Encounter: Payer: Self-pay | Admitting: Emergency Medicine

## 2018-02-11 DIAGNOSIS — J45909 Unspecified asthma, uncomplicated: Secondary | ICD-10-CM | POA: Diagnosis not present

## 2018-02-11 DIAGNOSIS — O2342 Unspecified infection of urinary tract in pregnancy, second trimester: Secondary | ICD-10-CM | POA: Insufficient documentation

## 2018-02-11 DIAGNOSIS — Z3A00 Weeks of gestation of pregnancy not specified: Secondary | ICD-10-CM | POA: Diagnosis not present

## 2018-02-11 DIAGNOSIS — Z87891 Personal history of nicotine dependence: Secondary | ICD-10-CM | POA: Diagnosis not present

## 2018-02-11 DIAGNOSIS — R350 Frequency of micturition: Secondary | ICD-10-CM | POA: Diagnosis present

## 2018-02-11 LAB — CBC WITH DIFFERENTIAL/PLATELET
Abs Immature Granulocytes: 0.06 10*3/uL (ref 0.00–0.07)
BASOS ABS: 0 10*3/uL (ref 0.0–0.1)
Basophils Relative: 0 %
Eosinophils Absolute: 0.1 10*3/uL (ref 0.0–0.5)
Eosinophils Relative: 1 %
HCT: 37.9 % (ref 36.0–46.0)
Hemoglobin: 12.2 g/dL (ref 12.0–15.0)
Immature Granulocytes: 1 %
Lymphocytes Relative: 25 %
Lymphs Abs: 2.7 10*3/uL (ref 0.7–4.0)
MCH: 26 pg (ref 26.0–34.0)
MCHC: 32.2 g/dL (ref 30.0–36.0)
MCV: 80.8 fL (ref 80.0–100.0)
Monocytes Absolute: 0.6 10*3/uL (ref 0.1–1.0)
Monocytes Relative: 5 %
Neutro Abs: 7.5 10*3/uL (ref 1.7–7.7)
Neutrophils Relative %: 68 %
PLATELETS: 225 10*3/uL (ref 150–400)
RBC: 4.69 MIL/uL (ref 3.87–5.11)
RDW: 14.3 % (ref 11.5–15.5)
WBC: 11 10*3/uL — ABNORMAL HIGH (ref 4.0–10.5)
nRBC: 0 % (ref 0.0–0.2)

## 2018-02-11 LAB — URINALYSIS, COMPLETE (UACMP) WITH MICROSCOPIC
BILIRUBIN URINE: NEGATIVE
Glucose, UA: NEGATIVE mg/dL
Ketones, ur: 80 mg/dL — AB
Nitrite: NEGATIVE
Protein, ur: 100 mg/dL — AB
RBC / HPF: >50 RBC/hpf — ABNORMAL HIGH (ref 0–5)
Specific Gravity, Urine: 1.025 (ref 1.005–1.030)
pH: 5 (ref 5.0–8.0)

## 2018-02-11 LAB — COMPREHENSIVE METABOLIC PANEL
ALT: 23 U/L (ref 0–44)
AST: 14 U/L — ABNORMAL LOW (ref 15–41)
Albumin: 3.3 g/dL — ABNORMAL LOW (ref 3.5–5.0)
Alkaline Phosphatase: 38 U/L (ref 38–126)
Anion gap: 8 (ref 5–15)
BUN: <5 mg/dL — ABNORMAL LOW (ref 6–20)
CO2: 22 mmol/L (ref 22–32)
Calcium: 8.9 mg/dL (ref 8.9–10.3)
Chloride: 107 mmol/L (ref 98–111)
Creatinine, Ser: 0.55 mg/dL (ref 0.44–1.00)
GFR calc Af Amer: >60 mL/min (ref >60)
GFR calc non Af Amer: >60 mL/min (ref >60)
GLUCOSE: 89 mg/dL (ref 70–99)
Potassium: 3.6 mmol/L (ref 3.5–5.1)
SODIUM: 137 mmol/L (ref 135–145)
Total Bilirubin: 0.5 mg/dL (ref 0.3–1.2)
Total Protein: 6.7 g/dL (ref 6.5–8.1)

## 2018-02-11 LAB — ABO/RH: ABO/RH(D): A POS

## 2018-02-11 LAB — HCG, QUANTITATIVE, PREGNANCY: hCG, Beta Chain, Quant, S: 26847 m[IU]/mL — ABNORMAL HIGH (ref <5)

## 2018-02-11 MED ORDER — NITROFURANTOIN MONOHYD MACRO 100 MG PO CAPS
100.0000 mg | ORAL_CAPSULE | Freq: Once | ORAL | Status: AC
  Administered 2018-02-11: 100 mg via ORAL
  Filled 2018-02-11: qty 1

## 2018-02-11 MED ORDER — NITROFURANTOIN MONOHYD MACRO 100 MG PO CAPS: 100.0000 mg | ORAL_CAPSULE | Freq: Two times a day (BID) | ORAL | 0 refills | Status: DC

## 2018-02-11 NOTE — ED Notes (Signed)
Pt to US via wheelchair.

## 2018-02-11 NOTE — ED Notes (Signed)
In with Dr Mayford KnifeWilliams to assess pt; she c/o dysuria since this am; has noticed blood on tissue when she wipes after voiding; denies pain at any other time; reports pain 8/10 when voids; pt is pregnant, due in May with 2nd child; pt denies vaginal discharge or abd pain; denies fever; abd soft; BS x 4;

## 2018-02-11 NOTE — ED Provider Notes (Signed)
[facility] Emergency Department Provider Note       Time seen: ----------------------------------------- 8:19 PM on 02/11/2018 -----------------------------------------   I have reviewed the triage vital signs and the nursing notes.  HISTORY   Chief Complaint Urinary Frequency and Vaginal Bleeding    HPI Alexis Gutierrez is a 27 y.o. female with a history of asthma who presents to the ED for dysuria since this morning.  Patient has noticed blood on the tissue when she wipes after voiding.  She denies any vaginal bleeding or discharge.  She reports pain at 8 out of 10 when she urinates.  She currently is around 4 months pregnant.  Past Medical History:  Diagnosis Date  . Asthma     Patient Active Problem List   Diagnosis Date Noted  . Supervision of high risk pregnancy, antepartum 12/27/2017  . Obesity in pregnancy 12/27/2017  . Morbid obesity with BMI of 50.0-59.9, adult (HCC) 12/27/2017  . Chlamydia infection affecting pregnancy in first trimester 12/27/2017  . Ovarian cyst affecting pregnancy in first trimester, antepartum 12/27/2017  . History of learning disability 07/09/2015    Past Surgical History:  Procedure Laterality Date  . TONSILLECTOMY      Allergies Amoxicillin; Eggs or egg-derived products; Penicillins; and Tomato  Social History Social History   Tobacco Use  . Smoking status: Former Games developer  . Smokeless tobacco: Never Used  Substance Use Topics  . Alcohol use: No  . Drug use: Not Currently   Review of Systems Constitutional: Negative for fever. Cardiovascular: Negative for chest pain. Respiratory: Negative for shortness of breath. Gastrointestinal: Negative for abdominal pain, vomiting and diarrhea. Genitourinary: Positive for dysuria, hematuria Musculoskeletal: Negative for back pain. Skin: Negative for rash. Neurological: Negative for headaches, focal weakness or numbness.  All systems negative/normal/unremarkable  except as stated in the HPI  ____________________________________________   PHYSICAL EXAM:  VITAL SIGNS: ED Triage Vitals [02/11/18 1828]  Enc Vitals Group     BP 140/85     Pulse Rate 93     Resp 18     Temp 98.2 F (36.8 C)     Temp Source Oral     SpO2 100 %     Weight (!) 313 lb (142 kg)     Height 5' (1.524 m)     Head Circumference      Peak Flow      Pain Score 0     Pain Loc      Pain Edu?      Excl. in GC?    Constitutional: Alert and oriented.  Obese, no distress Eyes: Conjunctivae are normal. Normal extraocular movements. Cardiovascular: Normal rate, regular rhythm. No murmurs, rubs, or gallops. Respiratory: Normal respiratory effort without tachypnea nor retractions. Breath sounds are clear and equal bilaterally. No wheezes/rales/rhonchi. Gastrointestinal: Soft and nontender. Normal bowel sounds Musculoskeletal: Nontender with normal range of motion in extremities. No lower extremity tenderness nor edema. Neurologic:  Normal speech and language. No gross focal neurologic deficits are appreciated.  Skin:  Skin is warm, dry and intact. No rash noted. Psychiatric: Mood and affect are normal. Speech and behavior are normal.   ____________________________________________  ED COURSE:  As part of my medical decision making, I reviewed the following data within the electronic MEDICAL RECORD NUMBER History obtained from family if available, nursing notes, old chart and ekg, as well as notes from prior ED visits. Patient presented for dysuria and hematuria, we will assess with labs and imaging as indicated at this  time.   Procedures ____________________________________________   LABS (pertinent positives/negatives)  Labs Reviewed  URINALYSIS, COMPLETE (UACMP) WITH MICROSCOPIC - Abnormal; Notable for the following components:      Result Value   Color, Urine AMBER (*)    APPearance TURBID (*)    Hgb urine dipstick LARGE (*)    Ketones, ur 80 (*)    Protein, ur 100  (*)    Leukocytes, UA MODERATE (*)    RBC / HPF >50 (*)    WBC, UA >50 (*)    Bacteria, UA MANY (*)    All other components within normal limits  HCG, QUANTITATIVE, PREGNANCY - Abnormal; Notable for the following components:   hCG, Beta Chain, Quant, S [id number] (*)    All other components within normal limits  CBC WITH DIFFERENTIAL/PLATELET - Abnormal; Notable for the following components:   WBC 11.0 (*)    All other components within normal limits  COMPREHENSIVE METABOLIC PANEL - Abnormal; Notable for the following components:   BUN <5 (*)    Albumin 3.3 (*)    AST 14 (*)    All other components within normal limits  URINE CULTURE  ABO/RH    RADIOLOGY Images were viewed by me  Pregnancy ultrasound IMPRESSION: Single living intrauterine gestation without complicating feature.  This exam is performed on an emergent basis and does not comprehensively evaluate fetal size, dating, or anatomy; follow-up complete OB US should be considered if further fetal assessment is warranted. ____________________________________________  DIFFERENTIAL DIAGNOSIS   UTI, threatened miscarriage, renal colic  FINAL ASSESSMENT AND PLAN  UTI, second trimester pregnancy   Plan: The patient had presented for dysuria and hematuria. [name] labs did indicate urinary tract infection. [name] imaging did not reveal any complicating features associated with her pregnancy.  She was started on Macrobid here.  She is not having any abdominal pain.  She is cleared for outpatient follow-up with her GYN doctor.   Ulice DashJohnathan E Tiney Zipper, MD   Note: This note was generated in part or whole with voice recognition software. Voice recognition is usually quite accurate but there are transcription errors that can and very often do occur. I apologize for any typographical errors that were not detected and corrected.     Emily FilbertWilliams, Khianna Blazina E, MD 02/11/18 2122

## 2018-02-11 NOTE — ED Notes (Signed)
Pt returned from US

## 2018-02-11 NOTE — ED Triage Notes (Signed)
Pt to ed with c/o ? Vaginal bleeding that started this am noted after she urinates.  Pt states she is approx 4 months pregnant. Also reports pain and urgency with urination.

## 2018-02-13 ENCOUNTER — Ambulatory Visit: Payer: Disability Insurance | Admitting: Dietician

## 2018-02-14 LAB — URINE CULTURE
Culture: >=100000 — AB
Special Requests: NORMAL

## 2018-02-24 ENCOUNTER — Encounter: Payer: Self-pay | Admitting: Dietician

## 2018-02-24 NOTE — Progress Notes (Signed)
Have not heard back from patient to reschedule her missed appointment from 02/13/18. Sent letter to referring provider.

## 2018-03-06 ENCOUNTER — Other Ambulatory Visit: Payer: Self-pay | Admitting: Obstetrics and Gynecology

## 2018-03-06 DIAGNOSIS — Z3482 Encounter for supervision of other normal pregnancy, second trimester: Secondary | ICD-10-CM

## 2018-03-08 NOTE — L&D Delivery Note (Signed)
Delivery Note At 11:45 AM a viable female child was delivered via Vaginal, Spontaneous (Presentation:ROA).  APGAR:8.,9; weight pending.   Placenta status: spontaneous, intact.  Cord: 3VC, nuchal cord x 1 without complications.  Cord pH: N/A  Anesthesia: Epidural Episiotomy: None Lacerations: None Suture Repair: none Est. Blood Loss (mL): 355 some oozing post delivery bimanual massage performed to aid in clearing clots from lower uterine segment given habitus.  of cytotec administered rectally.   Mom to postpartum.  Baby to Couplet care / Skin to Skin.  Vena Austria 07/25/2018, 12:01 PM

## 2018-03-09 ENCOUNTER — Other Ambulatory Visit: Payer: Medicaid Other

## 2018-03-09 ENCOUNTER — Encounter: Payer: Medicaid Other | Admitting: Advanced Practice Midwife

## 2018-03-10 ENCOUNTER — Ambulatory Visit (INDEPENDENT_AMBULATORY_CARE_PROVIDER_SITE_OTHER): Payer: Medicaid Other | Admitting: Obstetrics and Gynecology

## 2018-03-10 ENCOUNTER — Ambulatory Visit (INDEPENDENT_AMBULATORY_CARE_PROVIDER_SITE_OTHER): Payer: Medicaid Other

## 2018-03-10 ENCOUNTER — Encounter: Payer: Self-pay | Admitting: Obstetrics and Gynecology

## 2018-03-10 VITALS — BP 110/68 | Wt 306.0 lb

## 2018-03-10 DIAGNOSIS — O219 Vomiting of pregnancy, unspecified: Secondary | ICD-10-CM

## 2018-03-10 DIAGNOSIS — Z3A20 20 weeks gestation of pregnancy: Secondary | ICD-10-CM

## 2018-03-10 DIAGNOSIS — Z3482 Encounter for supervision of other normal pregnancy, second trimester: Secondary | ICD-10-CM

## 2018-03-10 DIAGNOSIS — Z363 Encounter for antenatal screening for malformations: Secondary | ICD-10-CM

## 2018-03-10 DIAGNOSIS — O0990 Supervision of high risk pregnancy, unspecified, unspecified trimester: Secondary | ICD-10-CM

## 2018-03-10 DIAGNOSIS — Z6841 Body Mass Index (BMI) 40.0 and over, adult: Secondary | ICD-10-CM

## 2018-03-10 MED ORDER — PROMETHAZINE HCL 25 MG PO TABS: 25.0000 mg | ORAL_TABLET | Freq: Four times a day (QID) | ORAL | 2 refills | Status: DC | PRN

## 2018-03-10 NOTE — Progress Notes (Signed)
Routine Prenatal Care Visit  Subjective  Alexis Gutierrez is a 28 y.o. G2P1001 at 8281w6d being seen today for ongoing prenatal care.  She is currently monitored for the following issues for this high-risk pregnancy and has Supervision of high risk pregnancy, antepartum; Obesity in pregnancy; Morbid obesity with BMI of 50.0-59.9, adult (HCC); Chlamydia infection affecting pregnancy in first trimester; Ovarian cyst affecting pregnancy in first trimester, antepartum; and History of learning disability on their problem list.  ----------------------------------------------------------------------------------- Patient reports nausea.    . Vag. Bleeding: None.  Movement: Present. Denies leaking of fluid.  ----------------------------------------------------------------------------------- The following portions of the patient's history were reviewed and updated as appropriate: allergies, current medications, past family history, past medical history, past social history, past surgical history and problem list. Problem list updated.   Objective  Blood pressure 110/68, weight (!) 306 lb (138.8 kg), last menstrual period 10/19/2017. Pregravid weight 327 lb (148.3 kg) Total Weight Gain -21 lb (-9.526 kg) Urinalysis:      Fetal Status: Fetal Heart Rate (bpm): 141   Movement: Present     General:  Alert, oriented and cooperative. Patient is in no acute distress.  Skin: Skin is warm and dry. No rash noted.   Cardiovascular: Normal heart rate noted  Respiratory: Normal respiratory effort, no problems with respiration noted  Abdomen: Soft, gravid, appropriate for gestational age. Pain/Pressure: Absent     Pelvic:  Cervical exam deferred        Extremities: Normal range of motion.     Mental Status: Normal mood and affect. Normal behavior. Normal judgment and thought content.     Assessment   28 y.o. G2P1001 at 4681w6d by  07/22/2018, by Ultrasound presenting for routine prenatal visit  Plan    Pregnancy #2 Problems (from 10/19/17 to present)    Problem Noted Resolved   Supervision of high risk pregnancy, antepartum 12/27/2017 by Farrel ConnersGutierrez, Colleen, CNM No   Overview Addendum 03/10/2018 11:37 AM by Natale MilchSchuman, Christanna R, MD    Clinic [facility] Prenatal Labs  Dating 8wk5d ultrasound Blood type: A/Positive/-- (10/10 1003)   Genetic Screen  NIPS: Normal XY Antibody:Negative (10/10 1003)  Anatomic US incomplete Rubella: 3.70 (10/10 1003) Varicella: Immune  GTT Early:  591             Third trimester:  RPR: Non Reactive (10/10 1003)   Rhogam Not needed HBsAg: Negative (10/10 1003)   TDaP vaccine                        Flu Shot:12/15/17 HIV: Non Reactive (10/10 1003)   Baby Food                                GBS:   Contraception  Pap: NIL 2019  CBB   Chlamydia positive 12/15/17  CS/VBAC    Support Person               Gestational age appropriate obstetric precautions including but not limited to vaginal bleeding, contractions, leaking of fluid and fetal movement were reviewed in detail with the patient.    Anatomy scan incomplete, follow up in 2 weeks Anesthesia consult at 35 weeks  Has been intentionally loosing weight.  Reports some nausea no vomiting. She eats soup and crackers and then usually feels better. Rx sent for phenergan Would like prenatal refilled, will call with the name of the vitamin she liked from  the health department.  She missed her appointment with the nutritionist but says she will call to reschedule.   Return in about 2 weeks (around 03/24/2018) for ROB and [place] with MD.  Natale Milch MD [facility], [facility] 03/10/2018, 11:47 AM

## 2018-03-10 NOTE — Progress Notes (Signed)
ROB/GTT Would like more Prenatals No concerns

## 2018-03-13 ENCOUNTER — Other Ambulatory Visit: Payer: Self-pay | Admitting: Obstetrics and Gynecology

## 2018-03-13 DIAGNOSIS — O0990 Supervision of high risk pregnancy, unspecified, unspecified trimester: Secondary | ICD-10-CM

## 2018-03-13 MED ORDER — PRENATAL VITAMINS 0.8 MG PO TABS: 1.0000 | ORAL_TABLET | Freq: Every day | ORAL | 12 refills | Status: DC

## 2018-03-24 ENCOUNTER — Ambulatory Visit (INDEPENDENT_AMBULATORY_CARE_PROVIDER_SITE_OTHER): Payer: Medicaid Other

## 2018-03-24 ENCOUNTER — Ambulatory Visit (INDEPENDENT_AMBULATORY_CARE_PROVIDER_SITE_OTHER): Payer: Medicaid Other | Admitting: Obstetrics and Gynecology

## 2018-03-24 VITALS — BP 104/62 | Wt 304.0 lb

## 2018-03-24 DIAGNOSIS — O0990 Supervision of high risk pregnancy, unspecified, unspecified trimester: Secondary | ICD-10-CM

## 2018-03-24 DIAGNOSIS — Z6841 Body Mass Index (BMI) 40.0 and over, adult: Secondary | ICD-10-CM

## 2018-03-24 DIAGNOSIS — Z3A23 23 weeks gestation of pregnancy: Secondary | ICD-10-CM

## 2018-03-24 DIAGNOSIS — Z87898 Personal history of other specified conditions: Secondary | ICD-10-CM

## 2018-03-24 DIAGNOSIS — O99210 Obesity complicating pregnancy, unspecified trimester: Secondary | ICD-10-CM

## 2018-03-24 DIAGNOSIS — O99212 Obesity complicating pregnancy, second trimester: Secondary | ICD-10-CM

## 2018-03-24 NOTE — Progress Notes (Signed)
Routine Prenatal Care Visit  Subjective  Alexis Gutierrez is a 28 y.o. G2P1001 at 9342w6d being seen today for ongoing prenatal care.  She is currently monitored for the following issues for this high-risk pregnancy and has Supervision of high risk pregnancy, antepartum; Obesity in pregnancy; Morbid obesity with BMI of 50.0-59.9, adult (HCC); Chlamydia infection affecting pregnancy in first trimester; Ovarian cyst affecting pregnancy in first trimester, antepartum; and History of learning disability on their problem list.  ----------------------------------------------------------------------------------- Patient reports no complaints.   Contractions: Not present. Vag. Bleeding: None.  Movement: Present. Denies leaking of fluid.  ----------------------------------------------------------------------------------- The following portions of the patient's history were reviewed and updated as appropriate: allergies, current medications, past family history, past medical history, past social history, past surgical history and problem list. Problem list updated.   Objective  Blood pressure 104/62, weight (!) 304 lb (137.9 kg), last menstrual period 10/19/2017. Body mass index is 59.37 kg/m.  Pregravid weight 327 lb (148.3 kg) Total Weight Gain -23 lb (-10.4 kg) Urinalysis:      Fetal Status: Fetal Heart Rate (bpm): 147   Movement: Present     General:  Alert, oriented and cooperative. Patient is in no acute distress.  Skin: Skin is warm and dry. No rash noted.   Cardiovascular: Normal heart rate noted  Respiratory: Normal respiratory effort, no problems with respiration noted  Abdomen: Soft, gravid, appropriate for gestational age. Pain/Pressure: Absent     Pelvic:  Cervical exam deferred        Extremities: Normal range of motion.     ental Status: Normal mood and affect. Normal behavior. Normal judgment and thought content.   [place] Ob Comp + 14 Wk  Result Date: 03/10/2018 Patient Name: Alexis Gutierrez  J Gutierrez DOB: 08/30/1990 MRN: [id number] ULTRASOUND REPORT Location: [facility] Date of Service: 03/10/2018 Indications:Anatomy Ultrasound Findings: Mason JimSingleton intrauterine pregnancy is visualized with FHR at 141 BPM. Biometrics give an (U/S) Gestational age of 5090w5d and an (U/S) EDD of 07/23/18; this correlates with the clinically established Estimated Date of Delivery: 07/22/18 Fetal presentation is Breech. EFW: 365g (13oz). Placenta: posterior. Grade: 0. Cervix: 3.3cm. AFI: subjectively normal. Anatomic survey is incomplete for 4 chamber heart/outflow tracts, profile and nose/lips; Gender - female.  Right Ovary is normal in appearance. Left Ovary is normal appearance. Survey of the adnexa demonstrates no adnexal masses. There is no free peritoneal fluid in the cul de sac. Impression: 1. 3155w6d Viable Singleton Intrauterine pregnancy by U/S. 2. (U/S) EDD is consistent with Clinically established Estimated Date of Delivery: 07/22/18 . 3. Follow up cardiac views, profile and nose/lips. Recommendations: 1.Clinical correlation with the patient's History and Physical Exam. Darlina GuysAbby M Clarke, RDMS RVT I have reviewed this ultrasound and the report. I agree with the above assessment and plan. Adelene Idlerhristanna Schuman MD [facility] 03/10/18 11:30 AM    [place] Ob Follow Up  Result Date: 03/24/2018 Patient Name: Alexis Gutierrez DOB: 05/29/1990 MRN: [id number] ULTRASOUND REPORT Location: [facility] Date of Service: 03/24/2018 Indications: Anatomy follow up ultrasound Findings: Mason JimSingleton intrauterine pregnancy is visualized with FHR at 147 BPM. Fetal presentation is Breech. Placenta: posterior. Grade: 1 AFI: subjectively normal. Anatomic survey is complete 4 chamber heart/outflow tracts, profile and nose/lips. There is no free peritoneal fluid in the cul de sac. Impression: 1. 5042w6d Viable Singleton Intrauterine pregnancy previously established criteria. 2. Normal Anatomy Scan is now complete Recommendations:  1.Clinical correlation with the patient's History and Physical Exam. Darlina GuysAbby M Clarke, RDMS RVT  There is a singleton gestation with subjectively normal amniotic fluid volume.  Limited evaluation of the fetal anatomy was performed today, focusing on on anatomic structures not fully visualized at the time of prior study.The visualized fetal anatomical survey appears within normal limits within the resolution of ultrasound as described above, and the anatomic survey is now complete.  It must be noted that a normal ultrasound is unable to rule out fetal aneuploidy.  Vena AustriaAndreas Marck Mcclenny, MD, Evern CoreFACOG [facility], [facility] 03/24/2018, 1:01 PM     Assessment   28 y.o. G2P1001 at 3562w6d by  07/22/2018, by Ultrasound presenting for routine prenatal visit  Plan   Pregnancy #2 Problems (from 10/19/17 to present)    Problem Noted Resolved   Supervision of high risk pregnancy, antepartum 12/27/2017 by Farrel ConnersGutierrez, Colleen, CNM No   Overview Addendum 03/10/2018 11:37 AM by Natale MilchSchuman, Christanna R, MD    Clinic [facility] Prenatal Labs  Dating 8wk5d ultrasound Blood type: A/Positive/-- (10/10 1003)   Genetic Screen  NIPS: Normal XY Antibody:Negative (10/10 1003)  Anatomic US Complete at 23 weeks Rubella: 3.70 (10/10 1003) Varicella: Immune  GTT Early:  3091             Third trimester:  RPR: Non Reactive (10/10 1003)   Rhogam Not needed HBsAg: Negative (10/10 1003)   TDaP vaccine                        Flu Shot:12/15/17 HIV: Non Reactive (10/10 1003)   Baby Food                                GBS:   Contraception  Pap: NIL 2019  CBB   Chlamydia positive 12/15/17  CS/VBAC N/A   Support Person               Gestational age appropriate obstetric precautions including but not limited to vaginal bleeding, contractions, leaking of fluid and fetal movement were reviewed in detail with the patient.    Return in about 4 weeks (around 04/21/2018) for ROB.  Vena AustriaAndreas Kathy Wahid, MD, Evern CoreFACOG [facility],  [facility] 03/26/2018, 1:16 PM

## 2018-03-24 NOTE — Progress Notes (Signed)
ROB Ultrasound today 

## 2018-04-21 ENCOUNTER — Ambulatory Visit (INDEPENDENT_AMBULATORY_CARE_PROVIDER_SITE_OTHER): Payer: Medicaid Other | Admitting: Obstetrics & Gynecology

## 2018-04-21 ENCOUNTER — Encounter: Payer: Medicaid Other | Admitting: Maternal Newborn

## 2018-04-21 VITALS — BP 120/80 | Wt 306.0 lb

## 2018-04-21 DIAGNOSIS — Z3A26 26 weeks gestation of pregnancy: Secondary | ICD-10-CM

## 2018-04-21 DIAGNOSIS — O99212 Obesity complicating pregnancy, second trimester: Secondary | ICD-10-CM

## 2018-04-21 DIAGNOSIS — O0990 Supervision of high risk pregnancy, unspecified, unspecified trimester: Secondary | ICD-10-CM

## 2018-04-21 DIAGNOSIS — O99210 Obesity complicating pregnancy, unspecified trimester: Secondary | ICD-10-CM

## 2018-04-21 LAB — POCT URINALYSIS DIPSTICK OB: Glucose, UA: NEGATIVE

## 2018-04-21 NOTE — Patient Instructions (Signed)

## 2018-04-21 NOTE — Progress Notes (Signed)
  Subjective  Fetal Movement? yes Contractions? no Leaking Fluid? no Vaginal Bleeding? no  Objective  BP 120/80   Wt (!) 306 lb (138.8 kg)   LMP 10/19/2017 (Within Days)   BMI 59.76 kg/m  General: NAD Pumonary: no increased work of breathing Abdomen: gravid, non-tender Extremities: no edema Psychiatric: mood appropriate, affect full  Assessment  28 y.o. G2P1001 at [redacted]w[redacted]d by  07/22/2018, by Ultrasound presenting for routine prenatal visit  Plan   Problem List Items Addressed This Visit    Supervision of high risk pregnancy, antepartum BMI >=40 [x ] early 1h gtt -  [x ] u/s for dating [x]   [x ] nutritional goals [x ] folic acid 1mg  [x ] bASA (>12 weeks) [x ] consider nutrition consult [x ] consider maternal EKG 1st trimester [x ] Growth u/s 70 [x ], 34 [ ]  [ ]  NST/AFI weekly 36+ weeks (36[] , 37[] , 38[] , 39[] , 40[] ) [ ]  IOL by 41 weeks (scheduled, prn [] )    Obesity in pregnancy   Relevant Orders   US OB Follow Up for growth one month Anes recheck 34 weeks Diet and weight management discussed Risk of obesity and L&D discussed    [redacted] weeks gestation of pregnancy    Relevant Orders   POC Urinalysis Dipstick OB (Completed)   28 Week RH+Panel  Soon [facility] discussed   Clinic [facility] Prenatal Labs  Dating 8wk5d ultrasound Blood type: A/Positive/-- (10/10 1003)   Genetic Screen  NIPS: Normal XY Antibody:Negative (10/10 1003)  Anatomic [place] Complete at 23 weeks follow up Rubella: 3.70 (10/10 1003) Varicella: Immune  GTT Early:  91             Third trimester:  RPR: Non Reactive (10/10 1003)   Rhogam Not needed HBsAg: Negative (10/10 1003)   TDaP vaccine                        Flu Shot:12/15/17 HIV: Non Reactive (10/10 1003)   Baby Food                                GBS:   Contraception  Pap: NIL 2019  Anes consult 01/2018- approved; recheck 06/2018 Chlamydia positive 12/15/17 negative 01/21/18 TOC   Annamarie Major, MD, [facility], [facility] 04/21/2018  10:59 AM

## 2018-04-27 ENCOUNTER — Other Ambulatory Visit: Payer: Medicaid Other

## 2018-04-27 DIAGNOSIS — Z3A26 26 weeks gestation of pregnancy: Secondary | ICD-10-CM

## 2018-04-28 LAB — 28 WEEK RH+PANEL
BASOS ABS: 0 10*3/uL (ref 0.0–0.2)
Basos: 0 %
EOS (ABSOLUTE): 0.1 10*3/uL (ref 0.0–0.4)
Eos: 1 %
Gestational Diabetes Screen: 127 mg/dL (ref 65–139)
HIV Screen 4th Generation wRfx: NONREACTIVE
Hematocrit: 34.5 % (ref 34.0–46.6)
Hemoglobin: 11.3 g/dL (ref 11.1–15.9)
IMMATURE GRANULOCYTES: 0 %
Immature Grans (Abs): 0 10*3/uL (ref 0.0–0.1)
Lymphocytes Absolute: 1.6 10*3/uL (ref 0.7–3.1)
Lymphs: 22 %
MCH: 26.4 pg — ABNORMAL LOW (ref 26.6–33.0)
MCHC: 32.8 g/dL (ref 31.5–35.7)
MCV: 81 fL (ref 79–97)
Monocytes Absolute: 0.4 10*3/uL (ref 0.1–0.9)
Monocytes: 6 %
Neutrophils Absolute: 5.1 10*3/uL (ref 1.4–7.0)
Neutrophils: 71 %
Platelets: 224 10*3/uL (ref 150–450)
RBC: 4.28 x10E6/uL (ref 3.77–5.28)
RDW: 14.3 % (ref 11.7–15.4)
RPR Ser Ql: NONREACTIVE
WBC: 7.2 10*3/uL (ref 3.4–10.8)

## 2018-05-19 ENCOUNTER — Ambulatory Visit (INDEPENDENT_AMBULATORY_CARE_PROVIDER_SITE_OTHER): Payer: Medicaid Other | Admitting: Maternal Newborn

## 2018-05-19 ENCOUNTER — Encounter: Payer: Self-pay | Admitting: Maternal Newborn

## 2018-05-19 ENCOUNTER — Other Ambulatory Visit: Payer: Self-pay

## 2018-05-19 ENCOUNTER — Ambulatory Visit: Payer: Medicaid Other

## 2018-05-19 VITALS — BP 128/62 | Wt 300.0 lb

## 2018-05-19 DIAGNOSIS — Z23 Encounter for immunization: Secondary | ICD-10-CM

## 2018-05-19 DIAGNOSIS — O0990 Supervision of high risk pregnancy, unspecified, unspecified trimester: Secondary | ICD-10-CM

## 2018-05-19 DIAGNOSIS — Z369 Encounter for antenatal screening, unspecified: Secondary | ICD-10-CM

## 2018-05-19 DIAGNOSIS — O99213 Obesity complicating pregnancy, third trimester: Secondary | ICD-10-CM

## 2018-05-19 DIAGNOSIS — Z3A30 30 weeks gestation of pregnancy: Secondary | ICD-10-CM

## 2018-05-19 LAB — POCT URINALYSIS DIPSTICK OB: Glucose, UA: NEGATIVE

## 2018-05-19 NOTE — Patient Instructions (Signed)
Third Trimester of Pregnancy The third trimester is from week 28 through week 40 (months 7 through 9). The third trimester is a time when the unborn baby (fetus) is growing rapidly. At the end of the ninth month, the fetus is about 20 inches in length and weighs 6-10 pounds. Body changes during your third trimester Your body will continue to go through many changes during pregnancy. The changes vary from woman to woman. During the third trimester:  Your weight will continue to increase. You can expect to gain 25-35 pounds (11-16 kg) by the end of the pregnancy.  You may begin to get stretch marks on your hips, abdomen, and breasts.  You may urinate more often because the fetus is moving lower into your pelvis and pressing on your bladder.  You may develop or continue to have heartburn. This is caused by increased hormones that slow down muscles in the digestive tract.  You may develop or continue to have constipation because increased hormones slow digestion and cause the muscles that push waste through your intestines to relax.  You may develop hemorrhoids. These are swollen veins (varicose veins) in the rectum that can itch or be painful.  You may develop swollen, bulging veins (varicose veins) in your legs.  You may have increased body aches in the pelvis, back, or thighs. This is due to weight gain and increased hormones that are relaxing your joints.  You may have changes in your hair. These can include thickening of your hair, rapid growth, and changes in texture. Some women also have hair loss during or after pregnancy, or hair that feels dry or thin. Your hair will most likely return to normal after your baby is born.  Your breasts will continue to grow and they will continue to become tender. A yellow fluid (colostrum) may leak from your breasts. This is the first milk you are producing for your baby.  Your belly button may stick out.  You may notice more swelling in your hands,  face, or ankles.  You may have increased tingling or numbness in your hands, arms, and legs. The skin on your belly may also feel numb.  You may feel short of breath because of your expanding uterus.  You may have more problems sleeping. This can be caused by the size of your belly, increased need to urinate, and an increase in your body's metabolism.  You may notice the fetus "dropping," or moving lower in your abdomen (lightening).  You may have increased vaginal discharge.  You may notice your joints feel loose and you may have pain around your pelvic bone. What to expect at prenatal visits You will have prenatal exams every 2 weeks until week 36. Then you will have weekly prenatal exams. During a routine prenatal visit:  You will be weighed to make sure you and the baby are growing normally.  Your blood pressure will be taken.  Your abdomen will be measured to track your baby's growth.  The fetal heartbeat will be listened to.  Any test results from the previous visit will be discussed.  You may have a cervical check near your due date to see if your cervix has softened or thinned (effaced).  You will be tested for Group B streptococcus. This happens between 35 and 37 weeks. Your health care provider may ask you:  What your birth plan is.  How you are feeling.  If you are feeling the baby move.  If you have had any abnormal   symptoms, such as leaking fluid, bleeding, severe headaches, or abdominal cramping.  If you are using any tobacco products, including cigarettes, chewing tobacco, and electronic cigarettes.  If you have any questions. Other tests or screenings that may be performed during your third trimester include:  Blood tests that check for low iron levels (anemia).  Fetal testing to check the health, activity level, and growth of the fetus. Testing is done if you have certain medical conditions or if there are problems during the pregnancy.  Nonstress test  (NST). This test checks the health of your baby to make sure there are no signs of problems, such as the baby not getting enough oxygen. During this test, a belt is placed around your belly. The baby is made to move, and its heart rate is monitored during movement. What is false labor? False labor is a condition in which you feel small, irregular tightenings of the muscles in the womb (contractions) that usually go away with rest, changing position, or drinking water. These are called Braxton Hicks contractions. Contractions may last for hours, days, or even weeks before true labor sets in. If contractions come at regular intervals, become more frequent, increase in intensity, or become painful, you should see your health care provider. What are the signs of labor?  Abdominal cramps.  Regular contractions that start at 10 minutes apart and become stronger and more frequent with time.  Contractions that start on the top of the uterus and spread down to the lower abdomen and back.  Increased pelvic pressure and dull back pain.  A watery or bloody mucus discharge that comes from the vagina.  Leaking of amniotic fluid. This is also known as your "water breaking." It could be a slow trickle or a gush. Let your health care provider know if it has a color or strange odor. If you have any of these signs, call your health care provider right away, even if it is before your due date. Follow these instructions at home: Medicines  Follow your health care provider's instructions regarding medicine use. Specific medicines may be either safe or unsafe to take during pregnancy.  Take a prenatal vitamin that contains at least 600 micrograms (mcg) of folic acid.  If you develop constipation, try taking a stool softener if your health care provider approves. Eating and drinking   Eat a balanced diet that includes fresh fruits and vegetables, whole grains, good sources of protein such as meat, eggs, or tofu,  and low-fat dairy. Your health care provider will help you determine the amount of weight gain that is right for you.  Avoid raw meat and uncooked cheese. These carry germs that can cause birth defects in the baby.  If you have low calcium intake from food, talk to your health care provider about whether you should take a daily calcium supplement.  Eat four or five small meals rather than three large meals a day.  Limit foods that are high in fat and processed sugars, such as fried and sweet foods.  To prevent constipation: ? Drink enough fluid to keep your urine clear or pale yellow. ? Eat foods that are high in fiber, such as fresh fruits and vegetables, whole grains, and beans. Activity  Exercise only as directed by your health care provider. Most women can continue their usual exercise routine during pregnancy. Try to exercise for 30 minutes at least 5 days a week. Stop exercising if you experience uterine contractions.  Avoid heavy lifting.  Do   not exercise in extreme heat or humidity, or at high altitudes.  Wear low-heel, comfortable shoes.  Practice good posture.  You may continue to have sex unless your health care provider tells you otherwise. Relieving pain and discomfort  Take frequent breaks and rest with your legs elevated if you have leg cramps or low back pain.  Take warm sitz baths to soothe any pain or discomfort caused by hemorrhoids. Use hemorrhoid cream if your health care provider approves.  Wear a good support bra to prevent discomfort from breast tenderness.  If you develop varicose veins: ? Wear support pantyhose or compression stockings as told by your healthcare provider. ? Elevate your feet for 15 minutes, 3-4 times a day. Prenatal care  Write down your questions. Take them to your prenatal visits.  Keep all your prenatal visits as told by your health care provider. This is important. Safety  Wear your seat belt at all times when driving.  Make  a list of emergency phone numbers, including numbers for family, friends, the hospital, and police and fire departments. General instructions  Avoid cat litter boxes and soil used by cats. These carry germs that can cause birth defects in the baby. If you have a cat, ask someone to clean the litter box for you.  Do not travel far distances unless it is absolutely necessary and only with the approval of your health care provider.  Do not use hot tubs, steam rooms, or saunas.  Do not drink alcohol.  Do not use any products that contain nicotine or tobacco, such as cigarettes and e-cigarettes. If you need help quitting, ask your health care provider.  Do not use any medicinal herbs or unprescribed drugs. These chemicals affect the formation and growth of the baby.  Do not douche or use tampons or scented sanitary pads.  Do not cross your legs for long periods of time.  To prepare for the arrival of your baby: ? Take prenatal classes to understand, practice, and ask questions about labor and delivery. ? Make a trial run to the hospital. ? Visit the hospital and tour the maternity area. ? Arrange for maternity or paternity leave through employers. ? Arrange for family and friends to take care of pets while you are in the hospital. ? Purchase a rear-facing car seat and make sure you know how to install it in your car. ? Pack your hospital bag. ? Prepare the baby's nursery. Make sure to remove all pillows and stuffed animals from the baby's crib to prevent suffocation.  Visit your dentist if you have not gone during your pregnancy. Use a soft toothbrush to brush your teeth and be gentle when you floss. Contact a health care provider if:  You are unsure if you are in labor or if your water has broken.  You become dizzy.  You have mild pelvic cramps, pelvic pressure, or nagging pain in your abdominal area.  You have lower back pain.  You have persistent nausea, vomiting, or  diarrhea.  You have an unusual or bad smelling vaginal discharge.  You have pain when you urinate. Get help right away if:  Your water breaks before 37 weeks.  You have regular contractions less than 5 minutes apart before 37 weeks.  You have a fever.  You are leaking fluid from your vagina.  You have spotting or bleeding from your vagina.  You have severe abdominal pain or cramping.  You have rapid weight loss or weight gain.  You have   shortness of breath with chest pain.  You notice sudden or extreme swelling of your face, hands, ankles, feet, or legs.  Your baby makes fewer than 10 movements in 2 hours.  You have severe headaches that do not go away when you take medicine.  You have vision changes. Summary  The third trimester is from week 28 through week 40, months 7 through 9. The third trimester is a time when the unborn baby (fetus) is growing rapidly.  During the third trimester, your discomfort may increase as you and your baby continue to gain weight. You may have abdominal, leg, and back pain, sleeping problems, and an increased need to urinate.  During the third trimester your breasts will keep growing and they will continue to become tender. A yellow fluid (colostrum) may leak from your breasts. This is the first milk you are producing for your baby.  False labor is a condition in which you feel small, irregular tightenings of the muscles in the womb (contractions) that eventually go away. These are called Braxton Hicks contractions. Contractions may last for hours, days, or even weeks before true labor sets in.  Signs of labor can include: abdominal cramps; regular contractions that start at 10 minutes apart and become stronger and more frequent with time; watery or bloody mucus discharge that comes from the vagina; increased pelvic pressure and dull back pain; and leaking of amniotic fluid. This information is not intended to replace advice given to you by your  health care provider. Make sure you discuss any questions you have with your health care provider. Document Released: 02/16/2001 Document Revised: 03/30/2016 Document Reviewed: 03/30/2016 Elsevier Interactive Patient Education  2019 [employer]  

## 2018-05-19 NOTE — Progress Notes (Signed)
Routine Prenatal Care Visit  Subjective  Alexis Gutierrez is a 28 y.o. G2P1001 at [redacted]w[redacted]d being seen today for ongoing prenatal care.  She is currently monitored for the following issues for this high-risk pregnancy and has Supervision of high risk pregnancy, antepartum; Obesity in pregnancy; Morbid obesity with BMI of 50.0-59.9, adult (HCC); Chlamydia infection affecting pregnancy in first trimester; Ovarian cyst affecting pregnancy in first trimester, antepartum; and History of learning disability on their problem list.  ----------------------------------------------------------------------------------- Patient reports no complaints.   Contractions: Not present. Vag. Bleeding: None.  Movement: Present. No leaking of fluid.  ----------------------------------------------------------------------------------- The following portions of the patient's history were reviewed and updated as appropriate: allergies, current medications, past family history, past medical history, past social history, past surgical history and problem list. Problem list updated.  Objective  Blood pressure 128/62, weight 300 lb (136.1 kg), last menstrual period 10/19/2017. Pregravid weight 327 lb (148.3 kg) Total Weight Gain -27 lb (-12.2 kg) Body mass index is 58.59 kg/m.   Urinalysis: Urine dipstick shows negative for glucose, positive for protein (trace).  Fetal Status: Fetal Heart Rate (bpm): 145 Fundal Height: 36 cm Movement: Present     General:  Alert, oriented and cooperative. Patient is in no acute distress.  Skin: Skin is warm and dry. No rash noted.   Cardiovascular: Normal heart rate noted  Respiratory: Normal respiratory effort, no problems with respiration noted  Abdomen: Soft, gravid, appropriate for gestational age. Pain/Pressure: Absent     Pelvic:  Cervical exam deferred        Extremities: Normal range of motion.  Edema: None  Mental Status: Normal mood and affect. Normal behavior. Normal judgment  and thought content.    Assessment   28 y.o. G2P1001 at [redacted]w[redacted]d, EDD 07/22/2018 by Ultrasound presenting for a routine prenatal visit.  Plan   Pregnancy #2 Problems (from 10/19/17 to present)    Problem Noted Resolved   Supervision of high risk pregnancy, antepartum 12/27/2017 by Farrel Conners, CNM No   Overview Addendum 04/21/2018 10:58 AM by Nadara Mustard, MD    [facility]inic [facility] Prenatal Labs  Dating 8wk5d ultrasound Blood type: A/Positive/-- (10/10 1003)   Genetic Screen  NIPS: Normal XY Antibody:Negative (10/10 1003)  Anatomic [place] Complete at 23 weeks follow up Rubella: 3.70 (10/10 1003) Varicella: Immune  GTT Early:  91             Third trimester:  RPR: Non Reactive (10/10 1003)   Rhogam Not needed HBsAg: Negative (10/10 1003)   TDaP vaccine                        Flu Shot:12/15/17 HIV: Non Reactive (10/10 1003)   Baby Food                                GBS:   Contraception  Pap: NIL 2019  Anes consult 01/2018- approved; recheck 06/2018 Chlamydia positive 12/15/17 negative 01/21/18 TOC  CS/VBAC N.A   Support Person            TDaP received today.   Missed her growth scan this morning, reschedule to [facility] next week. Has transportation issues but said she does not need assistance from [place] to get to next appointment.  Please refer to After Visit Summary for other counseling recommendations.   Return in about 1 week (around 05/26/2018) for HROB/growth scan.  Marcelyn Bruins, CNM  05/19/2018   

## 2018-05-19 NOTE — Progress Notes (Signed)
ROB Not sleeping well TDAP today/Missed growth scan

## 2018-05-26 ENCOUNTER — Ambulatory Visit: Admission: RE | Admit: 2018-05-26 | Payer: Medicaid Other | Source: Ambulatory Visit

## 2018-05-26 ENCOUNTER — Ambulatory Visit: Payer: Disability Insurance

## 2018-05-26 ENCOUNTER — Encounter: Payer: Medicaid Other | Admitting: Obstetrics and Gynecology

## 2018-05-29 ENCOUNTER — Ambulatory Visit (INDEPENDENT_AMBULATORY_CARE_PROVIDER_SITE_OTHER): Payer: Medicaid Other | Admitting: Obstetrics and Gynecology

## 2018-05-29 ENCOUNTER — Encounter: Payer: Self-pay | Admitting: Obstetrics and Gynecology

## 2018-05-29 ENCOUNTER — Ambulatory Visit (INDEPENDENT_AMBULATORY_CARE_PROVIDER_SITE_OTHER): Payer: Medicaid Other

## 2018-05-29 ENCOUNTER — Other Ambulatory Visit: Payer: Self-pay

## 2018-05-29 VITALS — BP 134/74 | Wt 306.0 lb

## 2018-05-29 DIAGNOSIS — Z369 Encounter for antenatal screening, unspecified: Secondary | ICD-10-CM

## 2018-05-29 DIAGNOSIS — O98813 Other maternal infectious and parasitic diseases complicating pregnancy, third trimester: Secondary | ICD-10-CM

## 2018-05-29 DIAGNOSIS — A749 Chlamydial infection, unspecified: Secondary | ICD-10-CM

## 2018-05-29 DIAGNOSIS — N83209 Unspecified ovarian cyst, unspecified side: Secondary | ICD-10-CM

## 2018-05-29 DIAGNOSIS — O98811 Other maternal infectious and parasitic diseases complicating pregnancy, first trimester: Secondary | ICD-10-CM

## 2018-05-29 DIAGNOSIS — Z3A32 32 weeks gestation of pregnancy: Secondary | ICD-10-CM | POA: Diagnosis not present

## 2018-05-29 DIAGNOSIS — O0990 Supervision of high risk pregnancy, unspecified, unspecified trimester: Secondary | ICD-10-CM

## 2018-05-29 DIAGNOSIS — O0993 Supervision of high risk pregnancy, unspecified, third trimester: Secondary | ICD-10-CM | POA: Diagnosis not present

## 2018-05-29 DIAGNOSIS — Z6841 Body Mass Index (BMI) 40.0 and over, adult: Secondary | ICD-10-CM

## 2018-05-29 DIAGNOSIS — O3481 Maternal care for other abnormalities of pelvic organs, first trimester: Secondary | ICD-10-CM

## 2018-05-29 DIAGNOSIS — O99213 Obesity complicating pregnancy, third trimester: Secondary | ICD-10-CM

## 2018-05-29 DIAGNOSIS — O99210 Obesity complicating pregnancy, unspecified trimester: Secondary | ICD-10-CM

## 2018-05-29 NOTE — Progress Notes (Signed)
Routine Prenatal Care Visit  Subjective  Alexis Gutierrez is a 28 y.o. G2P1001 at [redacted]w[redacted]d being seen today for ongoing prenatal care.  She is currently monitored for the following issues for this high-risk pregnancy and has Supervision of high risk pregnancy, antepartum; Obesity in pregnancy; Morbid obesity with BMI of 50.0-59.9, adult (HCC); Chlamydia infection affecting pregnancy in first trimester; Ovarian cyst affecting pregnancy in first trimester, antepartum; and History of learning disability on their problem list.  ----------------------------------------------------------------------------------- Patient reports no complaints.   Contractions: Not present. Vag. Bleeding: None.  Movement: Present. Denies leaking of fluid.  Growth u/s today 1,966 grams (48th%ile), AFI 15.5 cm.  ----------------------------------------------------------------------------------- The following portions of the patient's history were reviewed and updated as appropriate: allergies, current medications, past family history, past medical history, past social history, past surgical history and problem list. Problem list updated.   Objective  Blood pressure 134/74, weight (!) 306 lb (138.8 kg), last menstrual period 10/19/2017. Pregravid weight 327 lb (148.3 kg) Total Weight Gain -21 lb (-9.526 kg) Urinalysis: Urine Protein    Urine Glucose    Fetal Status: Fetal Heart Rate (bpm): Present   Movement: Present  Presentation: Vertex  General:  Alert, oriented and cooperative. Patient is in no acute distress.  Skin: Skin is warm and dry. No rash noted.   Cardiovascular: Normal heart rate noted  Respiratory: Normal respiratory effort, no problems with respiration noted  Abdomen: Soft, gravid, appropriate for gestational age. Pain/Pressure: Absent     Pelvic:  Cervical exam deferred        Extremities: Normal range of motion.  Edema: None  Mental Status: Normal mood and affect. Normal behavior. Normal judgment and  thought content.   Imaging Results US Ob Limited  Result Date: 05/29/2018 Patient Name: Alexis Gutierrez DOB: Oct 12, 1990 MRN: [id number] ULTRASOUND REPORT Location: [facility] Date of Service: 05/29/2018 Indications:growth/afi Findings: Mason Jim intrauterine pregnancy is visualized with FHR at 147 BPM. Biometrics give an (U/S) Gestational age of [redacted]w[redacted]d and an (U/S) EDD of 07/22/2018; this correlates with the clinically established Estimated Date of Delivery: 07/22/2018. Fetal presentation is Cephalic. Placenta: posterior. Grade: 1 AFI: 15.45 cm Growth percentile is 48.3. EFW: 1,966 grams(4 lbs 5oz) Impression: 1. [redacted]w[redacted]d Viable Singleton Intrauterine pregnancy previously established criteria. 2. Growth is 48.3%ile.  AFI is 15.45 cm. Jenine M. Marciano Sequin    RDMS The ultrasound images and findings were reviewed by me and I agree with the above report. Thomasene Mohair, MD, [facility], [facility] 05/29/2018 4:56 PM       Assessment   27 y.o. G2P1001 at [redacted]w[redacted]d by  07/22/2018, by Ultrasound presenting for routine prenatal visit  Plan   Pregnancy #2 Problems (from 10/19/17 to present)    Problem Noted Resolved   Supervision of high risk pregnancy, antepartum 12/27/2017 by Farrel Conners, CNM No   Overview Addendum 05/19/2018 11:46 AM by Oswaldo Conroy, CNM    Clinic [facility] Prenatal Labs  Dating 8wk5d ultrasound Blood type: A/Positive/-- (10/10 1003)   Genetic Screen  NIPS: Normal XY Antibody:Negative (10/10 1003)  Anatomic [place] Complete at 23 weeks  Rubella: 3.70 (10/10 1003) Varicella: Immune  GTT Early:  91             Third trimester: 127 RPR: Non Reactive (10/10 1003)   Rhogam Not needed HBsAg: Negative (10/10 1003)   TDaP vaccine 05/19/2018  Flu Shot:12/15/17 HIV: Non Reactive (10/10 1003)   Baby Food                                GBS:   Contraception  Pap: NIL 2019  Anes consult 01/2018- approved; recheck 06/2018 Chlamydia positive 12/15/17  negative 01/21/18 TOC  CS/VBAC N.A   Support Person         Obesity in pregnancy 12/27/2017 by Farrel Conners, CNM No   Overview Addendum 05/29/2018  5:01 PM by Conard Novak, MD    BMI>50 BMI >=40 [x ] early 1h gtt -  [x ] u/s for dating [x ]  (x) anesthesia consult at 24 weeks [ ]  anesthesia c/s at 35-36 weeks [x]  nutritional goals [x]  folic acid 1mg  [x]  bASA (>12 weeks) [x]  consider nutrition consult [ ]  consider maternal EKG 1st trimester [ ]  Growth u/s 28 [ ] , 32 [x] , 36 weeks [ ]  [ ]  NST/AFI weekly 36+ weeks (36[] , 37[] , 38[] , 39[] , 40[] ) [ ]  IOL by 41 weeks (scheduled, prn [] )      Morbid obesity with BMI of 50.0-59.9, adult (HCC) 12/27/2017 by Farrel Conners, CNM No   Chlamydia infection affecting pregnancy in first trimester 12/27/2017 by Farrel Conners, CNM No   Ovarian cyst affecting pregnancy in first trimester, antepartum 12/27/2017 by Farrel Conners, CNM No   Overview Signed 12/27/2017  8:13 PM by Farrel Conners, CNM    8x 5.7 cm left ovarian cyst on 8 week ultrasound Follow up ultrasound at 12 weeks          Preterm labor symptoms and general obstetric precautions including but not limited to vaginal bleeding, contractions, leaking of fluid and fetal movement were reviewed in detail with the patient. Please refer to After Visit Summary for other counseling recommendations.   - pt already has anesthesia consult for 35 weeks.  - reviewed u/s and findings from today - discussed coronavirus today   Return in about 2 weeks (around 06/12/2018) for Routine Prenatal Appointment.  Thomasene Mohair, MD, [facility], [facility] 05/29/2018 4:59 PM

## 2018-06-05 ENCOUNTER — Other Ambulatory Visit: Payer: Self-pay | Admitting: Obstetrics and Gynecology

## 2018-06-05 DIAGNOSIS — B373 Candidiasis of vulva and vagina: Secondary | ICD-10-CM

## 2018-06-05 DIAGNOSIS — B3731 Acute candidiasis of vulva and vagina: Secondary | ICD-10-CM

## 2018-06-05 MED ORDER — FLUCONAZOLE 150 MG PO TABS: 150.0000 mg | ORAL_TABLET | ORAL | 1 refills | Status: AC

## 2018-06-12 ENCOUNTER — Other Ambulatory Visit: Payer: Self-pay

## 2018-06-12 ENCOUNTER — Encounter: Payer: Self-pay | Admitting: Obstetrics and Gynecology

## 2018-06-12 ENCOUNTER — Ambulatory Visit (INDEPENDENT_AMBULATORY_CARE_PROVIDER_SITE_OTHER): Payer: Medicaid Other | Admitting: Obstetrics and Gynecology

## 2018-06-12 VITALS — BP 110/54 | Wt 301.0 lb

## 2018-06-12 DIAGNOSIS — O99210 Obesity complicating pregnancy, unspecified trimester: Secondary | ICD-10-CM

## 2018-06-12 DIAGNOSIS — O0990 Supervision of high risk pregnancy, unspecified, unspecified trimester: Secondary | ICD-10-CM

## 2018-06-12 DIAGNOSIS — Z3A34 34 weeks gestation of pregnancy: Secondary | ICD-10-CM

## 2018-06-12 DIAGNOSIS — O26843 Uterine size-date discrepancy, third trimester: Secondary | ICD-10-CM

## 2018-06-12 DIAGNOSIS — Z6841 Body Mass Index (BMI) 40.0 and over, adult: Secondary | ICD-10-CM

## 2018-06-12 DIAGNOSIS — O4700 False labor before 37 completed weeks of gestation, unspecified trimester: Secondary | ICD-10-CM

## 2018-06-12 DIAGNOSIS — O99213 Obesity complicating pregnancy, third trimester: Secondary | ICD-10-CM

## 2018-06-12 DIAGNOSIS — O479 False labor, unspecified: Secondary | ICD-10-CM

## 2018-06-12 DIAGNOSIS — O4703 False labor before 37 completed weeks of gestation, third trimester: Secondary | ICD-10-CM

## 2018-06-12 LAB — POCT URINALYSIS DIPSTICK OB
Glucose, UA: NEGATIVE
POC,PROTEIN,UA: NEGATIVE

## 2018-06-12 NOTE — Progress Notes (Signed)
ROB

## 2018-06-12 NOTE — Progress Notes (Signed)
Routine Prenatal Care Visit  Subjective  Alexis Gutierrez is a 28 y.o. G2P1001 at [redacted]w[redacted]d being seen today for ongoing prenatal care.  She is currently monitored for the following issues for this high-risk pregnancy and has Supervision of high risk pregnancy, antepartum; Obesity in pregnancy; Morbid obesity with BMI of 50.0-59.9, adult (HCC); Chlamydia infection affecting pregnancy in first trimester; Ovarian cyst affecting pregnancy in first trimester, antepartum; and History of learning disability on their problem list.  ----------------------------------------------------------------------------------- Patient reports frequent contractions since yesterday. Is visually very comfortable.   Contractions: Irregular. Vag. Bleeding: None.  Movement: Present. Denies leaking of fluid.  ----------------------------------------------------------------------------------- The following portions of the patient's history were reviewed and updated as appropriate: allergies, current medications, past family history, past medical history, past social history, past surgical history and problem list. Problem list updated.   Objective  Blood pressure (!) 110/54, weight (!) 301 lb (136.5 kg), last menstrual period 10/19/2017. Pregravid weight 327 lb (148.3 kg) Total Weight Gain -26 lb (-11.8 kg) Urinalysis:      Fetal Status: Fetal Heart Rate (bpm): 144 Fundal Height: 40 cm Movement: Present     General:  Alert, oriented and cooperative. Patient is in no acute distress.  Skin: Skin is warm and dry. No rash noted.   Cardiovascular: Normal heart rate noted  Respiratory: Normal respiratory effort, no problems with respiration noted  Abdomen: Soft, gravid, appropriate for gestational age. Pain/Pressure: Present     Pelvic:  Cervical exam performed        Extremities: Normal range of motion.  Edema: None  Mental Status: Normal mood and affect. Normal behavior. Normal judgment and thought content.      Assessment   28 y.o. G2P1001 at [redacted]w[redacted]d by  07/22/2018, by Ultrasound presenting for routine prenatal visit  Plan   Pregnancy #2 Problems (from 10/19/17 to present)    Problem Noted Resolved   Supervision of high risk pregnancy, antepartum 12/27/2017 by Farrel Conners, CNM No   Overview Addendum 05/19/2018 11:46 AM by Oswaldo Conroy, CNM    [facility]inic [facility] Prenatal Labs  Dating 8wk5d ultrasound Blood type: A/Positive/-- (10/10 1003)   Genetic Screen  NIPS: Normal XY Antibody:Negative (10/10 1003)  Anatomic [place] Complete at 23 weeks  Rubella: 3.70 (10/10 1003) Varicella: Immune  GTT Early:  91             Third trimester: 127 RPR: Non Reactive (10/10 1003)   Rhogam Not needed HBsAg: Negative (10/10 1003)   TDaP vaccine 05/19/2018                       Flu Shot:12/15/17 HIV: Non Reactive (10/10 1003)   Baby Food                                GBS:   Contraception  Pap: NIL 2019  Anes consult 01/2018- approved; recheck 06/2018 Chlamydia positive 12/15/17 negative 01/21/18 TOC  CS/VBAC N.A   Support Person           Obesity in pregnancy 12/27/2017 by Farrel Conners, CNM No   Overview Addendum 05/29/2018  5:01 PM by Conard Novak, MD    BMI>50 BMI >=40 [x ] early 1h gtt -  [x ] u/s for dating [x ]  (x) anesthesia consult at 24 weeks [ ]  anesthesia c/s at 35-36 weeks [x]  nutritional goals [x]  folic acid 1mg  [x]  bASA (>12 weeks) [  x] consider nutrition consult [ ]  consider maternal EKG 1st trimester [ ]  Growth u/s 28 [ ] , 32 [x] , 36 weeks [ ]  [ ]  NST/AFI weekly 36+ weeks (36[] , 37[] , 38[] , 39[] , 40[] ) [ ]  IOL by 41 weeks (scheduled, prn [] )      Morbid obesity with BMI of 50.0-59.9, adult (HCC) 12/27/2017 by Farrel Conners, CNM No   Chlamydia infection affecting pregnancy in first trimester 12/27/2017 by Farrel Conners, CNM No   Ovarian cyst affecting pregnancy in first trimester, antepartum 12/27/2017 by Farrel Conners, CNM No   Overview Signed 12/27/2017   8:13 PM by Farrel Conners, CNM    8x 5.7 cm left ovarian cyst on 8 week ultrasound Follow up ultrasound at [redacted] weeks          Gestational age appropriate obstetric precautions including but not limited to vaginal bleeding, contractions, leaking of fluid and fetal movement were reviewed in detail with the patient.    Discussed rescheduled anesthesia appointment with patient Given Ready Set Baby book Given information about birth control options Patient having irregular contractions- SVE 0.5/long/-3  Weekly NST/AFI after 36 weeks Growth [place] at next visit   Return in about 2 weeks (around 06/26/2018) for ROB and [place].  Natale Milch MD [facility], [facility] 06/12/2018, 11:04 AM

## 2018-06-12 NOTE — Patient Instructions (Signed)
Hello,  Given the current COVID-19 pandemic, our practice is making changes in how we are providing care to our patients. We are limiting in-person visits for the safety of all of our patients.   As a practice, we have met to discuss the best way to minimize visits, but still provide excellent care to our expecting mothers.  We have decided on the following visit structure for low-risk pregnancies.  Initial Pregnancy visit will be conducted as a telephone or web visit.  Between 10-14 weeks  there will be one in-person visit for an ultrasound, lab work, and genetic screening. 20 weeks in-person visit with an anatomy ultrasound  28 weeks in-person office visit for a 1-hour glucose test and a TDAP vaccination 32 weeks in-person office visit 34 weeks telephone visit 36 weeks in-person office visit for GBS, chlamydia, and gonorrhea testing 38 weeks in-person office visit 40 weeks in-person office visit  Understandably, some patients will require more visits than what is outlined above. Additional visits will be determined on a case-by-case basis.   We will, as always, be available for emergencies or to address concerns that might arise between in-person visits. We ask that you allow [place] the opportunity to address any concerns over the phone or through a virtual visit first. We will be available to return your phone calls throughout the day.   If you are able to purchase a scale, a blood pressure machine, and a home fetal doppler visits could be limited further. This will help decrease your exposure risks, but these purchases are not a necessity.   Things seem to change daily and there is the possibility that this structure could change, please be patient as we adapt to a new way of caring for patients.   Thank you for trusting [place] with your prenatal care. Our practice values you and looks forward to providing you with excellent care.   Sincerely,   [facility], [place]     COVID-19 and Your Pregnancy FAQ  How can I prevent infection with COVID-19 during my pregnancy? Social distancing is key. Please limit any interactions in public. Try and work from home if possible. Frequently wash your hands after touching possibly contaminated surfaces. Avoid touching your face.  Minimize trips to the store. Consider online ordering when possible.   Should I wear a mask? Yes it is recommended that all people going out in public wear a cloth mask or bandana to cover their face and reduce transmission.    What are the symptoms of COVID-19? Fever (greater than 100.4 F), dry cough, shortness of breath.  Am I more at risk for COVID-19 since I am pregnant? There is not currently data showing that pregnant women are more adversely impacted by COVID-19 than the general population. However, we know that pregnant women tend to have worse respiratory complications from similar diseases such as the flu and SARS and for this reason should be considered an at-risk population.  What do I do if I am experiencing the symptoms of COVID-19? Testing is being limited because of test availability. If you are experiencing symptoms you should quarantine yourself, and the members of your family, for at least 2 weeks at home.   Please visit this website for more information: [email or web address].za.[email or web address]  When should I go to the Emergency Room? Please go to the emergency room if you are experiencing ANY of these symptoms*:  1.    Difficulty breathing or shortness of breath 2.  Persistent pain or pressure in the chest 3.    Confusion or difficulty being aroused (or awakened) 4.    Bluish lips or face  *This list is not all inclusive. Please consult our office for any other symptoms that are severe or concerning.  What do I do if I am having difficulty breathing? You should go to the Emergency Room for evaluation. At this time they  have a tent set up for evaluating patients with COVID-19 symptoms.   How will my prenatal care be different because of the COVID-19 pandemic? It has been recommended to reduce the frequency of face-to-face visits and use resources such as telephone and virtual visits when possible. Using a scale, blood pressure machine and fetal doppler at home can further help reduce face-to-face visits. You will be provided with additional information on this topic.  We ask that you come to your visits alone to minimize potential exposures to  COVID-19.  How can I receive childbirth education? At this time in-person classes have been cancelled. You can register for online childbirth education, breastfeeding, and newborn care classes.  Please visit:  CyberComps.hu for more information  How will my hospital birth experience be different? The hospital is currently limiting visitors. This means that while you are in labor you can only have one person at the hospital with you. Additional family members will not be allowed to wait in the building or outside your room. Your one support person can be the father of the baby, a relative, a doula, or a friend. Once one support person is designated that person will wear a band. This band cannot be shared with multiple people.  How long will I stay in the hospital for after giving birth? It is also recommended that discharge home be expedited during the COVID-19 outbreak. This means staying for 1 day after a vaginal delivery and 2 days after a cesarean section.  What if I have COVID-19 and I am in labor? We ask that you wear a mask while on labor and delivery. We will try and accommodate you being placed in a room that is capable of filtering the air. Please call ahead if you are in labor and on your way to the hospital. The phone number for labor and delivery at [facility] is [phone].  If I have COVID-19 when my baby is born how  can I prevent my baby from contracting COVID-19? This is an issue that will have to be discussed on a case-by-case basis. Current recommendations suggest providing separate isolation rooms for both the mother and new infant as well as limiting visitors. However, there are practical challenges to this recommendation. The situation will assuredly change and decisions will be influenced by the desires of the mother and availability of space.  Some suggestions are the use of a curtain or physical barrier between mom and infant, hand hygiene, mom wearing a mask, or 6 feet of spacing between a mom and infant.   Can I breastfeed during the COVID-19 pandemic?   Yes, breastfeeding is encouraged.  Can I breastfeed if I have COVID-19? Yes. Covid-19 has not been found in breast milk. This means you cannot give COVID-19 to your child through breast milk. Breast feeding will also help pass antibodies to fight infection to your baby.   What precautions should I take when breastfeeding if I have COVID-19? If a mother and newborn do room-in and the mother wishes to feed at the breast, she should  put on a facemask and practice hand hygiene before each feeding.  What precautions should I take when pumping if I have COVID-19? Prior to expressing breast milk, mothers should practice hand hygiene. After each pumping session, all parts that come into contact with breast milk should be thoroughly washed and the entire pump should be appropriately disinfected per the manufacturer's instructions. This expressed breast milk should be fed to the newborn by a healthy caregiver.  What if I am pregnant and work in healthcare? Based on limited data regarding COVID-19 and pregnancy, ACOG currently does not propose creating additional restrictions on pregnant health care personnel because of COVID-19 alone. Pregnant women do not appear to be at higher risk of severe disease related to COVID-19. Pregnant health care personnel should  follow CDC risk assessment and infection control guidelines for health care personnel exposed to patients with suspected or confirmed COVID-19. Adherence to recommended infection prevention and control practices is an important part of protecting all health care personnel in health care settings.    Information on COVID-19 in pregnancy is very limited; however, facilities may want to consider limiting exposure of pregnant health care personnel to patients with confirmed or suspected COVID-19 infection, especially during higher-risk procedures (eg, aerosol-generating procedures), if feasible, based on staffing availability.

## 2018-06-13 LAB — FETAL FIBRONECTIN: Fetal Fibronectin: NEGATIVE

## 2018-06-13 NOTE — Progress Notes (Signed)
Negative, sent to [facility]

## 2018-06-15 ENCOUNTER — Encounter
Admission: RE | Admit: 2018-06-15 | Discharge: 2018-06-15 | Disposition: A | Discharge status: Home / Self Care | Payer: Medicaid Other | Source: Ambulatory Visit | Attending: Anesthesiology | Admitting: Anesthesiology

## 2018-06-15 ENCOUNTER — Other Ambulatory Visit: Payer: Self-pay

## 2018-06-15 NOTE — Consult Note (Signed)
[facility] Anesthesia Consultation  Tomi Likensboni J Affeldt [id number] DOB: 10/16/1990 DOA: 06/15/2018 PCP: Leim FabryAldridge, Barbara, MD   Requesting physician: Dr. Jerene PitchSchuman Date of consultation: 06/15/18 Reason for consultation: Obesity during pregnancy  CHIEF COMPLAINT:  Obesity during pregnancy  HISTORY OF PRESENT ILLNESS: Alexis Gutierrez Koelzer  is a 28 y.o. female with a known history of obesity during pregnancy. This is her second consult with me, previously seen at [redacted] weeks gestation. Her weight has actually decreased since her last visit, 143kg at previous visit, 138kg at today's visit. Pregnancy has been going well, no new complications. Continues to plan for vaginal delivery with desire for epidural for labor analgesia.   PAST MEDICAL HISTORY:   Past Medical History:  Diagnosis Date  . Asthma     PAST SURGICAL HISTORY:  Past Surgical History:  Procedure Laterality Date  . TONSILLECTOMY      SOCIAL HISTORY:  Social History   Tobacco Use  . Smoking status: Former Games developermoker  . Smokeless tobacco: Never Used  Substance Use Topics  . Alcohol use: No    FAMILY HISTORY:  Family History  Problem Relation Age of Onset  . Breast cancer Paternal Aunt     DRUG ALLERGIES:  Allergies  Allergen Reactions  . Amoxicillin Shortness Of Breath    Has patient had a PCN reaction causing immediate rash, facial/tongue/throat swelling, SOB or lightheadedness with hypotension: Yes Has patient had a PCN reaction causing severe rash involving mucus membranes or skin necrosis: No Has patient had a PCN reaction that required hospitalization: No Has patient had a PCN reaction occurring within the last 10 years: No If all of the above answers are "NO", then may proceed with Cephalosporin use.  . Eggs Or Egg-Derived Products Other (See Comments)    Acid reflux  . Penicillins Swelling    Has patient had a PCN reaction causing immediate rash, facial/tongue/throat swelling, SOB or  lightheadedness with hypotension: Yes Has patient had a PCN reaction causing severe rash involving mucus membranes or skin necrosis: No Has patient had a PCN reaction that required hospitalization: No Has patient had a PCN reaction occurring within the last 10 years: No If all of the above answers are "NO", then may proceed with Cephalosporin use.   . Tomato Other (See Comments)    Acid reflux     REVIEW OF SYSTEMS:   RESPIRATORY: No cough, shortness of breath, wheezing.  CARDIOVASCULAR: No chest pain, orthopnea, edema.  HEMATOLOGY: No anemia, easy bruising or bleeding SKIN: No rash or lesion. NEUROLOGIC: No tingling, numbness, weakness.  PSYCHIATRY: No anxiety or depression.   MEDICATIONS AT HOME:  Prior to Admission medications   Medication Sig Start Date End Date Taking? Authorizing Provider  nitrofurantoin, macrocrystal-monohydrate, (MACROBID) 100 MG capsule Take 1 capsule (100 mg total) by mouth 2 (two) times daily. 02/11/18   Emily FilbertWilliams, Jonathan E, MD  PRENATAL 28-0.8 MG TABS Take by mouth. 12/05/17 12/05/18  [provider]  promethazine (PHENERGAN) 25 MG tablet Take 1 tablet (25 mg total) by mouth every 6 (six) hours as needed for nausea or vomiting. 03/10/18   Natale MilchSchuman, Christanna R, MD      PHYSICAL EXAMINATION:   VITAL SIGNS: Blood pressure 115/83, pulse 85, temperature 36.7 C, temperature source Oral, height 5' (1.524 m), weight (!) 138.8 kg, last menstrual period 10/19/2017, SpO2 100 %.  GENERAL:  28 y.o.-year-old patient no acute distress.  HEENT: Head atraumatic, normocephalic. Oropharynx and nasopharynx clear. MP 2, TM distance >3 cm, normal mouth opening.  LUNGS: Normal breath sounds bilaterally, no wheezing, rales,rhonchi. No use of accessory muscles of respiration.  CARDIOVASCULAR: S1, S2 normal. No murmurs, rubs, or gallops.  EXTREMITIES: No pedal edema, cyanosis, or clubbing.  NEUROLOGIC: normal gait PSYCHIATRIC: The patient is alert and oriented x 3.   SKIN: No obvious rash, lesion, or ulcer.    IMPRESSION AND PLAN:   Alexis Gutierrez  is a 28 y.o. female presenting with obesity during pregnancy. BMI is currently 59 at [redacted] weeks gestation, weight actually decreased from previous exam at [redacted] weeks gestation.   Airway exam remains reassuring. Significant back adiposity but midline palpable.  We discussed analgesic options during labor including epidural analgesia. Discussed that in obesity there can be increased difficulty with epidural placement or even failure of successful epidural. We also discussed that even after successful epidural placement there is increased risk of catheter migration out of the epidural space that would require catheter replacement. Discussed use of epidural vs spinal vs GA if cesarean delivery is required. Discussed increased risk of difficult intubation during pregnancy should an emergency cesarean delivery be required.   Plan for delivery at [facility].

## 2018-06-19 ENCOUNTER — Inpatient Hospital Stay: Admission: RE | Admit: 2018-06-19 | Payer: Disability Insurance | Source: Ambulatory Visit

## 2018-06-26 ENCOUNTER — Other Ambulatory Visit: Payer: Self-pay

## 2018-06-26 ENCOUNTER — Ambulatory Visit (INDEPENDENT_AMBULATORY_CARE_PROVIDER_SITE_OTHER): Payer: Medicaid Other | Admitting: Maternal Newborn

## 2018-06-26 ENCOUNTER — Other Ambulatory Visit (HOSPITAL_COMMUNITY)
Admission: RE | Admit: 2018-06-26 | Discharge: 2018-06-26 | Disposition: A | Discharge status: Home / Self Care | Payer: Medicaid Other | Source: Ambulatory Visit | Attending: Maternal Newborn | Admitting: Maternal Newborn

## 2018-06-26 ENCOUNTER — Encounter: Payer: Self-pay | Admitting: Maternal Newborn

## 2018-06-26 ENCOUNTER — Ambulatory Visit (INDEPENDENT_AMBULATORY_CARE_PROVIDER_SITE_OTHER): Payer: Medicaid Other

## 2018-06-26 VITALS — BP 108/64 | Wt 303.0 lb

## 2018-06-26 DIAGNOSIS — O0990 Supervision of high risk pregnancy, unspecified, unspecified trimester: Secondary | ICD-10-CM | POA: Diagnosis present

## 2018-06-26 DIAGNOSIS — Z3A36 36 weeks gestation of pregnancy: Secondary | ICD-10-CM

## 2018-06-26 DIAGNOSIS — O99213 Obesity complicating pregnancy, third trimester: Secondary | ICD-10-CM | POA: Diagnosis not present

## 2018-06-26 DIAGNOSIS — Z362 Encounter for other antenatal screening follow-up: Secondary | ICD-10-CM

## 2018-06-26 DIAGNOSIS — O26843 Uterine size-date discrepancy, third trimester: Secondary | ICD-10-CM

## 2018-06-26 DIAGNOSIS — Z369 Encounter for antenatal screening, unspecified: Secondary | ICD-10-CM

## 2018-06-26 LAB — POCT URINALYSIS DIPSTICK OB: Glucose, UA: NEGATIVE

## 2018-06-26 LAB — FETAL NONSTRESS TEST

## 2018-06-26 NOTE — Patient Instructions (Signed)
Third Trimester of Pregnancy The third trimester is from week 28 through week 40 (months 7 through 9). The third trimester is a time when the unborn baby (fetus) is growing rapidly. At the end of the ninth month, the fetus is about 20 inches in length and weighs 6-10 pounds. Body changes during your third trimester Your body will continue to go through many changes during pregnancy. The changes vary from woman to woman. During the third trimester:  Your weight will continue to increase. You can expect to gain 25-35 pounds (11-16 kg) by the end of the pregnancy.  You may begin to get stretch marks on your hips, abdomen, and breasts.  You may urinate more often because the fetus is moving lower into your pelvis and pressing on your bladder.  You may develop or continue to have heartburn. This is caused by increased hormones that slow down muscles in the digestive tract.  You may develop or continue to have constipation because increased hormones slow digestion and cause the muscles that push waste through your intestines to relax.  You may develop hemorrhoids. These are swollen veins (varicose veins) in the rectum that can itch or be painful.  You may develop swollen, bulging veins (varicose veins) in your legs.  You may have increased body aches in the pelvis, back, or thighs. This is due to weight gain and increased hormones that are relaxing your joints.  You may have changes in your hair. These can include thickening of your hair, rapid growth, and changes in texture. Some women also have hair loss during or after pregnancy, or hair that feels dry or thin. Your hair will most likely return to normal after your baby is born.  Your breasts will continue to grow and they will continue to become tender. A yellow fluid (colostrum) may leak from your breasts. This is the first milk you are producing for your baby.  Your belly button may stick out.  You may notice more swelling in your hands,  face, or ankles.  You may have increased tingling or numbness in your hands, arms, and legs. The skin on your belly may also feel numb.  You may feel short of breath because of your expanding uterus.  You may have more problems sleeping. This can be caused by the size of your belly, increased need to urinate, and an increase in your body's metabolism.  You may notice the fetus "dropping," or moving lower in your abdomen (lightening).  You may have increased vaginal discharge.  You may notice your joints feel loose and you may have pain around your pelvic bone. What to expect at prenatal visits You will have prenatal exams every 2 weeks until week 36. Then you will have weekly prenatal exams. During a routine prenatal visit:  You will be weighed to make sure you and the baby are growing normally.  Your blood pressure will be taken.  Your abdomen will be measured to track your baby's growth.  The fetal heartbeat will be listened to.  Any test results from the previous visit will be discussed.  You may have a cervical check near your due date to see if your cervix has softened or thinned (effaced).  You will be tested for Group B streptococcus. This happens between 35 and 37 weeks. Your health care provider may ask you:  What your birth plan is.  How you are feeling.  If you are feeling the baby move.  If you have had any abnormal   symptoms, such as leaking fluid, bleeding, severe headaches, or abdominal cramping.  If you are using any tobacco products, including cigarettes, chewing tobacco, and electronic cigarettes.  If you have any questions. Other tests or screenings that may be performed during your third trimester include:  Blood tests that check for low iron levels (anemia).  Fetal testing to check the health, activity level, and growth of the fetus. Testing is done if you have certain medical conditions or if there are problems during the pregnancy.  Nonstress test  (NST). This test checks the health of your baby to make sure there are no signs of problems, such as the baby not getting enough oxygen. During this test, a belt is placed around your belly. The baby is made to move, and its heart rate is monitored during movement. What is false labor? False labor is a condition in which you feel small, irregular tightenings of the muscles in the womb (contractions) that usually go away with rest, changing position, or drinking water. These are called Braxton Hicks contractions. Contractions may last for hours, days, or even weeks before true labor sets in. If contractions come at regular intervals, become more frequent, increase in intensity, or become painful, you should see your health care provider. What are the signs of labor?  Abdominal cramps.  Regular contractions that start at 10 minutes apart and become stronger and more frequent with time.  Contractions that start on the top of the uterus and spread down to the lower abdomen and back.  Increased pelvic pressure and dull back pain.  A watery or bloody mucus discharge that comes from the vagina.  Leaking of amniotic fluid. This is also known as your "water breaking." It could be a slow trickle or a gush. Let your health care provider know if it has a color or strange odor. If you have any of these signs, call your health care provider right away, even if it is before your due date. Follow these instructions at home: Medicines  Follow your health care provider's instructions regarding medicine use. Specific medicines may be either safe or unsafe to take during pregnancy.  Take a prenatal vitamin that contains at least 600 micrograms (mcg) of folic acid.  If you develop constipation, try taking a stool softener if your health care provider approves. Eating and drinking   Eat a balanced diet that includes fresh fruits and vegetables, whole grains, good sources of protein such as meat, eggs, or tofu,  and low-fat dairy. Your health care provider will help you determine the amount of weight gain that is right for you.  Avoid raw meat and uncooked cheese. These carry germs that can cause birth defects in the baby.  If you have low calcium intake from food, talk to your health care provider about whether you should take a daily calcium supplement.  Eat four or five small meals rather than three large meals a day.  Limit foods that are high in fat and processed sugars, such as fried and sweet foods.  To prevent constipation: ? Drink enough fluid to keep your urine clear or pale yellow. ? Eat foods that are high in fiber, such as fresh fruits and vegetables, whole grains, and beans. Activity  Exercise only as directed by your health care provider. Most women can continue their usual exercise routine during pregnancy. Try to exercise for 30 minutes at least 5 days a week. Stop exercising if you experience uterine contractions.  Avoid heavy lifting.  Do   not exercise in extreme heat or humidity, or at high altitudes.  Wear low-heel, comfortable shoes.  Practice good posture.  You may continue to have sex unless your health care provider tells you otherwise. Relieving pain and discomfort  Take frequent breaks and rest with your legs elevated if you have leg cramps or low back pain.  Take warm sitz baths to soothe any pain or discomfort caused by hemorrhoids. Use hemorrhoid cream if your health care provider approves.  Wear a good support bra to prevent discomfort from breast tenderness.  If you develop varicose veins: ? Wear support pantyhose or compression stockings as told by your healthcare provider. ? Elevate your feet for 15 minutes, 3-4 times a day. Prenatal care  Write down your questions. Take them to your prenatal visits.  Keep all your prenatal visits as told by your health care provider. This is important. Safety  Wear your seat belt at all times when driving.  Make  a list of emergency phone numbers, including numbers for family, friends, the hospital, and police and fire departments. General instructions  Avoid cat litter boxes and soil used by cats. These carry germs that can cause birth defects in the baby. If you have a cat, ask someone to clean the litter box for you.  Do not travel far distances unless it is absolutely necessary and only with the approval of your health care provider.  Do not use hot tubs, steam rooms, or saunas.  Do not drink alcohol.  Do not use any products that contain nicotine or tobacco, such as cigarettes and e-cigarettes. If you need help quitting, ask your health care provider.  Do not use any medicinal herbs or unprescribed drugs. These chemicals affect the formation and growth of the baby.  Do not douche or use tampons or scented sanitary pads.  Do not cross your legs for long periods of time.  To prepare for the arrival of your baby: ? Take prenatal classes to understand, practice, and ask questions about labor and delivery. ? Make a trial run to the hospital. ? Visit the hospital and tour the maternity area. ? Arrange for maternity or paternity leave through employers. ? Arrange for family and friends to take care of pets while you are in the hospital. ? Purchase a rear-facing car seat and make sure you know how to install it in your car. ? Pack your hospital bag. ? Prepare the baby's nursery. Make sure to remove all pillows and stuffed animals from the baby's crib to prevent suffocation.  Visit your dentist if you have not gone during your pregnancy. Use a soft toothbrush to brush your teeth and be gentle when you floss. Contact a health care provider if:  You are unsure if you are in labor or if your water has broken.  You become dizzy.  You have mild pelvic cramps, pelvic pressure, or nagging pain in your abdominal area.  You have lower back pain.  You have persistent nausea, vomiting, or  diarrhea.  You have an unusual or bad smelling vaginal discharge.  You have pain when you urinate. Get help right away if:  Your water breaks before 37 weeks.  You have regular contractions less than 5 minutes apart before 37 weeks.  You have a fever.  You are leaking fluid from your vagina.  You have spotting or bleeding from your vagina.  You have severe abdominal pain or cramping.  You have rapid weight loss or weight gain.  You have   shortness of breath with chest pain.  You notice sudden or extreme swelling of your face, hands, ankles, feet, or legs.  Your baby makes fewer than 10 movements in 2 hours.  You have severe headaches that do not go away when you take medicine.  You have vision changes. Summary  The third trimester is from week 28 through week 40, months 7 through 9. The third trimester is a time when the unborn baby (fetus) is growing rapidly.  During the third trimester, your discomfort may increase as you and your baby continue to gain weight. You may have abdominal, leg, and back pain, sleeping problems, and an increased need to urinate.  During the third trimester your breasts will keep growing and they will continue to become tender. A yellow fluid (colostrum) may leak from your breasts. This is the first milk you are producing for your baby.  False labor is a condition in which you feel small, irregular tightenings of the muscles in the womb (contractions) that eventually go away. These are called Braxton Hicks contractions. Contractions may last for hours, days, or even weeks before true labor sets in.  Signs of labor can include: abdominal cramps; regular contractions that start at 10 minutes apart and become stronger and more frequent with time; watery or bloody mucus discharge that comes from the vagina; increased pelvic pressure and dull back pain; and leaking of amniotic fluid. This information is not intended to replace advice given to you by your  health care provider. Make sure you discuss any questions you have with your health care provider. Document Released: 02/16/2001 Document Revised: 03/30/2016 Document Reviewed: 03/30/2016 Elsevier Interactive Patient Education  2019 [employer]  

## 2018-06-26 NOTE — Progress Notes (Signed)
GBS/Aptima/Growth scan today.

## 2018-06-26 NOTE — Progress Notes (Signed)
Routine Prenatal Care Visit  Subjective  Alexis Gutierrez is a 28 y.o. G2P1001 at 3554w2d being seen today for ongoing prenatal care.  She is currently monitored for the following issues for this high-risk pregnancy and has Supervision of high risk pregnancy, antepartum; Obesity in pregnancy; Morbid obesity with BMI of 50.0-59.9, adult (HCC); Chlamydia infection affecting pregnancy in first trimester; Ovarian cyst affecting pregnancy in first trimester, antepartum; and History of learning disability on their problem list.  ----------------------------------------------------------------------------------- Patient reports that she is having contractions daily. They were closer together over the weekend but have spaced out today. She feels pelvic pressure. Contractions: Irregular. Vag. Bleeding: None.  Movement: Present. No leaking of fluid.  ----------------------------------------------------------------------------------- The following portions of the patient's history were reviewed and updated as appropriate: allergies, current medications, past family history, past medical history, past social history, past surgical history and problem list. Problem list updated.  Objective  Blood pressure 108/64, weight (!) 303 lb (137.4 kg), last menstrual period 10/19/2017. Pregravid weight 327 lb (148.3 kg) Total Weight Gain -24 lb (-10.9 kg)   Urinalysis: Urine dipstick shows negative for glucose, positive for protein (trace).  Fetal Status: Fetal Heart Rate (bpm): 141   Movement: Present  Presentation: Vertex  General:  Alert, oriented and cooperative. Patient is in no acute distress.  Skin: Skin is warm and dry. No rash noted.   Cardiovascular: Normal heart rate noted  Respiratory: Normal respiratory effort, no problems with respiration noted  Abdomen: Soft, gravid, appropriate for gestational age. Pain/Pressure: Present     Pelvic:  Cervical exam performed Dilation: Fingertip Effacement (%): Thick  Station: -3  Extremities: Normal range of motion.  Edema: None  Mental Status: Normal mood and affect. Normal behavior. Normal judgment and thought content.   NST Baseline: 145 Variability: moderate Accelerations: present Decelerations: absent Tocometry: not done The patient was monitored for 20+ minutes, fetal heart rate tracing was deemed reactive.  Assessment   28 y.o. G2P1001 at 5254w2d, EDD 07/22/2018 by Ultrasound presenting for a routine prenatal visit.  Plan   Pregnancy #2 Problems (from 10/19/17 to present)    Problem Noted Resolved   Supervision of high risk pregnancy, antepartum 12/27/2017 by Farrel ConnersGutierrez, Colleen, CNM No   Overview Addendum 06/12/2018 11:20 AM by Natale MilchSchuman, Christanna R, MD    [facility]  Dating 8wk5d ultrasound Blood type: A/Positive/-- (10/10 1003)   Genetic Screen  NIPS: Normal XY Antibody:Negative (10/10 1003)  Anatomic US Complete at 23 weeks  Rubella: 3.70 (10/10 1003) Varicella: Immune  GTT Early:  91             Third trimester: 127 RPR: Non Reactive (10/10 1003)   Rhogam Not needed HBsAg: Negative (10/10 1003)   TDaP vaccine 05/19/2018                       Flu Shot:12/15/17 HIV: Non Reactive (10/10 1003)   Baby Food Breast- had some difficulty breast feeding her daughter once she went home from hospital                                 GBS:   Contraception  unsure- considering IUD Pap: NIL 2019  Anes consult 01/2018- approved; recheck 06/2018 Chlamydia positive 12/15/17 negative 01/21/18 TOC  CS/VBAC N.A   Support Person           Obesity in pregnancy 12/27/2017 by Farrel ConnersGutierrez, Colleen,  CNM No   Overview Addendum 05/29/2018  5:01 PM by Conard Novak, MD    BMI>50 BMI >=40 [x ] early 1h gtt -  [x ] u/s for dating [x ]  (x) anesthesia consult at 24 weeks [ ]  anesthesia c/s at 35-36 weeks [x]  nutritional goals [x]  folic acid 1mg  [x]  bASA (>12 weeks) [x]  consider nutrition consult [ ]  consider maternal EKG 1st trimester [  ] Growth u/s 28 [ ] , 32 [x] , 36 weeks [ ]  [ ]  NST/AFI weekly 36+ weeks (36[] , 37[] , 38[] , 39[] , 40[] ) [ ]  IOL by 41 weeks (scheduled, prn [] )      Morbid obesity with BMI of 50.0-59.9, adult (HCC) 12/27/2017 by Farrel Conners, CNM No   Chlamydia infection affecting pregnancy in first trimester 12/27/2017 by Farrel Conners, CNM No   Ovarian cyst affecting pregnancy in first trimester, antepartum 12/27/2017 by Farrel Conners, CNM No   Overview Signed 12/27/2017  8:13 PM by Farrel Conners, CNM    8x 5.7 cm left ovarian cyst on 8 week ultrasound Follow up ultrasound at 12 weeks       NST reactive. Growth scan shows EFW 6 lb 3 oz, growth percentile 42.6%, cephalic presentation, AFI 10.34 cm.  GBS/Aptima done today. Cervix unchanged from last exam, Encouraged rest/hydration.  Preterm labor symptoms and general obstetric precautions including but not limited to vaginal bleeding, contractions, leaking of fluid and fetal movement were reviewed.  Please refer to After Visit Summary for other counseling recommendations.   Return in about 1 week (around 07/03/2018) for ROB with NST/AFI.  Marcelyn Bruins, CNM 06/26/2018

## 2018-06-28 ENCOUNTER — Encounter: Payer: Self-pay | Admitting: Obstetrics and Gynecology

## 2018-06-28 ENCOUNTER — Ambulatory Visit (INDEPENDENT_AMBULATORY_CARE_PROVIDER_SITE_OTHER): Payer: Medicaid Other | Admitting: Obstetrics and Gynecology

## 2018-06-28 ENCOUNTER — Other Ambulatory Visit: Payer: Self-pay

## 2018-06-28 VITALS — BP 128/88

## 2018-06-28 DIAGNOSIS — O99213 Obesity complicating pregnancy, third trimester: Secondary | ICD-10-CM

## 2018-06-28 DIAGNOSIS — Z6841 Body Mass Index (BMI) 40.0 and over, adult: Secondary | ICD-10-CM

## 2018-06-28 DIAGNOSIS — Z3A36 36 weeks gestation of pregnancy: Secondary | ICD-10-CM

## 2018-06-28 DIAGNOSIS — R102 Pelvic and perineal pain: Secondary | ICD-10-CM

## 2018-06-28 DIAGNOSIS — O26893 Other specified pregnancy related conditions, third trimester: Secondary | ICD-10-CM

## 2018-06-28 DIAGNOSIS — O98811 Other maternal infectious and parasitic diseases complicating pregnancy, first trimester: Secondary | ICD-10-CM

## 2018-06-28 DIAGNOSIS — O26899 Other specified pregnancy related conditions, unspecified trimester: Secondary | ICD-10-CM | POA: Insufficient documentation

## 2018-06-28 DIAGNOSIS — A749 Chlamydial infection, unspecified: Secondary | ICD-10-CM

## 2018-06-28 DIAGNOSIS — O0990 Supervision of high risk pregnancy, unspecified, unspecified trimester: Secondary | ICD-10-CM

## 2018-06-28 LAB — CERVICOVAGINAL ANCILLARY ONLY
Chlamydia: NEGATIVE
Neisseria Gonorrhea: NEGATIVE

## 2018-06-28 NOTE — Progress Notes (Signed)
Routine Prenatal Care Visit  Subjective  Alexis Gutierrez is a 28 y.o. G2P1001 at 4045w4d being seen today for ongoing prenatal care.  She is currently monitored for the following issues for this high-risk pregnancy and has Supervision of high risk pregnancy, antepartum; Obesity in pregnancy; Morbid obesity with BMI of 50.0-59.9, adult (HCC); Chlamydia infection affecting pregnancy in first trimester; Ovarian cyst affecting pregnancy in first trimester, antepartum; History of learning disability; and Pelvic pain affecting pregnancy on their problem list.  ----------------------------------------------------------------------------------- Patient reports pelvic pain (not pressure) that started 3 days ago.  She states that she had contractions on and off last Thursday (6 days ago) through Sunday.  Then, the pelvic pain began. She states that it is located in the crease of her legs and in her pubic bone.  The pain is bad enough that she has a hard time walking and standing.  Alleviating factors: lying down. Aggravating factors: standing and walking. Associated symptoms: none.    Contractions: Irritability. Vag. Bleeding: None.  Movement: Present. Denies leaking of fluid.  ----------------------------------------------------------------------------------- The following portions of the patient's history were reviewed and updated as appropriate: allergies, current medications, past family history, past medical history, past social history, past surgical history and problem list. Problem list updated.   Objective  Blood pressure 128/88, last menstrual period 10/19/2017. Pregravid weight 327 lb (148.3 kg) Total Weight Gain -24 lb (-10.9 kg) Urinalysis: Urine Protein    Urine Glucose    Fetal Status: Fetal Heart Rate (bpm): 140   Movement: Present     General:  Alert, oriented and cooperative. Patient is in no acute distress.  Skin: Skin is warm and dry. No rash noted.   Cardiovascular: Normal heart rate  noted  Respiratory: Normal respiratory effort, no problems with respiration noted  Abdomen: Soft, gravid, appropriate for gestational age. Pain/Pressure: Present , mild ttp over pubic symphysis.  No tenderness over uterus.    Pelvic:  Cervical exam deferred        Extremities: Normal range of motion.  Edema: None  Mental Status: Normal mood and affect. Normal behavior. Normal judgment and thought content.   Assessment   28 y.o. G2P1001 at 3945w4d by  07/22/2018, by Ultrasound presenting for work-in prenatal visit  Plan   Pregnancy #2 Problems (from 10/19/17 to present)    Problem Noted Resolved   Pelvic pain affecting pregnancy 06/28/2018 by Conard NovakJackson, Blenda Wisecup D, MD No   Supervision of high risk pregnancy, antepartum 12/27/2017 by Farrel ConnersGutierrez, Colleen, CNM No   Overview Addendum 06/12/2018 11:20 AM by Natale MilchSchuman, Christanna R, MD    [facility]  Dating 8wk5d ultrasound Blood type: A/Positive/-- (10/10 1003)   Genetic Screen  NIPS: Normal XY Antibody:Negative (10/10 1003)  Anatomic US Complete at 23 weeks  Rubella: 3.70 (10/10 1003) Varicella: Immune  GTT Early:  91             Third trimester: 127 RPR: Non Reactive (10/10 1003)   Rhogam Not needed HBsAg: Negative (10/10 1003)   TDaP vaccine 05/19/2018                       Flu Shot:12/15/17 HIV: Non Reactive (10/10 1003)   Baby Food Breast- had some difficulty breast feeding her daughter once she went home from hospital                                 GBS:  Contraception  unsure- considering IUD Pap: NIL 2019  Anes consult 01/2018- approved; recheck 06/2018 Chlamydia positive 12/15/17 negative 01/21/18 TOC  CS/VBAC N.A   Support Person           Obesity in pregnancy 12/27/2017 by Farrel Conners, CNM No   Overview Addendum 05/29/2018  5:01 PM by Conard Novak, MD    BMI>50 BMI >=40 [x ] early 1h gtt -  [x ] u/s for dating [x ]  (x) anesthesia consult at 24 weeks [ ]  anesthesia c/s at 35-36 weeks [x]  nutritional  goals [x]  folic acid 1mg  [x]  bASA (>12 weeks) [x]  consider nutrition consult [ ]  consider maternal EKG 1st trimester [ ]  Growth u/s 28 [ ] , 32 [x] , 36 weeks [ ]  [ ]  NST/AFI weekly 36+ weeks (36[] , 37[] , 38[] , 39[] , 40[] ) [ ]  IOL by 41 weeks (scheduled, prn [] )      Morbid obesity with BMI of 50.0-59.9, adult (HCC) 12/27/2017 by Farrel Conners, CNM No   Chlamydia infection affecting pregnancy in first trimester 12/27/2017 by Farrel Conners, CNM No   Ovarian cyst affecting pregnancy in first trimester, antepartum 12/27/2017 by Farrel Conners, CNM No   Overview Signed 12/27/2017  8:13 PM by Farrel Conners, CNM    8x 5.7 cm left ovarian cyst on 8 week ultrasound Follow up ultrasound at 12 weeks          Preterm labor symptoms and general obstetric precautions including but not limited to vaginal bleeding, contractions, leaking of fluid and fetal movement were reviewed in detail with the patient. Please refer to After Visit Summary for other counseling recommendations.   - Discussed conservative measures to alleviate pain.  - encouraged her to move as much as she can tolerate to prevent VTE.  We discussed this at length.   - Keep f/u appt in one week. - if sx worsen, contact us.  - continue fetal movement counts.  Return in about 1 week (around 07/05/2018) for Keep previously scheduled appointment.  Thomasene Mohair, MD, [facility], [facility] 06/28/2018 5:10 PM

## 2018-07-03 LAB — STREP GP B SUSCEPTIBILITY

## 2018-07-03 LAB — STREP GP B CULTURE+RFLX: Strep Gp B Culture+Rflx: POSITIVE — AB

## 2018-07-05 ENCOUNTER — Ambulatory Visit (INDEPENDENT_AMBULATORY_CARE_PROVIDER_SITE_OTHER): Payer: Medicaid Other | Admitting: Obstetrics & Gynecology

## 2018-07-05 ENCOUNTER — Ambulatory Visit (INDEPENDENT_AMBULATORY_CARE_PROVIDER_SITE_OTHER): Payer: Medicaid Other

## 2018-07-05 ENCOUNTER — Encounter: Payer: Self-pay | Admitting: Obstetrics & Gynecology

## 2018-07-05 ENCOUNTER — Other Ambulatory Visit: Payer: Self-pay

## 2018-07-05 VITALS — BP 120/80 | Wt 305.0 lb

## 2018-07-05 DIAGNOSIS — R102 Pelvic and perineal pain: Secondary | ICD-10-CM | POA: Diagnosis not present

## 2018-07-05 DIAGNOSIS — Z6841 Body Mass Index (BMI) 40.0 and over, adult: Secondary | ICD-10-CM

## 2018-07-05 DIAGNOSIS — Z3A37 37 weeks gestation of pregnancy: Secondary | ICD-10-CM | POA: Diagnosis not present

## 2018-07-05 DIAGNOSIS — Z3689 Encounter for other specified antenatal screening: Secondary | ICD-10-CM

## 2018-07-05 DIAGNOSIS — O99213 Obesity complicating pregnancy, third trimester: Secondary | ICD-10-CM | POA: Diagnosis not present

## 2018-07-05 DIAGNOSIS — O26893 Other specified pregnancy related conditions, third trimester: Secondary | ICD-10-CM

## 2018-07-05 DIAGNOSIS — Z369 Encounter for antenatal screening, unspecified: Secondary | ICD-10-CM

## 2018-07-05 DIAGNOSIS — O0990 Supervision of high risk pregnancy, unspecified, unspecified trimester: Secondary | ICD-10-CM

## 2018-07-05 LAB — FETAL NONSTRESS TEST

## 2018-07-05 LAB — POCT URINALYSIS DIPSTICK OB: Glucose, UA: NEGATIVE

## 2018-07-05 NOTE — Progress Notes (Signed)
Subjective  Fetal Movement? yes Contractions? no Leaking Fluid? no Vaginal Bleeding? no  Objective  BP 120/80   Wt (!) 305 lb (138.3 kg)   LMP 10/19/2017 (Within Days)   BMI 59.57 kg/m  General: NAD Pumonary: no increased work of breathing Abdomen: gravid, non-tender Extremities: no edema Psychiatric: mood appropriate, affect full A NST procedure was performed with FHR monitoring and a normal baseline established, appropriate time of 20-40 minutes of evaluation, and accels >2 seen w 15x15 characteristics.  Results show a REACTIVE NST.   Review of ULTRASOUND.    I have personally reviewed images and report of recent ultrasound done at [facility].    Plan of management to be discussed with patient.  Assessment  28 y.o. G2P1001 at [redacted]w[redacted]d by  07/22/2018, by Ultrasound presenting for routine prenatal visit  Plan   Problem List Items Addressed This Visit      Other   Supervision of high risk pregnancy, antepartum   Relevant Orders   US OB Limited   Morbid obesity with BMI of 50.0-59.9, adult (HCC)   Relevant Orders   US OB Limited    Other Visit Diagnoses    [redacted] weeks gestation of pregnancy    -  Primary   Relevant Orders   POC Urinalysis Dipstick OB (Completed)      Pregnancy #2 Problems (from 10/19/17 to present)    Problem Noted Resolved   Pelvic pain affecting pregnancy 06/28/2018 by Conard Novak, MD No   Supervision of high risk pregnancy, antepartum 12/27/2017 by Farrel Conners, CNM No   Overview Addendum 07/05/2018  9:08 AM by Nadara Mustard, MD    [facility]  Dating 8wk5d ultrasound Blood type: --/--/A POS Performed at [facility], [address] [place], [place] [address]  [phone]/07 1943)   Genetic Screen  NIPS: Normal XY Antibody:Negative (10/10 1003)  Anatomic [place] Complete at 23 weeks  Rubella: 3.70 (10/10 1003) Varicella: Immune  GTT Early:  91             Third trimester: 127 RPR: Non Reactive (02/20 0954)   Rhogam Not  needed HBsAg: Negative (10/10 1003)   TDaP vaccine 05/19/2018                       Flu Shot:12/15/17 HIV: Non Reactive (02/20 0954)   Baby Food Breast- had some difficulty breast feeding her daughter once she went home from hospital                                 GBS: POS  Contraception  unsure- considering IUD Pap: NIL 2019  Anes consult 01/2018- approved; recheck 06/2018 Chlamydia positive 12/15/17 negative 01/21/18 TOC  CS/VBAC N.A   Support Person           Obesity in pregnancy 12/27/2017 by Farrel Conners, CNM No   Overview Addendum 05/29/2018  5:01 PM by Conard Novak, MD    BMI>50 BMI >=40 [x ] early 1h gtt -  [x ] u/s for dating [x ]  (x) anesthesia consult at 24 weeks [x ] anesthesia c/s at 35-36 weeks [x]  nutritional goals [x]  folic acid 1mg  [x]  bASA (>12 weeks) [x]  consider nutrition consult [ ]  consider maternal EKG 1st trimester [ x] Growth u/s 28 [ ] , 32 [x] , 36 weeks [x ] [x ] NST/AFI weekly 36+ weeks (36[x] , 37[x] , 38[] , 39[] , 40[] ) [ ]   IOL by 41 weeks (scheduled, prn [] )      Morbid obesity with BMI of 50.0-59.9, adult (HCC) 12/27/2017 by Farrel ConnersGutierrez, Colleen, CNM No   Chlamydia infection affecting pregnancy in first trimester 12/27/2017 by Farrel ConnersGutierrez, Colleen, CNM No   Ovarian cyst affecting pregnancy in first trimester, antepartum 12/27/2017 by Farrel ConnersGutierrez, Colleen, CNM No   Overview Signed 12/27/2017  8:13 PM by Farrel ConnersGutierrez, Colleen, CNM    8x 5.7 cm left ovarian cyst on 8 week ultrasound Follow up ultrasound at 12 weeks        Discussed labor plans even IOL if after 40 weeks and needed  Annamarie MajorPaul Emrik Erhard, MD, [facility], [facility] 07/05/2018  9:18 AM

## 2018-07-12 ENCOUNTER — Other Ambulatory Visit: Payer: Self-pay

## 2018-07-12 ENCOUNTER — Ambulatory Visit (INDEPENDENT_AMBULATORY_CARE_PROVIDER_SITE_OTHER): Payer: Medicaid Other

## 2018-07-12 ENCOUNTER — Ambulatory Visit (INDEPENDENT_AMBULATORY_CARE_PROVIDER_SITE_OTHER): Payer: Medicaid Other | Admitting: Maternal Newborn

## 2018-07-12 ENCOUNTER — Encounter: Payer: Self-pay | Admitting: Maternal Newborn

## 2018-07-12 VITALS — BP 110/70 | Wt 309.0 lb

## 2018-07-12 DIAGNOSIS — Z6841 Body Mass Index (BMI) 40.0 and over, adult: Secondary | ICD-10-CM

## 2018-07-12 DIAGNOSIS — R102 Pelvic and perineal pain: Secondary | ICD-10-CM | POA: Diagnosis not present

## 2018-07-12 DIAGNOSIS — Z369 Encounter for antenatal screening, unspecified: Secondary | ICD-10-CM

## 2018-07-12 DIAGNOSIS — O0990 Supervision of high risk pregnancy, unspecified, unspecified trimester: Secondary | ICD-10-CM

## 2018-07-12 DIAGNOSIS — O99213 Obesity complicating pregnancy, third trimester: Secondary | ICD-10-CM

## 2018-07-12 DIAGNOSIS — O26893 Other specified pregnancy related conditions, third trimester: Secondary | ICD-10-CM | POA: Diagnosis not present

## 2018-07-12 DIAGNOSIS — Z3689 Encounter for other specified antenatal screening: Secondary | ICD-10-CM | POA: Diagnosis not present

## 2018-07-12 DIAGNOSIS — Z3A38 38 weeks gestation of pregnancy: Secondary | ICD-10-CM | POA: Diagnosis not present

## 2018-07-12 LAB — POCT URINALYSIS DIPSTICK OB: Glucose, UA: NEGATIVE

## 2018-07-12 LAB — FETAL NONSTRESS TEST

## 2018-07-12 NOTE — Patient Instructions (Signed)

## 2018-07-12 NOTE — Progress Notes (Signed)
Routine Prenatal Care Visit  Subjective  Alexis Gutierrez is a 28 y.o. G2P1001 at 6746w4d being seen today for ongoing prenatal care.  She is currently monitored for the following issues for this high-risk pregnancy and has Supervision of high risk pregnancy, antepartum; Obesity in pregnancy; Morbid obesity with BMI of 50.0-59.9, adult (HCC); Chlamydia infection affecting pregnancy in first trimester; Ovarian cyst affecting pregnancy in first trimester, antepartum; History of learning disability; and Pelvic pain affecting pregnancy on their problem list.  ----------------------------------------------------------------------------------- Patient reports no complaints.   Contractions: Irritability. Vag. Bleeding: None.  Movement: Present. No leaking of fluid.  ----------------------------------------------------------------------------------- The following portions of the patient's history were reviewed and updated as appropriate: allergies, current medications, past family history, past medical history, past social history, past surgical history and problem list. Problem list updated.   Objective  Blood pressure 110/70, weight (!) 309 lb (140.2 kg), last menstrual period 10/19/2017. Pregravid weight 327 lb (148.3 kg) Total Weight Gain -18 lb (-8.165 kg) Urinalysis: Urine dipstick shows negative for glucose, positive for protein (trace). Fetal Status: Fetal Heart Rate (bpm): 150   Movement: Present  Presentation: Vertex  General:  Alert, oriented and cooperative. Patient is in no acute distress.  Skin: Skin is warm and dry. No rash noted.   Cardiovascular: Normal heart rate noted  Respiratory: Normal respiratory effort, no problems with respiration noted  Abdomen: Soft, gravid, appropriate for gestational age. Pain/Pressure: Present     Pelvic:  Cervical exam performed Dilation: Fingertip Effacement (%): 20 Station: -3  Extremities: Normal range of motion.  Edema: Trace  Mental Status:  Normal mood and affect. Normal behavior. Normal judgment and thought content.   NST: Baseline: 150 bpm Variability: moderate Accelerations: present Decelerations: absent Tocometry: not done The patient was monitored for 20 minutes, fetal heart rate tracing was deemed reactive.  Assessment   28 y.o. G2P1001 at 1246w4d, EDD 07/22/2018 by Ultrasound presenting for a routine prenatal visit.  Plan   Pregnancy #2 Problems (from 10/19/17 to present)    Problem Noted Resolved   Pelvic pain affecting pregnancy 06/28/2018 by Conard NovakJackson, Stephen D, MD No   Supervision of high risk pregnancy, antepartum 12/27/2017 by Farrel ConnersGutierrez, Colleen, CNM No   Overview Addendum 07/05/2018  9:08 AM by Nadara MustardHarris, Robert P, MD    [facility]  Dating 8wk5d ultrasound Blood type: --/--/A POS Performed at [facility], [address] [place], [place] [address]  [phone](12/07 1943)   Genetic Screen  NIPS: Normal XY Antibody:Negative (10/10 1003)  Anatomic US Complete at 23 weeks  Rubella: 3.70 (10/10 1003) Varicella: Immune  GTT Early:  91             Third trimester: 127 RPR: Non Reactive (02/20 0954)   Rhogam Not needed HBsAg: Negative (10/10 1003)   TDaP vaccine 05/19/2018                       Flu Shot:12/15/17 HIV: Non Reactive (02/20 0954)   Baby Food Breast- had some difficulty breast feeding her daughter once she went home from hospital                                 GBS: POS  Contraception  unsure- considering IUD Pap: NIL 2019  Anes consult 01/2018- approved; recheck 06/2018 Chlamydia positive 12/15/17 negative 01/21/18 TOC  CS/VBAC N.A   Support Person  Obesity in pregnancy 12/27/2017 by Farrel Conners, CNM No   Overview Addendum 05/29/2018  5:01 PM by Conard Novak, MD    BMI>50 BMI >=40 [x ] early 1h gtt -  [x ] u/s for dating [x ]  (x) anesthesia consult at 24 weeks [ ]  anesthesia c/s at 35-36 weeks [x]  nutritional goals [x]  folic acid 1mg  [x]  bASA (>12 weeks)  [x]  consider nutrition consult [ ]  consider maternal EKG 1st trimester [ ]  Growth u/s 28 [ ] , 32 [x] , 36 weeks [ ]  [ ]  NST/AFI weekly 36+ weeks (36[] , 37[] , 38[] , 39[] , 40[] ) [ ]  IOL by 41 weeks (scheduled, prn [] )      Morbid obesity with BMI of 50.0-59.9, adult (HCC) 12/27/2017 by Farrel Conners, CNM No   Chlamydia infection affecting pregnancy in first trimester 12/27/2017 by Farrel Conners, CNM No   Ovarian cyst affecting pregnancy in first trimester, antepartum 12/27/2017 by Farrel Conners, CNM No   Overview Signed 12/27/2017  8:13 PM by Farrel Conners, CNM    8x 5.7 cm left ovarian cyst on 8 week ultrasound Follow up ultrasound at 12 weeks       NST reactive after vibroacoustic stimulation.  AFI 8.7 cm, cephalic position.  Would like to schedule labor induction at next visit.  Term labor symptoms and general obstetric precautions including but not limited to vaginal bleeding, contractions, leaking of fluid and fetal movement were reviewed.  Please refer to After Visit Summary for other counseling recommendations.   Return in about 1 week (around 07/19/2018) for ROB with NST/AFI.  Marcelyn Bruins, CNM 07/12/2018

## 2018-07-12 NOTE — Progress Notes (Signed)
NST/AFI- no concerns

## 2018-07-21 ENCOUNTER — Other Ambulatory Visit
Admission: RE | Admit: 2018-07-21 | Discharge: 2018-07-21 | Disposition: A | Discharge status: Home / Self Care | Payer: Medicaid Other | Source: Ambulatory Visit | Attending: Obstetrics and Gynecology | Admitting: Obstetrics and Gynecology

## 2018-07-21 ENCOUNTER — Other Ambulatory Visit: Payer: Self-pay

## 2018-07-21 ENCOUNTER — Encounter: Payer: Self-pay | Admitting: Advanced Practice Midwife

## 2018-07-21 ENCOUNTER — Other Ambulatory Visit: Payer: Self-pay | Admitting: Maternal Newborn

## 2018-07-21 ENCOUNTER — Ambulatory Visit (INDEPENDENT_AMBULATORY_CARE_PROVIDER_SITE_OTHER): Payer: Medicaid Other

## 2018-07-21 ENCOUNTER — Ambulatory Visit (INDEPENDENT_AMBULATORY_CARE_PROVIDER_SITE_OTHER): Payer: Medicaid Other | Admitting: Advanced Practice Midwife

## 2018-07-21 VITALS — BP 118/78 | Wt 310.0 lb

## 2018-07-21 DIAGNOSIS — O26893 Other specified pregnancy related conditions, third trimester: Secondary | ICD-10-CM | POA: Diagnosis not present

## 2018-07-21 DIAGNOSIS — Z369 Encounter for antenatal screening, unspecified: Secondary | ICD-10-CM

## 2018-07-21 DIAGNOSIS — Z3A39 39 weeks gestation of pregnancy: Secondary | ICD-10-CM | POA: Diagnosis not present

## 2018-07-21 DIAGNOSIS — R102 Pelvic and perineal pain: Secondary | ICD-10-CM | POA: Diagnosis not present

## 2018-07-21 DIAGNOSIS — O0990 Supervision of high risk pregnancy, unspecified, unspecified trimester: Secondary | ICD-10-CM

## 2018-07-21 DIAGNOSIS — Z1159 Encounter for screening for other viral diseases: Secondary | ICD-10-CM | POA: Insufficient documentation

## 2018-07-21 DIAGNOSIS — O99213 Obesity complicating pregnancy, third trimester: Secondary | ICD-10-CM

## 2018-07-21 DIAGNOSIS — Z3689 Encounter for other specified antenatal screening: Secondary | ICD-10-CM | POA: Diagnosis not present

## 2018-07-21 LAB — FETAL NONSTRESS TEST

## 2018-07-21 NOTE — Progress Notes (Signed)
No vb. No lof. NST /AFI today.

## 2018-07-21 NOTE — Progress Notes (Signed)
Routine Prenatal Care Visit  Subjective  Alexis Gutierrez is a 28 y.o. G2P1001 at 2771w6d being seen today for ongoing prenatal care.  She is currently monitored for the following issues for this high-risk pregnancy and has Supervision of high risk pregnancy, antepartum; Obesity in pregnancy; Morbid obesity with BMI of 50.0-59.9, adult (HCC); Chlamydia infection affecting pregnancy in first trimester; Ovarian cyst affecting pregnancy in first trimester, antepartum; History of learning disability; and Pelvic pain affecting pregnancy on their problem list.  ----------------------------------------------------------------------------------- Patient reports no complaints.   Contractions: Irregular. Vag. Bleeding: None.  Movement: Present. Denies leaking of fluid.  ----------------------------------------------------------------------------------- The following portions of the patient's history were reviewed and updated as appropriate: allergies, current medications, past family history, past medical history, past social history, past surgical history and problem list. Problem list updated.   Objective  Blood pressure 118/78, weight (!) 310 lb (140.6 kg), last menstrual period 10/19/2017. Pregravid weight 327 lb (148.3 kg) Total Weight Gain -17 lb (-7.711 kg) Urinalysis: Urine Protein    Urine Glucose    Fetal Status: Fetal Heart Rate (bpm): 150   Movement: Present  Presentation: Vertex  NST reactive 20 minute tracing, 150 bpm baseline, moderate variability, +accelerations, -decelerations  General:  Alert, oriented and cooperative. Patient is in no acute distress.  Skin: Skin is warm and dry. No rash noted.   Cardiovascular: Normal heart rate noted  Respiratory: Normal respiratory effort, no problems with respiration noted  Abdomen: Soft, gravid, appropriate for gestational age. Pain/Pressure: Present     Pelvic:  Cervical exam performed Dilation: 1 Effacement (%): 40 Station: -2  Extremities:  Normal range of motion.     Mental Status: Normal mood and affect. Normal behavior. Normal judgment and thought content.   Assessment   28 y.o. G2P1001 at 10371w6d by  07/22/2018, by Ultrasound presenting for routine prenatal visit  Plan   Pregnancy #2 Problems (from 10/19/17 to present)    Problem Noted Resolved   Pelvic pain affecting pregnancy 06/28/2018 by Conard NovakJackson, Stephen D, MD No   Supervision of high risk pregnancy, antepartum 12/27/2017 by Farrel ConnersGutierrez, Colleen, CNM No   Overview Addendum 07/05/2018  9:08 AM by Nadara MustardHarris, Robert P, MD    [facility]renatal [facility]  Dating 8wk5d ultrasound Blood type: --/--/A POS Performed at [facility], [address] [place], [place] [address]  [phone](12/07 1943)   Genetic Screen  NIPS: Normal XY Antibody:Negative (10/10 1003)  Anatomic US Complete at 23 weeks  Rubella: 3.70 (10/10 1003) Varicella: Immune  GTT Early:  91             Third trimester: 127 RPR: Non Reactive (02/20 0954)   Rhogam Not needed HBsAg: Negative (10/10 1003)   TDaP vaccine 05/19/2018                       Flu Shot:12/15/17 HIV: Non Reactive (02/20 0954)   Baby Food Breast- had some difficulty breast feeding her daughter once she went home from hospital                                 GBS: POS  Contraception  unsure- considering IUD Pap: NIL 2019  Anes consult 01/2018- approved; recheck 06/2018 Chlamydia positive 12/15/17 negative 01/21/18 TOC  CS/VBAC N.A   Support Person           Obesity in pregnancy 12/27/2017 by Farrel ConnersGutierrez, Colleen, CNM No  Overview Addendum 05/29/2018  5:01 PM by Conard Novak, MD    BMI>50 BMI >=40 [x ] early 1h gtt -  [x ] u/s for dating [x ]  (x) anesthesia consult at 24 weeks [ ]  anesthesia c/s at 35-36 weeks [x]  nutritional goals [x]  folic acid 1mg  [x]  bASA (>12 weeks) [x]  consider nutrition consult [ ]  consider maternal EKG 1st trimester [ ]  Growth u/s 28 [ ] , 32 [x] , 36 weeks [ ]  [ ]  NST/AFI weekly 36+ weeks (36[] , 37[] ,  38[] , 39[] , 40[] ) [ ]  IOL by 41 weeks (scheduled, prn [] )      Morbid obesity with BMI of 50.0-59.9, adult (HCC) 12/27/2017 by Farrel Conners, CNM No   Chlamydia infection affecting pregnancy in first trimester 12/27/2017 by Farrel Conners, CNM No   Ovarian cyst affecting pregnancy in first trimester, antepartum 12/27/2017 by Farrel Conners, CNM No   Overview Signed 12/27/2017  8:13 PM by Farrel Conners, CNM    8x 5.7 cm left ovarian cyst on 8 week ultrasound Follow up ultrasound at 12 weeks          Term labor symptoms and general obstetric precautions including but not limited to vaginal bleeding, contractions, leaking of fluid and fetal movement were reviewed in detail with the patient. Please refer to After Visit Summary for other counseling recommendations.   Return for IOL on Monday.  Tresea Mall, CNM 07/21/2018 9:57 AM

## 2018-07-22 LAB — NOVEL CORONAVIRUS, NAA (HOSP ORDER, SEND-OUT TO REF LAB; TAT 18-24 HRS): SARS-CoV-2, NAA: NOT DETECTED

## 2018-07-24 ENCOUNTER — Inpatient Hospital Stay
Admission: RE | Admit: 2018-07-24 | Discharge: 2018-07-27 | DRG: 807 | Disposition: A | Discharge status: Home / Self Care | Payer: Medicaid Other | Attending: Maternal Newborn | Admitting: Maternal Newborn

## 2018-07-24 ENCOUNTER — Other Ambulatory Visit: Payer: Self-pay

## 2018-07-24 DIAGNOSIS — O99210 Obesity complicating pregnancy, unspecified trimester: Secondary | ICD-10-CM

## 2018-07-24 DIAGNOSIS — O480 Post-term pregnancy: Secondary | ICD-10-CM | POA: Diagnosis not present

## 2018-07-24 DIAGNOSIS — Z3A40 40 weeks gestation of pregnancy: Secondary | ICD-10-CM

## 2018-07-24 DIAGNOSIS — O99214 Obesity complicating childbirth: Secondary | ICD-10-CM | POA: Diagnosis present

## 2018-07-24 DIAGNOSIS — Z87891 Personal history of nicotine dependence: Secondary | ICD-10-CM | POA: Diagnosis not present

## 2018-07-24 DIAGNOSIS — R102 Pelvic and perineal pain: Secondary | ICD-10-CM

## 2018-07-24 DIAGNOSIS — N83209 Unspecified ovarian cyst, unspecified side: Secondary | ICD-10-CM

## 2018-07-24 DIAGNOSIS — O26893 Other specified pregnancy related conditions, third trimester: Secondary | ICD-10-CM

## 2018-07-24 DIAGNOSIS — O3481 Maternal care for other abnormalities of pelvic organs, first trimester: Secondary | ICD-10-CM

## 2018-07-24 DIAGNOSIS — Z7901 Long term (current) use of anticoagulants: Secondary | ICD-10-CM

## 2018-07-24 DIAGNOSIS — O0990 Supervision of high risk pregnancy, unspecified, unspecified trimester: Secondary | ICD-10-CM

## 2018-07-24 DIAGNOSIS — Z3490 Encounter for supervision of normal pregnancy, unspecified, unspecified trimester: Secondary | ICD-10-CM | POA: Diagnosis present

## 2018-07-24 DIAGNOSIS — O99824 Streptococcus B carrier state complicating childbirth: Secondary | ICD-10-CM | POA: Diagnosis present

## 2018-07-24 DIAGNOSIS — Z880 Allergy status to penicillin: Secondary | ICD-10-CM

## 2018-07-24 DIAGNOSIS — A749 Chlamydial infection, unspecified: Secondary | ICD-10-CM

## 2018-07-24 HISTORY — DX: Anxiety disorder, unspecified: F41.9

## 2018-07-24 HISTORY — DX: Depression, unspecified: F32.A

## 2018-07-24 LAB — CBC
HCT: 36.9 % (ref 36.0–46.0)
Hemoglobin: 11.9 g/dL — ABNORMAL LOW (ref 12.0–15.0)
MCH: 26.5 pg (ref 26.0–34.0)
MCHC: 32.2 g/dL (ref 30.0–36.0)
MCV: 82.2 fL (ref 80.0–100.0)
Platelets: 203 10*3/uL (ref 150–400)
RBC: 4.49 MIL/uL (ref 3.87–5.11)
RDW: 15.6 % — ABNORMAL HIGH (ref 11.5–15.5)
WBC: 8.7 10*3/uL (ref 4.0–10.5)
nRBC: 0 % (ref 0.0–0.2)

## 2018-07-24 LAB — TYPE AND SCREEN
ABO/RH(D): A POS
Antibody Screen: NEGATIVE

## 2018-07-24 MED ORDER — OXYTOCIN 40 UNITS IN NORMAL SALINE INFUSION - SIMPLE MED
1.0000 m[IU]/min | INTRAVENOUS | Status: DC
  Filled 2018-07-24: qty 1000

## 2018-07-24 MED ORDER — MISOPROSTOL 25 MCG QUARTER TABLET
25.0000 ug | ORAL_TABLET | ORAL | Status: DC | PRN
  Administered 2018-07-24 (×4): 25 ug via VAGINAL
  Filled 2018-07-24 (×4): qty 1

## 2018-07-24 MED ORDER — LACTATED RINGERS IV SOLN
INTRAVENOUS | Status: DC
  Administered 2018-07-24 – 2018-07-25 (×4): via INTRAVENOUS

## 2018-07-24 MED ORDER — TERBUTALINE SULFATE 1 MG/ML IJ SOLN: 0.2500 mg | Freq: Once | INTRAMUSCULAR | Status: DC | PRN

## 2018-07-24 MED ORDER — FENTANYL CITRATE (PF) 100 MCG/2ML IJ SOLN
50.0000 ug | INTRAMUSCULAR | Status: DC | PRN
  Administered 2018-07-24 – 2018-07-25 (×2): 50 ug via INTRAVENOUS
  Filled 2018-07-24 (×2): qty 2

## 2018-07-24 MED ORDER — OXYTOCIN 40 UNITS IN NORMAL SALINE INFUSION - SIMPLE MED
2.5000 [IU]/h | INTRAVENOUS | Status: DC
  Administered 2018-07-25: 2.5 [IU]/h via INTRAVENOUS

## 2018-07-24 MED ORDER — ONDANSETRON HCL 4 MG/2ML IJ SOLN: 4.0000 mg | Freq: Four times a day (QID) | INTRAMUSCULAR | Status: DC | PRN

## 2018-07-24 MED ORDER — LACTATED RINGERS IV SOLN: 500.0000 mL | INTRAVENOUS | Status: DC | PRN

## 2018-07-24 MED ORDER — OXYTOCIN BOLUS FROM INFUSION
500.0000 mL | Freq: Once | INTRAVENOUS | Status: AC
  Administered 2018-07-25: 500 mL via INTRAVENOUS

## 2018-07-24 MED ORDER — LIDOCAINE HCL (PF) 1 % IJ SOLN: 30.0000 mL | INTRAMUSCULAR | Status: DC | PRN

## 2018-07-24 MED ORDER — VANCOMYCIN HCL IN DEXTROSE 1-5 GM/200ML-% IV SOLN
1000.0000 mg | Freq: Two times a day (BID) | INTRAVENOUS | Status: DC
  Administered 2018-07-24 – 2018-07-25 (×3): 1000 mg via INTRAVENOUS
  Filled 2018-07-24 (×4): qty 200

## 2018-07-24 MED ORDER — ACETAMINOPHEN 325 MG PO TABS: 650.0000 mg | ORAL_TABLET | ORAL | Status: DC | PRN

## 2018-07-24 NOTE — H&P (Signed)
Obstetrics Admission History & Physical   Encounter for planned induction of labor  HPI:  28 y.o. G2P1001 @ 284w2d (07/22/2018, by Ultrasound). Admitted on 07/24/2018:   Patient Active Problem List   Diagnosis Date Noted  . Encounter for planned induction of labor 07/24/2018  . Pelvic pain affecting pregnancy 06/28/2018  . Supervision of high risk pregnancy, antepartum 12/27/2017  . Obesity in pregnancy 12/27/2017  . Morbid obesity with BMI of 50.0-59.9, adult (HCC) 12/27/2017  . Ovarian cyst affecting pregnancy in first trimester, antepartum 12/27/2017  . History of learning disability 07/09/2015     Presents for induction of labor at term. She is not having any vaginal bleeding or loss of fluid. Her baby is moving well.  [facility]. Pregnancy complicated by Group B strep, obesity with BMI > 60.  ROS: A review of systems was performed and negative, except as stated in the above HPI. PMHx:  Past Medical History:  Diagnosis Date  . Anxiety   . Asthma   . Depression    PSHx:  Past Surgical History:  Procedure Laterality Date  . TONSILLECTOMY     Medications:  Medications Prior to Admission  Medication Sig Dispense Refill Last Dose  . PRENATAL 28-0.8 MG TABS Take by mouth.   07/24/2018 at Unknown time  . promethazine (PHENERGAN) 25 MG tablet Take 1 tablet (25 mg total) by mouth every 6 (six) hours as needed for nausea or vomiting. 30 tablet 2 Past Month at Unknown time   Allergies: is allergic to amoxicillin; eggs or egg-derived products; penicillins; and tomato. OBHx:  OB History  Gravida Para Term Preterm AB Living  2 1 1     1   SAB TAB Ectopic Multiple Live Births          1    # Outcome Date GA Lbr Len/2nd Weight Sex Delivery Anes PTL Lv  2 Current           1 Term 02/10/11 3229w0d  3374 g F Vag-Spont   LIV   FHx:  Family History  Problem Relation Age of Onset  . Breast cancer Paternal Aunt    Soc Hx: Former smoker, Alcohol: none and Recreational  drug use: none  Objective:   Vitals:   07/24/18 0812 07/24/18 0814  BP:  110/62  Pulse: (!) 106   Resp: 18   Temp: 98.5 F (36.9 C)    Constitutional: Well nourished, well developed female in no acute distress.  HEENT: normal Skin: Warm and dry.  Cardiovascular: Regular rate and rhythm.   Extremity: trace to 1+ bilateral pedal edema Respiratory: Clear to auscultation bilaterally. Normal respiratory effort Abdomen: gravid, non-tender Neuro: Cranial nerves grossly intact Psych: Alert and Oriented x3. No memory deficits. Normal mood and affect.  MS: normal gait, normal bilateral lower extremity ROM/strength/stability.  Pelvic exam: is limited by body habitus External Genitalia, Bartholin's glands, Urethra, Skene's glands: within normal limits Vagina: within normal limits and with no blood in the vault Cervix: 1/40/-3 Uterus: Spontaneous uterine activity  Adnexa: not evaluated  EFM:FHR: 145 bpm, variability: moderate,  accelerations:  Present,  decelerations:  Absent Toco: occasional contractions   Perinatal info:  Blood type: A positive Rubella - Immune Varicella - Immune TDaP Given during third trimester of this pregnancy on 05/19/2018 RPR NR / HIV Neg/ HBsAg Neg   Assessment & Plan:   28 y.o. G2P1001 @ 3084w2d, Admitted on 07/24/2018 for an elective induction of labor at term.    Begin induction  of labor with Cytotec, Antibiotics for GBS prophylaxis, Observe for cervical change, Fetal Wellbeing Reassuring, Epidural when ready and AROM when Appropriate  Marcelyn Bruins, CNM [facility], [facility] 07/24/2018  8:22 AM

## 2018-07-24 NOTE — Anesthesia Preprocedure Evaluation (Addendum)
Anesthesia Evaluation  Patient identified by MRN, date of birth, ID band Patient awake    Reviewed: Allergy & Precautions, H&P , NPO status , [name] Chart, lab work & pertinent test results, reviewed documented beta blocker date and time   Airway Mallampati: II  TM Distance: >3 FB Neck ROM: full    Dental no notable dental hx. (+) Teeth Intact   Pulmonary neg pulmonary ROS, asthma , Current Smoker, former smoker,    Pulmonary exam normal breath sounds clear to auscultation       Cardiovascular Exercise Tolerance: Good negative cardio ROS   Rhythm:regular Rate:Normal     Neuro/Psych PSYCHIATRIC DISORDERS Anxiety Depression negative neurological ROS  negative psych ROS   GI/Hepatic negative GI ROS, Neg liver ROS,   Endo/Other  negative endocrine ROSdiabetes, GestationalMorbid obesity  Renal/GU      Musculoskeletal   Abdominal   Peds  Hematology negative hematology ROS (+)   Anesthesia Other Findings   Reproductive/Obstetrics (+) Pregnancy                            Anesthesia Physical Anesthesia Plan  ASA: III  Anesthesia Plan: Epidural   Post-op Pain Management:    Induction:   PONV Risk Score and Plan:   Airway Management Planned:   Additional Equipment:   Intra-op Plan:   Post-operative Plan:   Informed Consent: I have reviewed the patients History and Physical, chart, labs and discussed the procedure including the risks, benefits and alternatives for the proposed anesthesia with the patient or authorized representative who has indicated his/her understanding and acceptance.       Plan Discussed with:   Anesthesia Plan Comments:         Anesthesia Quick Evaluation

## 2018-07-25 ENCOUNTER — Inpatient Hospital Stay: Payer: Medicaid Other | Admitting: Anesthesiology

## 2018-07-25 DIAGNOSIS — O99824 Streptococcus B carrier state complicating childbirth: Secondary | ICD-10-CM

## 2018-07-25 DIAGNOSIS — O99214 Obesity complicating childbirth: Secondary | ICD-10-CM

## 2018-07-25 LAB — CBC
HCT: 40.1 % (ref 36.0–46.0)
Hemoglobin: 12.7 g/dL (ref 12.0–15.0)
MCH: 26.3 pg (ref 26.0–34.0)
MCHC: 31.7 g/dL (ref 30.0–36.0)
MCV: 83 fL (ref 80.0–100.0)
Platelets: 203 10*3/uL (ref 150–400)
RBC: 4.83 MIL/uL (ref 3.87–5.11)
RDW: 15.6 % — ABNORMAL HIGH (ref 11.5–15.5)
WBC: 13.8 10*3/uL — ABNORMAL HIGH (ref 4.0–10.5)
nRBC: 0 % (ref 0.0–0.2)

## 2018-07-25 LAB — COMPREHENSIVE METABOLIC PANEL
ALT: 16 U/L (ref 0–44)
AST: 18 U/L (ref 15–41)
Albumin: 2.8 g/dL — ABNORMAL LOW (ref 3.5–5.0)
Alkaline Phosphatase: 118 U/L (ref 38–126)
Anion gap: 9 (ref 5–15)
BUN: 7 mg/dL (ref 6–20)
CO2: 21 mmol/L — ABNORMAL LOW (ref 22–32)
Calcium: 9.1 mg/dL (ref 8.9–10.3)
Chloride: 106 mmol/L (ref 98–111)
Creatinine, Ser: 0.74 mg/dL (ref 0.44–1.00)
GFR calc Af Amer: >60 mL/min (ref >60)
GFR calc non Af Amer: >60 mL/min (ref >60)
Glucose, Bld: 100 mg/dL — ABNORMAL HIGH (ref 70–99)
Potassium: 4 mmol/L (ref 3.5–5.1)
Sodium: 136 mmol/L (ref 135–145)
Total Bilirubin: 1 mg/dL (ref 0.3–1.2)
Total Protein: 6.3 g/dL — ABNORMAL LOW (ref 6.5–8.1)

## 2018-07-25 LAB — RPR: RPR Ser Ql: NONREACTIVE

## 2018-07-25 MED ORDER — PRENATAL MULTIVITAMIN CH
1.0000 | ORAL_TABLET | Freq: Every day | ORAL | Status: DC
  Administered 2018-07-26: 10:00:00 1 via ORAL
  Filled 2018-07-25: qty 1

## 2018-07-25 MED ORDER — OXYTOCIN 40 UNITS IN NORMAL SALINE INFUSION - SIMPLE MED
INTRAVENOUS | Status: AC
  Filled 2018-07-25: qty 1000

## 2018-07-25 MED ORDER — FENTANYL 2.5 MCG/ML W/ROPIVACAINE 0.15% IN NS 100 ML EPIDURAL (ARMC)
EPIDURAL | Status: DC | PRN
  Administered 2018-07-25: 12 mL/h via EPIDURAL

## 2018-07-25 MED ORDER — ENOXAPARIN SODIUM 40 MG/0.4ML ~~LOC~~ SOLN
40.0000 mg | Freq: Two times a day (BID) | SUBCUTANEOUS | Status: DC
  Administered 2018-07-26 – 2018-07-27 (×3): 40 mg via SUBCUTANEOUS
  Filled 2018-07-25 (×3): qty 0.4

## 2018-07-25 MED ORDER — SENNOSIDES-DOCUSATE SODIUM 8.6-50 MG PO TABS
2.0000 | ORAL_TABLET | ORAL | Status: DC
  Administered 2018-07-26 – 2018-07-27 (×2): 2 via ORAL
  Filled 2018-07-25 (×2): qty 2

## 2018-07-25 MED ORDER — EPHEDRINE 5 MG/ML INJ: 10.0000 mg | INTRAVENOUS | Status: DC | PRN

## 2018-07-25 MED ORDER — DIPHENHYDRAMINE HCL 25 MG PO CAPS: 25.0000 mg | ORAL_CAPSULE | Freq: Four times a day (QID) | ORAL | Status: DC | PRN

## 2018-07-25 MED ORDER — ONDANSETRON HCL 4 MG/2ML IJ SOLN: 4.0000 mg | INTRAMUSCULAR | Status: DC | PRN

## 2018-07-25 MED ORDER — LIDOCAINE HCL (PF) 1 % IJ SOLN
INTRAMUSCULAR | Status: DC | PRN
  Administered 2018-07-25: 4 mL via INTRADERMAL

## 2018-07-25 MED ORDER — DIBUCAINE (PERIANAL) 1 % EX OINT: 1.0000 "application " | TOPICAL_OINTMENT | CUTANEOUS | Status: DC | PRN

## 2018-07-25 MED ORDER — LIDOCAINE-EPINEPHRINE (PF) 1.5 %-1:200000 IJ SOLN
INTRAMUSCULAR | Status: DC | PRN
  Administered 2018-07-25: 4 mL via PERINEURAL

## 2018-07-25 MED ORDER — DIPHENHYDRAMINE HCL 50 MG/ML IJ SOLN: 12.5000 mg | INTRAMUSCULAR | Status: DC | PRN

## 2018-07-25 MED ORDER — LACTATED RINGERS IV SOLN: 500.0000 mL | Freq: Once | INTRAVENOUS | Status: DC

## 2018-07-25 MED ORDER — BUPIVACAINE HCL (PF) 0.25 % IJ SOLN
INTRAMUSCULAR | Status: DC | PRN
  Administered 2018-07-25: 5 mL via EPIDURAL
  Administered 2018-07-25: 3 mL via EPIDURAL

## 2018-07-25 MED ORDER — BENZOCAINE-MENTHOL 20-0.5 % EX AERO: 1.0000 "application " | INHALATION_SPRAY | CUTANEOUS | Status: DC | PRN

## 2018-07-25 MED ORDER — FENTANYL 2.5 MCG/ML W/ROPIVACAINE 0.15% IN NS 100 ML EPIDURAL (ARMC): 12.0000 mL/h | EPIDURAL | Status: DC

## 2018-07-25 MED ORDER — FENTANYL 2.5 MCG/ML W/ROPIVACAINE 0.15% IN NS 100 ML EPIDURAL (ARMC)
EPIDURAL | Status: AC
  Filled 2018-07-25: qty 100

## 2018-07-25 MED ORDER — SIMETHICONE 80 MG PO CHEW: 80.0000 mg | CHEWABLE_TABLET | ORAL | Status: DC | PRN

## 2018-07-25 MED ORDER — ACETAMINOPHEN 325 MG PO TABS
650.0000 mg | ORAL_TABLET | ORAL | Status: DC | PRN
  Administered 2018-07-25 – 2018-07-26 (×2): 650 mg via ORAL
  Filled 2018-07-25 (×2): qty 2

## 2018-07-25 MED ORDER — ONDANSETRON HCL 4 MG PO TABS: 4.0000 mg | ORAL_TABLET | ORAL | Status: DC | PRN

## 2018-07-25 MED ORDER — PHENYLEPHRINE 40 MCG/ML (10ML) SYRINGE FOR IV PUSH (FOR BLOOD PRESSURE SUPPORT): 80.0000 ug | PREFILLED_SYRINGE | INTRAVENOUS | Status: DC | PRN

## 2018-07-25 MED ORDER — WITCH HAZEL-GLYCERIN EX PADS: 1.0000 "application " | MEDICATED_PAD | CUTANEOUS | Status: DC | PRN

## 2018-07-25 MED ORDER — IBUPROFEN 600 MG PO TABS
600.0000 mg | ORAL_TABLET | Freq: Four times a day (QID) | ORAL | Status: DC
  Administered 2018-07-25 – 2018-07-27 (×8): 600 mg via ORAL
  Filled 2018-07-25 (×8): qty 1

## 2018-07-25 MED ORDER — OXYCODONE-ACETAMINOPHEN 5-325 MG PO TABS
1.0000 | ORAL_TABLET | ORAL | Status: DC | PRN
  Administered 2018-07-25 – 2018-07-26 (×2): 1 via ORAL
  Filled 2018-07-25 (×2): qty 1

## 2018-07-25 MED ORDER — COCONUT OIL OIL: 1.0000 "application " | TOPICAL_OIL | Status: DC | PRN

## 2018-07-25 MED ORDER — MISOPROSTOL 200 MCG PO TABS
800.0000 ug | ORAL_TABLET | Freq: Once | ORAL | Status: AC
  Administered 2018-07-25: 800 ug via RECTAL

## 2018-07-25 MED ORDER — OXYCODONE-ACETAMINOPHEN 5-325 MG PO TABS: 2.0000 | ORAL_TABLET | ORAL | Status: DC | PRN

## 2018-07-25 NOTE — Progress Notes (Signed)
   Subjective:  Comfortable with epidural in place  Objective:   Vitals: Blood pressure (!) 141/66, pulse (!) 170, temperature 98.4 F (36.9 C), temperature source Oral, resp. rate 16, height 5' (1.524 m), weight (!) 140.2 kg, last menstrual period 10/19/2017, SpO2 96 %. General: NAD Abdomen: obese, non-tender Cervical Exam: 6/100/-1  FHT: 145, moderate, no accels, late deceleration present Toco: q57min  Results for orders placed or performed during the hospital encounter of 07/24/18 (from the past 24 hour(s))  CBC     Status: Abnormal   Collection Time: 07/24/18  8:50 AM  Result Value Ref Range   WBC 8.7 4.0 - 10.5 K/uL   RBC 4.49 3.87 - 5.11 MIL/uL   Hemoglobin 11.9 (L) 12.0 - 15.0 g/dL   HCT 27.2 53.6 - 64.4 %   MCV 82.2 80.0 - 100.0 fL   MCH 26.5 26.0 - 34.0 pg   MCHC 32.2 30.0 - 36.0 g/dL   RDW 03.4 (H) 74.2 - 59.5 %   Platelets 203 150 - 400 K/uL   nRBC 0.0 0.0 - 0.2 %  Type and screen [facility]     Status: None   Collection Time: 07/24/18  8:50 AM  Result Value Ref Range   ABO/RH(D) A POS    Antibody Screen NEG    Sample Expiration      07/27/2018,2359 Performed at [facility], [address] [place], [place] [address]   RPR     Status: None   Collection Time: 07/24/18  8:50 AM  Result Value Ref Range   RPR Ser Ql Non Reactive Non Reactive    Assessment:   28 y.o. G2P1001 [redacted]w[redacted]d elective IOL  Plan:   1) Labor - SROM clear, foley bulb out with good cervical change.  Not on pitocin  2) Fetus - cat II - continue position changes - rapid cervical change - discussed fetal heart rate tracing and possible need of proceeding with cesarean section  Vena Austria, MD, [facility], [facility] 07/25/2018, 7:48 AM

## 2018-07-25 NOTE — Progress Notes (Signed)
Anticoagulation monitoring(Lovenox):  28 yo female ordered Lovenox 40 mg Q24h  Filed Weights   07/24/18 0812  Weight: (!) 309 lb (140.2 kg)   BMI 60.35    Lab Results  Component Value Date   CREATININE 0.74 07/25/2018   CREATININE 0.55 02/11/2018   CREATININE 0.67 10/29/2017   Estimated Creatinine Clearance: 136.6 mL/min (by C-G formula based on SCr of 0.74 mg/dL). Hemoglobin & Hematocrit     Component Value Date/Time   HGB 12.7 07/25/2018 1513   HGB 11.3 04/27/2018 0954   HCT 40.1 07/25/2018 1513   HCT 34.5 04/27/2018 0954     Per Protocol for Patient with estCrcl > 30 ml/min and BMI > 40, will transition to Lovenox 40 mg Q12h.

## 2018-07-25 NOTE — Progress Notes (Signed)
  Labor Progress Note   28 y.o. G2P1001 @ [redacted]w[redacted]d , admitted for  Pregnancy, Labor Management. Induction of labor at term.  Subjective:  Comfortable with epidural.  Objective:  BP 112/63 (BP Location: Left Arm)   Pulse 87   Temp 98.5 F (36.9 C) (Oral)   Resp 18   Ht 5' (1.524 m)   Wt (!) 140.2 kg   LMP 10/19/2017 (Within Days)   SpO2 98%   BMI 60.35 kg/m  Abd: gravid, non-tender Extr: trace to 1+ bilateral pedal edema, SCDs placed SVE: 2/50/-3, Foley balloon placed  EFM: FHR: 140 bpm, variability: moderate,  accelerations:  Present,  decelerations:  Present: occasional late Toco: Frequency: Every 1.5-2.5 minutes, Duration: 40-70 seconds and Intensity: mild Labs: I have reviewed the patient's lab results.   Assessment & Plan:  G2P1001 @ [redacted]w[redacted]d, admitted for  Pregnancy and Labor/Delivery Management  1. Pain management: epidural. 2. FWB: FHT category II.  3. ID: GBS positive, continue vancomycin 4. Labor management: Foley bulb in place, consider Pitocin after contractions space out.  All discussed with patient, see orders.  Marcelyn Bruins, CNM 07/25/2018  4:36 AM

## 2018-07-25 NOTE — Lactation Note (Signed)
This note was copied from a baby's chart. Lactation Consultation Note  Patient Name: Alexis Gutierrez WGNFA'O Date: 07/25/2018 Reason for consult: Initial assessment;Mother's request;1st time breastfeeding   Maternal Data Formula Feeding for Exclusion: Yes Reason for exclusion: Mother's choice to formula and breast feed on admission Has patient been taught Hand Expression?: Yes Does the patient have breastfeeding experience prior to this delivery?: Yes  Feeding Feeding Type: Bottle Fed - Formula(Mom attempted to BF after form. feeding) Nipple Type: Slow - flow  LATCH Score Latch: Repeated attempts needed to sustain latch, nipple held in mouth throughout feeding, stimulation needed to elicit sucking reflex.  Audible Swallowing: None  Type of Nipple: Everted at rest and after stimulation  Comfort (Breast/Nipple): Soft / non-tender  Hold (Positioning): Full assist, staff holds infant at breast  LATCH Score: 5  Interventions    Lactation Tools Discussed/Used     Consult Status Consult Status: Follow-up Date: 07/26/18 Follow-up type: In-patient  MOB expressed to RN that she wants to attempt bf. MOB bf her now 28yo in the hospital after delivery, but baby stopped latching once she was d/c. MOB states she couldn't get baby to latch again so she switched to formula feeding. LC reassured her that, that was a good decision. MOB wants to try breastfeeding patient at next feeding so LC helped MOB walk thru the motions and worked on positioning and latch. LC will f/u with family tomorrow.   [name] 07/25/2018, 4:54 PM

## 2018-07-25 NOTE — Progress Notes (Signed)
   Subjective:  Comfortable  Objective:   Vitals: Blood pressure 113/75, pulse 75, temperature 98.2 F (36.8 C), temperature source Axillary, resp. rate 16, height 5' (1.524 m), weight (!) 140.2 kg, last menstrual period 10/19/2017, SpO2 100 %. General: NAD Abdomen: obese, non-tender Cervical Exam:  Dilation: Lip/rim Effacement (%): 100 Station: 0, Plus 1 Exam by:: Venesa Semidey MD  FHT: 145, moderate, no accels (positive fetal scalp stim), earlies and variable with contractions Toco: q85min  No results found for this or any previous visit (from the past 24 hour(s)).  Assessment:   27 y.o. G2P1001 [redacted]w[redacted]d elective IOL  Plan:   1) Labor - expectant management  2) Fetus - cat II tracing  Vena Austria, MD, [facility], [facility] 07/25/2018, 9:21 AM

## 2018-07-25 NOTE — Anesthesia Procedure Notes (Signed)
Epidural Patient location during procedure: OB  Staffing Performed: anesthesiologist   Preanesthetic Checklist Completed: patient identified, site marked, surgical consent, pre-op evaluation, timeout performed, IV checked, risks and benefits discussed and monitors and equipment checked  Epidural Patient position: sitting Prep: Betadine Patient monitoring: heart rate, continuous pulse ox and blood pressure Approach: midline Location: L4-L5 Injection technique: LOR saline  Needle:  Needle type: Tuohy  Needle gauge: 17 G Needle length: 9 cm and 9 Needle insertion depth: 5 and 10 cm Catheter type: closed end flexible Catheter size: 19 Gauge Catheter at skin depth: 17 cm Test dose: negative and 1.5% lidocaine with Epi 1:200 K  Assessment Sensory level: T10 Events: blood not aspirated, injection not painful, no injection resistance, negative IV test and no paresthesia  Additional Notes Difficult procedure secondary to pt body habitus.  Initial pass at L3 4 without success with repeat at L45.  One pass yield loss resist to saline.    Patient tolerated the insertion well without complications.-SATD -IVTD. No paresthesia. Refer to [facility] nursing for VS and dosingReason for block:procedure for pain

## 2018-07-25 NOTE — Discharge Summary (Signed)
Obstetric Discharge Summary Reason for Admission: induction of labor Prenatal Procedures: none Intrapartum Procedures: spontaneous vaginal delivery Postpartum Procedures: none Complications-Operative and Postpartum: none Hemoglobin  Date Value Ref Range Status  07/26/2018 11.6 (L) 12.0 - 15.0 g/dL Final  09/62/8366 29.4 11.1 - 15.9 g/dL Final   HCT  Date Value Ref Range Status  07/26/2018 36.4 36.0 - 46.0 % Final   Hematocrit  Date Value Ref Range Status  04/27/2018 34.5 34.0 - 46.6 % Final    Physical Exam:  General: alert, cooperative, no distress and morbidly obese Lochia: appropriate Uterine Fundus: firm Incision: n/a  DVT Evaluation: No evidence of DVT seen on physical exam. No cords or calf tenderness. No significant calf/ankle edema.  Discharge Diagnoses: Term Pregnancy-delivered  Discharge Information: Date: 07/27/2018 Activity: pelvic rest Diet: routine Allergies as of 07/27/2018      Reactions   Amoxicillin Shortness Of Breath   Has patient had a PCN reaction causing immediate rash, facial/tongue/throat swelling, SOB or lightheadedness with hypotension: Yes Has patient had a PCN reaction causing severe rash involving mucus membranes or skin necrosis: No Has patient had a PCN reaction that required hospitalization: No Has patient had a PCN reaction occurring within the last 10 years: No If all of the above answers are "NO", then may proceed with Cephalosporin use.   Eggs Or Egg-derived Products Other (See Comments)   Acid reflux   Penicillins Swelling   Has patient had a PCN reaction causing immediate rash, facial/tongue/throat swelling, SOB or lightheadedness with hypotension: Yes Has patient had a PCN reaction causing severe rash involving mucus membranes or skin necrosis: No Has patient had a PCN reaction that required hospitalization: No Has patient had a PCN reaction occurring within the last 10 years: No If all of the above answers are "NO", then may  proceed with Cephalosporin use.   Tomato Other (See Comments)   Acid reflux       Medication List    STOP taking these medications   promethazine 25 MG tablet Commonly known as:  PHENERGAN     TAKE these medications   enoxaparin 40 MG/0.4ML injection Commonly known as:  LOVENOX Inject 0.3 mLs (30 mg total) into the skin every 12 (twelve) hours.   ibuprofen 600 MG tablet Commonly known as:  ADVIL Take 1 tablet (600 mg total) by mouth every 6 (six) hours.   oxyCODONE-acetaminophen 5-325 MG tablet Commonly known as:  PERCOCET/ROXICET Take 1 tablet by mouth every 4 (four) hours as needed (pain scale 4-7).   Prenatal 28-0.8 MG Tabs Take by mouth.            Discharge Care Instructions  (From admission, onward)         Start     Ordered   07/27/18 0000  Discharge wound care:    Comments:  Perform wound care instructions   07/27/18 1218          Condition: stable Discharge to: home Follow-up Information    Vena Austria, MD Follow up in 2 week(s).   Specialty:  Obstetrics and Gynecology Why:  postpartum depression check Contact information: [address] [place] [address] [phone]           Newborn Data: Live born child  Birth Weight:   APGAR: ,   Newborn Delivery   Birth date/time:  07/25/2018 11:45:00 Delivery type:  Vaginal, Spontaneous     Home with mother.  Thomasene Mohair, MD 07/27/2018, 12:19 PM

## 2018-07-26 LAB — PROTEIN / CREATININE RATIO, URINE
Creatinine, Urine: 130 mg/dL
Protein Creatinine Ratio: 0.32 mg/mg{Cre} — ABNORMAL HIGH (ref 0.00–0.15)
Total Protein, Urine: 41 mg/dL

## 2018-07-26 LAB — CBC
HCT: 36.4 % (ref 36.0–46.0)
Hemoglobin: 11.6 g/dL — ABNORMAL LOW (ref 12.0–15.0)
MCH: 26.2 pg (ref 26.0–34.0)
MCHC: 31.9 g/dL (ref 30.0–36.0)
MCV: 82.4 fL (ref 80.0–100.0)
Platelets: 190 10*3/uL (ref 150–400)
RBC: 4.42 MIL/uL (ref 3.87–5.11)
RDW: 15.4 % (ref 11.5–15.5)
WBC: 12.5 10*3/uL — ABNORMAL HIGH (ref 4.0–10.5)
nRBC: 0 % (ref 0.0–0.2)

## 2018-07-26 NOTE — Discharge Instructions (Signed)
Discharge Instructions:   Follow-up Appointment: Schedule Follow-Up Postpartum Visit in 6 weeks   If there are any new medications, they have been ordered and will be available for pickup at the listed pharmacy on your way home from the hospital.   Call office if you have any of the following: headache, visual changes, fever >101.0 F, chills, shortness of breath, breast concerns, excessive vaginal bleeding, incision drainage or problems, leg pain or redness, depression or any other concerns. If you have vaginal discharge with an odor, let your doctor know.   It is normal to bleed for up to 6 weeks. You should not soak through more than 1 pad in 1 hour. If you have a blood clot larger than your fist with continued bleeding, call your doctor.   Activity: Do not lift > 10 lbs for 6 weeks (do not lift anything heavier than your baby). No intercourse, tampons, swimming pools, hot tubs, baths (only showers) for 6 weeks.  No driving for 1-2 weeks. Continue prenatal vitamin, especially if breastfeeding. Increase calories and fluids (water) while breastfeeding.   Your milk will come in, in the next couple of days (right now it is colostrum). You may have a slight fever when your milk comes in, but it should go away on its own.  If it does not, and rises above 101 F please call the doctor. You will also feel achy and your breasts will be firm. They will also start to leak. If you are breastfeeding, continue as you have been and you can pump/express milk for comfort.   If you have too much milk, your breasts can become engorged, which could lead to mastitis. This is an infection of the milk ducts. It can be very painful and you will need to notify your doctor to obtain a prescription for antibiotics. You can also treat it with a shower or hot/cold compress.   For concerns about your baby, please call your pediatrician.  For breastfeeding concerns, the lactation consultant can be reached at [phone].    Postpartum blues (feelings of happy one minute and sad another minute) are normal for the first few weeks but if it gets worse let your doctor know.   Congratulations! We enjoyed caring for you and your new bundle of joy!

## 2018-07-26 NOTE — Progress Notes (Addendum)
Post Partum Day 1 Subjective: Doing well, no complaints.  Tolerating regular diet, pain with PO meds, voiding and ambulating without difficulty.  No CP SOB F/C N/V or leg pain No HA, change of vision, RUQ/epigastric pain  Objective: BP 108/60 (BP Location: Right Arm)   Pulse 77   Temp 98.9 F (37.2 C) (Oral)   Resp 20   Ht 5' (1.524 m)   Wt (!) 140.2 kg   LMP 10/19/2017 (Within Days)   SpO2 98%   Breastfeeding (attempting)   BMI 60.35 kg/m    Physical Exam:  General: NAD CV: RRR Pulm: nl effort, CTABL Lochia: moderate Uterine Fundus: fundus firm and below umbilicus DVT Evaluation: no cords, ttp LEs   Recent Labs    07/25/18 1513 07/26/18 0314  HGB 12.7 11.6*  HCT 40.1 36.4  WBC 13.8* 12.5*  PLT 203 190    Assessment/Plan: 27 y.o. G2P2002 postpartum day # 1  1. Continue routine postpartum care 2. Anticoagulation therapy: continue 40 mg [place] Lovenox Q 12 hours 3. A positive, Rubella Immune, Varicella Immune 4. TDAP given antepartum 5. Formula feeding, also plans to pump 6. Contraception: Mirena IUD 7. Disposition: discharge to home tomorrow   Parke Poisson, CNM [facility]  [facility] 07/26/2018, 9:50 AM

## 2018-07-26 NOTE — Anesthesia Postprocedure Evaluation (Signed)
Anesthesia Post Note  Patient: Alexis Gutierrez  Procedure(s) Performed: AN AD HOC LABOR EPIDURAL  Patient location during evaluation: Mother Baby Anesthesia Type: Epidural Level of consciousness: awake and alert Pain management: pain level controlled Vital Signs Assessment: post-procedure vital signs reviewed and stable Respiratory status: spontaneous breathing, nonlabored ventilation and respiratory function stable Cardiovascular status: stable Postop Assessment: no headache, no backache and epidural receding Anesthetic complications: no     Last Vitals:  Vitals:   07/26/18 0353 07/26/18 0819  BP: (!) 124/57 108/60  Pulse: 81 77  Resp: 18 20  Temp: 36.7 C 37.2 C  SpO2: 100% 98%    Last Pain:  Vitals:   07/26/18 0819  TempSrc: Oral  PainSc:                  Rica Mast

## 2018-07-26 NOTE — Lactation Note (Signed)
This note was copied from a baby's chart. Lactation Consultation Note  Patient Name: Alexis Gutierrez [id number] Date: 07/26/2018 Reason for consult: Follow-up assessment;1st time breastfeeding   Maternal Data Has patient been taught Hand Expression?: Yes  Feeding Feeding Type: Breast Fed Nipple Type: Slow - flow  LATCH Score Latch: Repeated attempts needed to sustain latch, nipple held in mouth throughout feeding, stimulation needed to elicit sucking reflex.  Audible Swallowing: A few with stimulation  Type of Nipple: Everted at rest and after stimulation  Comfort (Breast/Nipple): Soft / non-tender  Hold (Positioning): Assistance needed to correctly position infant at breast and maintain latch.  LATCH Score: 7  Interventions Interventions: Breast feeding basics reviewed;Assisted with latch;Hand express;Adjust position;Support pillows;Position options  Lactation Tools Discussed/Used Tools: Nipple Shields;21F feeding tube / Syringe Nipple shield size: 20   Consult Status Consult Status: Follow-up Date: 07/26/18 Follow-up type: In-patient  LC was called into room by [facility] for help with trying breastfeed. MOB desires to attempt bf baby, but says baby gets too fussy and won't stay latched. LC explained that baby has been used to receiving bottles and wants an immediate flow when at the breast. After speaking with MOB, LC and parent devised feeding plan to implement the use of a 34mm nipple shield and putting formula in the chamber with a curved tip syringe. Plan helped keep baby latched onto the breast with sucks and swallows for approx. . Baby seems full and LC discussed using a SNS with next feeding. LC will f/u with MOB later this afternoon.   [name] 07/26/2018, 1:30 PM

## 2018-07-27 MED ORDER — IBUPROFEN 600 MG PO TABS: 600.0000 mg | ORAL_TABLET | Freq: Four times a day (QID) | ORAL | 0 refills | Status: DC

## 2018-07-27 MED ORDER — ENOXAPARIN SODIUM 40 MG/0.4ML ~~LOC~~ SOLN: 30.0000 mg | Freq: Two times a day (BID) | SUBCUTANEOUS | 0 refills | Status: DC

## 2018-07-27 MED ORDER — OXYCODONE-ACETAMINOPHEN 5-325 MG PO TABS: 1.0000 | ORAL_TABLET | ORAL | 0 refills | Status: DC | PRN

## 2018-07-27 NOTE — Progress Notes (Signed)
Patient discharged home with infant. FOB picked up pt and baby with other children present. Discharge instructions and prescriptions given and reviewed with patient. Patient verbalized understanding. Escorted out by staff.

## 2018-08-09 NOTE — Progress Notes (Signed)
I connected with Royalty J Ortez on 08/10/18 at  2:10 PM EDT by telephone and verified that I am speaking with the correct person using two identifiers.   I discussed the limitations, risks, security and privacy concerns of performing an evaluation and management service by telephone and the availability of in person appointments. I also discussed with the patient that there may be a patient responsible charge related to this service. The patient expressed understanding and agreed to proceed.  The patient was at home I spoke with the patient from my workstation phone The names of people involved in this encounter were: Tomi Likens , and Vena Austria   Obstetrics & Gynecology Office Visit   Chief Complaint:  Chief Complaint  Patient presents with   Follow-up    Postpartum depression/vaginal delivery 5/19    History of Present Illness: The patient is a 28 y.o. female presenting initial evaluation for 2 week postpartum depression screening.  The patient is currently taking nothing for anxiety or depression.  She has had recent situational stressors, child birth and newborn.  She reports symptoms of anhedonia, irritability, decreased appetite, social anxiety, feelings of guilt and feelings of worthlessness.  She denies day time somnolence, risk taking behavior, increased appetite, agorophobia, suicidal ideation, homicidal ideation, auditory hallucinations and visual hallucinations. Symptoms have worsened since last visit.     The patient does have a pre-existing history of depression and anxiety.  She  does not a prior history of suicide attempts.  Previous treatment tied include Zoloft with no improvement noted though per patient so discontinued.   Review of Systems: Review of Systems  Constitutional: Negative.   Psychiatric/Behavioral: Positive for depression. Negative for hallucinations, memory loss, substance abuse and suicidal ideas. The patient is nervous/anxious. The patient  does not have insomnia.      Past Medical History:  Past Medical History:  Diagnosis Date   Anxiety    Asthma    Depression     Past Surgical History:  Past Surgical History:  Procedure Laterality Date   TONSILLECTOMY      Gynecologic History: No LMP recorded.  Obstetric History: [id number]  Family History:  Family History  Problem Relation Age of Onset   Breast cancer Paternal Aunt     Social History:  Social History   Socioeconomic History   Marital status: Single    Spouse name: Not on file   Number of children: Not on file   Years of education: Not on file   Highest education level: Not on file  Occupational History   Not on file  Social Needs   Financial resource strain: Not hard at all   Food insecurity:    Worry: Never true    Inability: Never true   Transportation needs:    Medical: No    Non-medical: No  Tobacco Use   Smoking status: Former Smoker   Smokeless tobacco: Never Used  Substance and Sexual Activity   Alcohol use: No   Drug use: Not Currently   Sexual activity: Yes    Birth control/protection: None  Lifestyle   Physical activity:    Days per week: 0 days    Minutes per session: 0 min   Stress: Not at all  Relationships   Social connections:    Talks on phone: More than three times a week    Gets together: More than three times a week    Attends religious service: Never    Active member of club or  organization: No    Attends meetings of clubs or organizations: Never    Relationship status: Not on file   Intimate partner violence:    Fear of current or ex partner: No    Emotionally abused: No    Physically abused: No    Forced sexual activity: No  Other Topics Concern   Not on file  Social History Narrative   Not on file    Allergies:  Allergies  Allergen Reactions   Amoxicillin Shortness Of Breath    Has patient had a PCN reaction causing immediate rash, facial/tongue/throat swelling, SOB or  lightheadedness with hypotension: Yes Has patient had a PCN reaction causing severe rash involving mucus membranes or skin necrosis: No Has patient had a PCN reaction that required hospitalization: No Has patient had a PCN reaction occurring within the last 10 years: No If all of the above answers are "NO", then may proceed with Cephalosporin use.   Eggs Or Egg-Derived Products Other (See Comments)    Acid reflux   Penicillins Swelling    Has patient had a PCN reaction causing immediate rash, facial/tongue/throat swelling, SOB or lightheadedness with hypotension: Yes Has patient had a PCN reaction causing severe rash involving mucus membranes or skin necrosis: No Has patient had a PCN reaction that required hospitalization: No Has patient had a PCN reaction occurring within the last 10 years: No If all of the above answers are "NO", then may proceed with Cephalosporin use.    Tomato Other (See Comments)    Acid reflux     Medications: Prior to Admission medications   Medication Sig Start Date End Date Taking? Authorizing Provider  enoxaparin (LOVENOX) 40 MG/0.4ML injection Inject 0.3 mLs (30 mg total) into the skin every 12 (twelve) hours. 07/27/18 09/05/18  Conard Novak, MD  ibuprofen (ADVIL) 600 MG tablet Take 1 tablet (600 mg total) by mouth every 6 (six) hours. 07/27/18   Conard Novak, MD  oxyCODONE-acetaminophen (PERCOCET/ROXICET) 5-325 MG tablet Take 1 tablet by mouth every 4 (four) hours as needed (pain scale 4-7). 07/27/18   Conard Novak, MD  PRENATAL 28-0.8 MG TABS Take by mouth. 12/05/17 12/05/18  [provider]    Physical Exam  No physical exam as this was a remote telephone visit to promote social distancing during the current COVID-19 Pandemic   Edinburgh Postnatal Depression Scale - 08/10/18 1427      Edinburgh Postnatal Depression Scale:  In the Past 7 Days   I have been able to laugh and see the funny side of things.  1    I have looked  forward with enjoyment to things.  2    I have blamed myself unnecessarily when things went wrong.  0    I have been anxious or worried for no good reason.  2    I have felt scared or panicky for no good reason.  1    Things have been getting on top of me.  1    I have been so unhappy that I have had difficulty sleeping.  0    I have felt sad or miserable.  3    I have been so unhappy that I have been crying.  3    The thought of harming myself has occurred to me.  0    Edinburgh Postnatal Depression Scale Total  13      No flowsheet data found.  Depression screen [facility] 2/9 01/10/2018  Decreased Interest 0  Down,  Depressed, Hopeless 2  PHQ - 2 Score 2  Altered sleeping 2  Tired, decreased energy 2  Change in appetite 1  Feeling bad or failure about yourself  0  Trouble concentrating 0  Moving slowly or fidgety/restless 0  Suicidal thoughts 0  PHQ-9 Score 7  Difficult doing work/chores Not difficult at all    [facility] screen [facility] 2/[facility] 01/10/2018  Decreased Interest 0  Down, Depressed, Hopeless 2  PHQ - 2 Score 2  Altered sleeping 2  Tired, decreased energy 2  Change in appetite 1  Feeling bad or failure about yourself  0  Trouble concentrating 0  Moving slowly or fidgety/restless 0  Suicidal thoughts 0  PHQ-9 Score 7  Difficult doing work/chores Not difficult at all     Assessment: 28 y.o. [id number] postpartum depression screening  Plan: Problem List Items Addressed This Visit    None    Visit Diagnoses    Depression screening    -  Primary      1) Start lexapro 10mg   2) Thyroid and B12 screen has not been obtained previously  3) Return in about 1 week (around 08/17/2018) for medication follow up (in person).    Vena AustriaAndreas Kaylen Motl, MD, Evern CoreFACOG [facility], [facility] 08/10/2018, 9:59 PM

## 2018-08-10 ENCOUNTER — Ambulatory Visit (INDEPENDENT_AMBULATORY_CARE_PROVIDER_SITE_OTHER): Payer: Medicaid Other | Admitting: Obstetrics and Gynecology

## 2018-08-10 ENCOUNTER — Other Ambulatory Visit: Payer: Self-pay | Admitting: Obstetrics and Gynecology

## 2018-08-10 ENCOUNTER — Other Ambulatory Visit: Payer: Self-pay

## 2018-08-10 DIAGNOSIS — Z1331 Encounter for screening for depression: Secondary | ICD-10-CM | POA: Diagnosis not present

## 2018-08-10 DIAGNOSIS — O99210 Obesity complicating pregnancy, unspecified trimester: Secondary | ICD-10-CM

## 2018-08-10 DIAGNOSIS — O0990 Supervision of high risk pregnancy, unspecified, unspecified trimester: Secondary | ICD-10-CM

## 2018-08-10 DIAGNOSIS — Z3490 Encounter for supervision of normal pregnancy, unspecified, unspecified trimester: Secondary | ICD-10-CM

## 2018-08-10 MED ORDER — ESCITALOPRAM OXALATE 10 MG PO TABS: 10.0000 mg | ORAL_TABLET | Freq: Every day | ORAL | 2 refills | Status: DC

## 2018-08-10 NOTE — Telephone Encounter (Signed)
Please advise 

## 2018-08-22 ENCOUNTER — Telehealth: Payer: Self-pay

## 2018-08-22 ENCOUNTER — Ambulatory Visit: Payer: Medicaid Other | Admitting: Obstetrics and Gynecology

## 2018-08-22 NOTE — Telephone Encounter (Signed)
Advise

## 2018-08-22 NOTE — Telephone Encounter (Signed)
Pt calling; has appt today which she is not coming to d/t allergies; no coronavirus sxs.  Please call.  [phone]  Pt called to cancel appt and was told to lm and let AMS know her sxs to see if he thought she should go get tested for coronavirus.  Pt's only sx is sneezing a lot.  She has been dx'd c seasonal allergies and every time the season changes she gets sick.  She is adamant she does not have coronavirus.  Adv I will let AMS know.

## 2018-10-02 ENCOUNTER — Ambulatory Visit (INDEPENDENT_AMBULATORY_CARE_PROVIDER_SITE_OTHER): Payer: Medicaid Other | Admitting: Obstetrics and Gynecology

## 2018-10-02 ENCOUNTER — Other Ambulatory Visit: Payer: Self-pay

## 2018-10-02 ENCOUNTER — Encounter: Payer: Self-pay | Admitting: Obstetrics and Gynecology

## 2018-10-02 DIAGNOSIS — O99345 Other mental disorders complicating the puerperium: Secondary | ICD-10-CM

## 2018-10-02 DIAGNOSIS — Z30430 Encounter for insertion of intrauterine contraceptive device: Secondary | ICD-10-CM | POA: Diagnosis not present

## 2018-10-02 DIAGNOSIS — Z1389 Encounter for screening for other disorder: Secondary | ICD-10-CM

## 2018-10-02 DIAGNOSIS — F53 Postpartum depression: Secondary | ICD-10-CM

## 2018-10-02 MED ORDER — ESCITALOPRAM OXALATE 20 MG PO TABS: 20.0000 mg | ORAL_TABLET | Freq: Every day | ORAL | 2 refills | Status: DC

## 2018-10-02 NOTE — Progress Notes (Signed)
Postpartum Visit  Chief Complaint:  Chief Complaint  Patient presents with  . Postpartum Care    Vaginal delivery 5/19    History of Present Illness: Patient is a 28 y.o. [id number] presents for postpartum visit.   Date of delivery: 07/25/2018 Type of delivery: Vaginal delivery - Vacuum or forceps assisted  no Episiotomy No.  Laceration: no  Pregnancy or labor problems:  no Any problems since the delivery:  no  Newborn Details:  SINGLETON :  1. BabyGender female. Birth weight:   Maternal Details:  Breast or formula feeding: plans to breastfeed Intercourse: No  Contraception after delivery: No  Any bowel or bladder issues: No  Post partum depression/anxiety noted:  yes Edinburgh Post-Partum Depression Score:13 Date of last PAP: 12/15/2017 no abnormalities   Review of Systems: ROS  The following portions of the patient's history were reviewed and updated as appropriate: allergies, current medications, past family history, past medical history, past social history, past surgical history and problem list.  Past Medical History:  Past Medical History:  Diagnosis Date  . Anxiety   . Asthma   . Depression     Past Surgical History:  Past Surgical History:  Procedure Laterality Date  . TONSILLECTOMY      Family History:  Family History  Problem Relation Age of Onset  . Breast cancer Paternal Aunt     Social History:  Social History   Socioeconomic History  . Marital status: Single    Spouse name: Not on file  . Number of children: Not on file  . Years of education: Not on file  . Highest education level: Not on file  Occupational History  . Not on file  Social Needs  . Financial resource strain: Not hard at all  . Food insecurity    Worry: Never true    Inability: Never true  . Transportation needs    Medical: No    Non-medical: No  Tobacco Use  . Smoking status: Former Research scientist (life sciences)  . Smokeless tobacco: Never Used  Substance and Sexual Activity  .  Alcohol use: No  . Drug use: Not Currently  . Sexual activity: Yes    Birth control/protection: None  Lifestyle  . Physical activity    Days per week: 0 days    Minutes per session: 0 min  . Stress: Not at all  Relationships  . Social connections    Talks on phone: More than three times a week    Gets together: More than three times a week    Attends religious service: Never    Active member of club or organization: No    Attends meetings of clubs or organizations: Never    Relationship status: Not on file  . Intimate partner violence    Fear of current or ex partner: No    Emotionally abused: No    Physically abused: No    Forced sexual activity: No  Other Topics Concern  . Not on file  Social History Narrative  . Not on file    Allergies:  Allergies  Allergen Reactions  . Amoxicillin Shortness Of Breath    Has patient had a PCN reaction causing immediate rash, facial/tongue/throat swelling, SOB or lightheadedness with hypotension: Yes Has patient had a PCN reaction causing severe rash involving mucus membranes or skin necrosis: No Has patient had a PCN reaction that required hospitalization: No Has patient had a PCN reaction occurring within the last 10 years: No If all of the  above answers are "NO", then may proceed with Cephalosporin use.  . Eggs Or Egg-Derived Products Other (See Comments)    Acid reflux  . Penicillins Swelling    Has patient had a PCN reaction causing immediate rash, facial/tongue/throat swelling, SOB or lightheadedness with hypotension: Yes Has patient had a PCN reaction causing severe rash involving mucus membranes or skin necrosis: No Has patient had a PCN reaction that required hospitalization: No Has patient had a PCN reaction occurring within the last 10 years: No If all of the above answers are "NO", then may proceed with Cephalosporin use.   . Tomato Other (See Comments)    Acid reflux     Medications: Prior to Admission medications    Medication Sig Start Date End Date Taking? Authorizing Provider  enoxaparin (LOVENOX) 40 MG/0.4ML injection Inject 0.3 mLs (30 mg total) into the skin every 12 (twelve) hours. 07/27/18 09/05/18  Conard NovakJackson, Stephen D, MD  escitalopram (LEXAPRO) 10 MG tablet Take 1 tablet (10 mg total) by mouth daily. 08/10/18 08/10/19  Vena AustriaStaebler, Rickayla Wieland, MD  ibuprofen (ADVIL) 600 MG tablet TAKE ONE TABLET BY MOUTH EVERY 6 HOURS 08/10/18   Vena AustriaStaebler, Moneisha Vosler, MD  oxyCODONE-acetaminophen (PERCOCET/ROXICET) 5-325 MG tablet Take 1 tablet by mouth every 4 (four) hours as needed (pain scale 4-7). 07/27/18   Conard NovakJackson, Stephen D, MD  PRENATAL 28-0.8 MG TABS Take by mouth. 12/05/17 12/05/18  [provider]    Physical Exam Blood pressure 122/62, pulse 86, weight 292 lb (132.5 kg), last menstrual period 09/27/2018, currently breastfeeding.    General: NAD HEENT: normocephalic, anicteric Pulmonary: No increased work of breathing Abdomen: NABS, soft, non-tender, non-distended.  Umbilicus without lesions.  No hepatomegaly, splenomegaly or masses palpable. No evidence of hernia. Genitourinary:  External: Normal external female genitalia.  Normal urethral meatus, normal  Bartholin's and Skene's glands.    Vagina: Normal vaginal mucosa, no evidence of prolapse.    Cervix: Grossly normal in appearance, no bleeding  Uterus: Non-enlarged, mobile, normal contour.  No CMT  Adnexa: ovaries non-enlarged, no adnexal masses  Rectal: deferred Extremities: no edema, erythema, or tenderness Neurologic: Grossly intact Psychiatric: mood appropriate, affect full    GYNECOLOGY OFFICE PROCEDURE NOTE  Alexis Gutierrez is a 28 y.o. [id number] here for a Mirena IUD insertion. No GYN concerns.  Last pap smear was on 12/15/2017 and was normal.  The patient is currently using postpartum state for contraception and her LMP is [name] last menstrual period was 09/27/2018 (exact date)..  The indication for her IUD is contraception/cycle control.   IUD Insertion Procedure Note Patient identified, informed consent performed, consent signed.   Discussed risks of irregular bleeding, cramping, infection, malpositioning, expulsion or uterine perforation of the IUD (1:1000 placements)  which may require further procedure such as laparoscopy.  IUD while effective at preventing pregnancy do not prevent transmission of sexually transmitted diseases and use of barrier methods for this purpose was discussed. Time out was performed.  Urine pregnancy test negative.  Speculum placed in the vagina.  Cervix visualized.  Cleaned with Betadine x 2.  Grasped anteriorly with a single tooth tenaculum.  Uterus sounded to 9 cm. IUD placed per manufacturer's recommendations.  Strings trimmed to 3 cm. Tenaculum was removed, good hemostasis noted.  Patient tolerated procedure well.   Patient was given post-procedure instructions.  She was advised to have backup contraception for one week.  Patient was also asked to check IUD strings periodically and follow up in 6 weeks for IUD check.  Edinburgh Postnatal Depression Scale - 10/02/18 0919      Edinburgh Postnatal Depression Scale:  In the Past 7 Days   I have been able to laugh and see the funny side of things.  0    I have looked forward with enjoyment to things.  0    I have blamed myself unnecessarily when things went wrong.  1    I have been anxious or worried for no good reason.  0    I have felt scared or panicky for no good reason.  2    Things have been getting on top of me.  1    I have been so unhappy that I have had difficulty sleeping.  3    I have felt sad or miserable.  3    I have been so unhappy that I have been crying.  0    The thought of harming myself has occurred to me.  3    Edinburgh Postnatal Depression Scale Total  13       Assessment: 28 y.o. [id number] presenting for 6 week postpartum visit  Plan: Problem List Items Addressed This Visit    None       1) Contraception -  Education given regarding options for contraception, as well as compatibility with breast feeding if applicable.  Patient plans on IUD for contraception.  2)  Pap - ASCCP guidelines and rational discussed.  ASCCP guidelines and rational discussed.  Patient opts for every 3 years screening interval  3) Patient underwent screening for postpartum depression  - increase lexapro to 20mg  - having problems with boyfriend helping out with baby which is a significant stressor.  Her 30 year old is the biggest help currently  4) Return in about 6 weeks (around 11/13/2018) for string check.   Vena AustriaAndreas Cosmo Tetreault, MD, Evern CoreFACOG [facility], [facility] 10/02/2018, 9:30 AM

## 2018-11-14 ENCOUNTER — Ambulatory Visit: Payer: Medicaid Other | Admitting: Obstetrics and Gynecology

## 2018-11-27 ENCOUNTER — Ambulatory Visit: Payer: Medicaid Other | Admitting: Obstetrics and Gynecology

## 2019-01-23 NOTE — Telephone Encounter (Signed)
Pt was tested

## 2019-01-29 NOTE — Telephone Encounter (Signed)
Patient is schedule 02/20/19

## 2019-02-20 ENCOUNTER — Encounter: Payer: Self-pay | Admitting: Obstetrics and Gynecology

## 2019-02-20 ENCOUNTER — Other Ambulatory Visit: Payer: Self-pay

## 2019-02-20 ENCOUNTER — Ambulatory Visit (INDEPENDENT_AMBULATORY_CARE_PROVIDER_SITE_OTHER): Payer: Medicaid Other | Admitting: Obstetrics and Gynecology

## 2019-02-20 VITALS — BP 114/76 | Ht 60.0 in | Wt 307.0 lb

## 2019-02-20 DIAGNOSIS — Z30431 Encounter for routine checking of intrauterine contraceptive device: Secondary | ICD-10-CM

## 2019-02-20 DIAGNOSIS — O99345 Other mental disorders complicating the puerperium: Secondary | ICD-10-CM | POA: Diagnosis not present

## 2019-02-20 DIAGNOSIS — F53 Postpartum depression: Secondary | ICD-10-CM

## 2019-02-20 NOTE — Progress Notes (Signed)
Obstetrics & Gynecology Office Visit   Chief Complaint:  Chief Complaint  Patient presents with  . Follow-up    IUD string check    History of Present Illness: 28 y.o. patient presenting for follow up of Mirena IUD placement 6+ weeks ago.  The indication for her IUD was cycle control and contraception.  She denies any complications since her IUD placement.  Still having some occasional spotting.  is not able to feel strings.    Review of Systems: Review of Systems  Constitutional: Negative.   Gastrointestinal: Negative.   Genitourinary: Negative.   Psychiatric/Behavioral: Negative.     Past Medical History:  Past Medical History:  Diagnosis Date  . Anxiety   . Asthma   . Depression     Past Surgical History:  Past Surgical History:  Procedure Laterality Date  . TONSILLECTOMY      Gynecologic History: No LMP recorded (approximate). (Menstrual status: IUD).  Obstetric History: [id number]  Family History:  Family History  Problem Relation Age of Onset  . Breast cancer Paternal Aunt     Social History:  Social History   Socioeconomic History  . Marital status: Single    Spouse name: Not on file  . Number of children: Not on file  . Years of education: Not on file  . Highest education level: Not on file  Occupational History  . Not on file  Tobacco Use  . Smoking status: Former Games developermoker  . Smokeless tobacco: Never Used  Substance and Sexual Activity  . Alcohol use: No  . Drug use: Not Currently  . Sexual activity: Yes    Birth control/protection: I.U.D.  Other Topics Concern  . Not on file  Social History Narrative   [place] in [employer]   Social Determinants of Health   Financial Resource Strain: Low Risk   . Difficulty of Paying Living Expenses: Not hard at all  Food Insecurity: No Food Insecurity  . Worried About Programme researcher, broadcasting/film/videounning Out of Food in the Last Year: Never true  . Ran Out of Food in the Last Year: Never true  Transportation Needs: No  Transportation Needs  . Lack of Transportation (Medical): No  . Lack of Transportation (Non-Medical): No  Physical Activity: Inactive  . Days of Exercise per Week: 0 days  . Minutes of Exercise per Session: 0 min  Stress: No Stress Concern Present  . Feeling of Stress : Not at all  Social Connections: Unknown  . Frequency of Communication with Friends and Family: More than three times a week  . Frequency of Social Gatherings with Friends and Family: More than three times a week  . Attends Religious Services: Never  . Active Member of Clubs or Organizations: No  . Attends BankerClub or Organization Meetings: Never  . Marital Status: Not on file  Intimate Partner Violence: Not At Risk  . Fear of Current or Ex-Partner: No  . Emotionally Abused: No  . Physically Abused: No  . Sexually Abused: No    Allergies:  Allergies  Allergen Reactions  . Amoxicillin Shortness Of Breath    Has patient had a PCN reaction causing immediate rash, facial/tongue/throat swelling, SOB or lightheadedness with hypotension: Yes Has patient had a PCN reaction causing severe rash involving mucus membranes or skin necrosis: No Has patient had a PCN reaction that required hospitalization: No Has patient had a PCN reaction occurring within the last 10 years: No If all of the above answers are "NO", then may proceed with  Cephalosporin use.  . Eggs Or Egg-Derived Products Other (See Comments)    Acid reflux  . Penicillins Swelling    Has patient had a PCN reaction causing immediate rash, facial/tongue/throat swelling, SOB or lightheadedness with hypotension: Yes Has patient had a PCN reaction causing severe rash involving mucus membranes or skin necrosis: No Has patient had a PCN reaction that required hospitalization: No Has patient had a PCN reaction occurring within the last 10 years: No If all of the above answers are "NO", then may proceed with Cephalosporin use.   . Tomato Other (See Comments)    Acid reflux      Medications: Prior to Admission medications   Medication Sig Start Date End Date Taking? Authorizing Provider  escitalopram (LEXAPRO) 20 MG tablet Take 1 tablet (20 mg total) by mouth daily. 10/02/18 10/02/19 Yes Vena Austria, MD  ibuprofen (ADVIL) 600 MG tablet TAKE ONE TABLET BY MOUTH EVERY 6 HOURS 08/10/18  Yes Vena Austria, MD  levonorgestrel (MIRENA) 20 MCG/24HR IUD 1 each by Intrauterine route once.   Yes [provider]  enoxaparin (LOVENOX) 40 MG/0.4ML injection Inject 0.3 mLs (30 mg total) into the skin every 12 (twelve) hours. 07/27/18 09/05/18  Conard Novak, MD  oxyCODONE-acetaminophen (PERCOCET/ROXICET) 5-325 MG tablet Take 1 tablet by mouth every 4 (four) hours as needed (pain scale 4-7). Patient not taking: Reported on 02/20/2019 07/27/18   Conard Novak, MD    Physical Exam Blood pressure 114/76, height 5' (1.524 m), weight (!) 307 lb (139.3 kg), currently breastfeeding. No LMP recorded (approximate). (Menstrual status: IUD).  General: NAD HEENT: normocephalic, anicteric Pulmonary: No increased work of breathing  Genitourinary:  External: Normal external female genitalia.  Normal urethral meatus, normal  Bartholin's and Skene's glands.    Vagina: Normal vaginal mucosa, no evidence of prolapse.    Cervix: Grossly normal in appearance, no bleeding, IUD strings visualized 2cm  Uterus: Non-enlarged, mobile, normal contour.  No CMT  Adnexa: ovaries non-enlarged, no adnexal masses  Rectal: deferred  Lymphatic: no evidence of inguinal lymphadenopathy Extremities: no edema, erythema, or tenderness Neurologic: Grossly intact Psychiatric: mood appropriate, affect full  Female chaperone present for pelvic and breast  portions of the physical exam  Assessment: 28 y.o. [id number] No problem-specific Assessment & Plan notes found for this encounter.   Plan: Problem List Items Addressed This Visit    None    Visit Diagnoses    IUD check up    -   Primary   Postpartum depression           1.  The patient was given instructions to check her IUD strings monthly and call with any problems or concerns.  She should call for fevers, chills, abnormal vaginal discharge, pelvic pain, or other complaints.  2.   IUDs while effective at preventing pregnancy do not prevent transmission of sexually transmitted diseases and use of barrier methods for this purpose was discussed.  Low overall incidence of failure with 99.7% efficacy rate in typical use.  The patient has not contraindication to IUD placement.  3.  She will return for a annual exam in 1 year.  All questions answered.  4) A total of 15 minutes were spent in face-to-face contact with the patient during this encounter with over half of that time devoted to counseling and coordination of care.  5) doing well with lexapro continue at 20mg  dose  6) Return in about 3 months (around 05/21/2019) for medication follow up.   05/23/2019,  MD, [facility], [facility] 02/20/2019, 9:32 AM

## 2019-04-26 DIAGNOSIS — F339 Major depressive disorder, recurrent, unspecified: Secondary | ICD-10-CM | POA: Insufficient documentation

## 2019-04-26 DIAGNOSIS — F419 Anxiety disorder, unspecified: Secondary | ICD-10-CM | POA: Insufficient documentation

## 2019-04-26 DIAGNOSIS — K219 Gastro-esophageal reflux disease without esophagitis: Secondary | ICD-10-CM | POA: Insufficient documentation

## 2019-04-26 DIAGNOSIS — M51369 Other intervertebral disc degeneration, lumbar region without mention of lumbar back pain or lower extremity pain: Secondary | ICD-10-CM | POA: Insufficient documentation

## 2019-04-26 DIAGNOSIS — M1611 Unilateral primary osteoarthritis, right hip: Secondary | ICD-10-CM | POA: Insufficient documentation

## 2019-05-21 ENCOUNTER — Ambulatory Visit: Payer: Medicaid Other | Admitting: Obstetrics and Gynecology

## 2019-06-05 NOTE — Progress Notes (Signed)
Leim Fabry, MD   Chief Complaint  Patient presents with  . Contraception    IUD removal due to a break out in entire body, might be interested in Nexplanon    HPI:      Ms. KENDYL FESTA is a 29 y.o. [id number] who LMP was [name] last menstrual period was 05/08/2019 (exact date)., presents today for IUD removal due to hives. Thinks it started after Mirena IUD placed 7/20. Had Mirena in past with same sx, but wanted long term BC PP so tried it again. Was on lexapro for PP sx and thought it was related to SSRI. No change with stopping lexapro. Sx occur about once wkly, no known triggers. Gets all over itching and hives, lasting a few hrs. Takes benadryl with eventual relief. Used to be on zyrtec daily in past for seasonal allergies but not currently. Also with some food allergies. Uses unscented products, no dryer sheets. Would like nexplanon. Did it in past without hives/side effects.   Past Medical History:  Diagnosis Date  . Anxiety   . Asthma   . Depression     Past Surgical History:  Procedure Laterality Date  . TONSILLECTOMY      Family History  Problem Relation Age of Onset  . Breast cancer Paternal Aunt        89s    Social History   Socioeconomic History  . Marital status: Married    Spouse name: Not on file  . Number of children: Not on file  . Years of education: Not on file  . Highest education level: Not on file  Occupational History  . Not on file  Tobacco Use  . Smoking status: Former Games developer  . Smokeless tobacco: Never Used  Substance and Sexual Activity  . Alcohol use: No  . Drug use: Not Currently  . Sexual activity: Yes    Birth control/protection: I.U.D.    Comment: Mirena  Other Topics Concern  . Not on file  Social History Narrative   Husband in [employer] 2020   Social Determinants of Health   Financial Resource Strain: Low Risk   . Difficulty of Paying Living Expenses: Not hard at all  Food Insecurity: No Food Insecurity  .  Worried About Programme researcher, broadcasting/film/video in the Last Year: Never true  . Ran Out of Food in the Last Year: Never true  Transportation Needs: No Transportation Needs  . Lack of Transportation (Medical): No  . Lack of Transportation (Non-Medical): No  Physical Activity: Inactive  . Days of Exercise per Week: 0 days  . Minutes of Exercise per Session: 0 min  Stress: No Stress Concern Present  . Feeling of Stress : Not at all  Social Connections: Unknown  . Frequency of Communication with Friends and Family: More than three times a week  . Frequency of Social Gatherings with Friends and Family: More than three times a week  . Attends Religious Services: Never  . Active Member of Clubs or Organizations: No  . Attends Banker Meetings: Never  . Marital Status: Not on file  Intimate Partner Violence: Not At Risk  . Fear of Current or Ex-Partner: No  . Emotionally Abused: No  . Physically Abused: No  . Sexually Abused: No    Outpatient Medications Prior to Visit  Medication Sig Dispense Refill  . nicotine (NICODERM CQ - DOSED IN MG/24 HOURS) 14 mg/24hr patch Place onto the skin.    . nicotine polacrilex (NICORETTE) 4  MG gum Place inside cheek.    . escitalopram (LEXAPRO) 20 MG tablet Take 1 tablet (20 mg total) by mouth daily. 30 tablet 2  . levonorgestrel (MIRENA) 20 MCG/24HR IUD 1 each by Intrauterine route once.    . enoxaparin (LOVENOX) 40 MG/0.4ML injection Inject 0.3 mLs (30 mg total) into the skin every 12 (twelve) hours. 24 mL 0  . ibuprofen (ADVIL) 600 MG tablet TAKE ONE TABLET BY MOUTH EVERY 6 HOURS 30 tablet 0  . oxyCODONE-acetaminophen (PERCOCET/ROXICET) 5-325 MG tablet Take 1 tablet by mouth every 4 (four) hours as needed (pain scale 4-7). (Patient not taking: Reported on 02/20/2019) 30 tablet 0   No facility-administered medications prior to visit.      ROS:  Review of Systems  Constitutional: Negative for fever.  Gastrointestinal: Negative for blood in stool,  constipation, diarrhea, nausea and vomiting.  Genitourinary: Negative for dyspareunia, dysuria, flank pain, frequency, hematuria, urgency, vaginal bleeding, vaginal discharge and vaginal pain.  Musculoskeletal: Negative for back pain.  Skin: Positive for rash.  BREAST: No symptoms   OBJECTIVE:   Vitals:  BP 130/80   Ht 5' (1.524 m)   Wt (!) 311 lb (141.1 kg)   LMP 05/08/2019 (Exact Date)   Breastfeeding No   BMI 60.74 kg/m   Physical Exam Vitals reviewed.  Constitutional:      Appearance: She is well-developed.  Pulmonary:     Effort: Pulmonary effort is normal.  Genitourinary:    General: Normal vulva.     Pubic Area: No rash.      Labia:        Right: No rash, tenderness or lesion.        Left: No rash, tenderness or lesion.      Vagina: Normal. No vaginal discharge, erythema or tenderness.     Cervix: Normal.     Comments: IUD STRINGS IN CX OS Musculoskeletal:        General: Normal range of motion.     Cervical back: Normal range of motion.  Skin:    General: Skin is warm and dry.  Neurological:     General: No focal deficit present.     Mental Status: She is alert and oriented to person, place, and time.     Cranial Nerves: No cranial nerve deficit.  Psychiatric:        Mood and Affect: Mood normal.        Behavior: Behavior normal.        Thought Content: Thought content normal.        Judgment: Judgment normal.    IUD Removal Strings of IUD identified and grasped.  IUD removed without problem with ring forceps.  Pt tolerated this well.  IUD noted to be intact.  Nexplanon Insertion  Patient given informed consent, signed copy in the chart, time out was performed.  Appropriate time out taken.  [name] LEFT arm was prepped and draped in the usual sterile fashion. The ruler used to measure and mark insertion area.  Pt was prepped with betadine swab and then injected with 1.0 cc of 2% lidocaine with epinephrine. Nexplanon removed form packaging,  Device  confirmed in needle, then inserted full length of needle and withdrawn per handbook instructions.  Pt insertion site covered with steri-strip and a bandage.   Minimal blood loss.  Pt tolerated the procedure welL.  Assessment/Plan: Hives--discussed that IUD may not be cause of hives, but since had sx before, may be relieved with removal. If sx persist, keep  journal to identify cause although discussed often times hives don't have an identifiable cause. Zyrtec QD to BID if sx recur. F/u with derm prn.   Encounter for IUD removal  Nexplanon insertion - Plan: etonogestrel (NEXPLANON) implant 68 mg; condoms for 7 days   Meds ordered this encounter  Medications  . etonogestrel (NEXPLANON) implant 68 mg   She was told to remove the dressing in 12-24 hours, to keep the incision area dry for 24 hours and to remove the Steristrip in 2-3  days.  Notify us if any signs of tenderness, redness, pain, or fevers develop.    Return in about 4 months (around 10/02/2019), or if symptoms worsen or fail to improve, for annual.  Eythan Jayne B. Dacoda Spallone, PA-C 06/06/2019 11:57 AM

## 2019-06-06 ENCOUNTER — Ambulatory Visit (INDEPENDENT_AMBULATORY_CARE_PROVIDER_SITE_OTHER): Payer: Medicaid Other | Admitting: Obstetrics and Gynecology

## 2019-06-06 ENCOUNTER — Other Ambulatory Visit: Payer: Self-pay

## 2019-06-06 ENCOUNTER — Other Ambulatory Visit: Payer: Self-pay | Admitting: Obstetrics and Gynecology

## 2019-06-06 ENCOUNTER — Encounter: Payer: Self-pay | Admitting: Obstetrics and Gynecology

## 2019-06-06 VITALS — BP 130/80 | Ht 60.0 in | Wt 311.0 lb

## 2019-06-06 DIAGNOSIS — L509 Urticaria, unspecified: Secondary | ICD-10-CM | POA: Diagnosis not present

## 2019-06-06 DIAGNOSIS — Z30017 Encounter for initial prescription of implantable subdermal contraceptive: Secondary | ICD-10-CM | POA: Diagnosis not present

## 2019-06-06 DIAGNOSIS — Z30432 Encounter for removal of intrauterine contraceptive device: Secondary | ICD-10-CM

## 2019-06-06 DIAGNOSIS — Z3009 Encounter for other general counseling and advice on contraception: Secondary | ICD-10-CM

## 2019-06-06 DIAGNOSIS — N939 Abnormal uterine and vaginal bleeding, unspecified: Secondary | ICD-10-CM

## 2019-06-06 DIAGNOSIS — Z30431 Encounter for routine checking of intrauterine contraceptive device: Secondary | ICD-10-CM

## 2019-06-06 MED ORDER — ETONOGESTREL 68 MG ~~LOC~~ IMPL: 68.0000 mg | DRUG_IMPLANT | Freq: Once | SUBCUTANEOUS | Status: DC

## 2019-06-06 NOTE — Patient Instructions (Addendum)
I value your feedback and entrusting us with your care. If you get a [facility] patient survey, I would appreciate you taking the time to let us know about your experience today. Thank you!  As of February 15, 2019, your lab results will be released to your MyChart immediately, before I even have a chance to see them. Please give me time to review them and contact you if there are any abnormalities. Thank you for your patience.   Remove the dressing in 24 hours,  keep the incision area dry for 24 hours and remove the Steristrip in 2-3  days.  Notify us if any signs of tenderness, redness, pain, or fevers develop.  

## 2019-07-02 ENCOUNTER — Encounter: Payer: Self-pay | Admitting: Obstetrics and Gynecology

## 2019-07-02 ENCOUNTER — Other Ambulatory Visit: Payer: Self-pay

## 2019-07-02 ENCOUNTER — Ambulatory Visit (INDEPENDENT_AMBULATORY_CARE_PROVIDER_SITE_OTHER): Payer: Medicaid Other | Admitting: Obstetrics and Gynecology

## 2019-07-02 VITALS — BP 130/90 | Ht 60.0 in | Wt 309.0 lb

## 2019-07-02 DIAGNOSIS — L509 Urticaria, unspecified: Secondary | ICD-10-CM

## 2019-07-02 DIAGNOSIS — M79602 Pain in left arm: Secondary | ICD-10-CM

## 2019-07-02 NOTE — Patient Instructions (Signed)
I value your feedback and entrusting us with your care. If you get a [facility] patient survey, I would appreciate you taking the time to let us know about your experience today. Thank you!  As of February 15, 2019, your lab results will be released to your MyChart immediately, before I even have a chance to see them. Please give me time to review them and contact you if there are any abnormalities. Thank you for your patience.  

## 2019-07-02 NOTE — Progress Notes (Signed)
Gayland Curry, MD   Chief Complaint  Patient presents with  . Follow-up    Arm in pain    HPI:      [name] is a 29 y.o. [id number] who LMP was No LMP recorded. Patient has had an implant., presents today for LT arm pain after Nexplanon placed 06/06/19. Mirena removed that day due to hives. Feels a sharp pain at insertion site and hurts to lift her arm. This is 2nd nexplanon and placed at previous insertion site. No evidence of infection. No bleeding with nexplanon until some started yesterday. 1st nexplanon removed due to bleeding for 3 months.  Still having hives. Taking claritin/zyrtec without sx change. Hx of seasonal allergies.   Past Medical History:  Diagnosis Date  . Anxiety   . Asthma   . Depression     Past Surgical History:  Procedure Laterality Date  . TONSILLECTOMY      Family History  Problem Relation Age of Onset  . Breast cancer Paternal Aunt        10s    Social History   Socioeconomic History  . Marital status: Married    Spouse name: Not on file  . Number of children: Not on file  . Years of education: Not on file  . Highest education level: Not on file  Occupational History  . Not on file  Tobacco Use  . Smoking status: Former Research scientist (life sciences)  . Smokeless tobacco: Never Used  Substance and Sexual Activity  . Alcohol use: No  . Drug use: Not Currently  . Sexual activity: Yes    Birth control/protection: Implant  Other Topics Concern  . Not on file  Social History Narrative   Husband in [place]   Social Determinants of Health   Financial Resource Strain: Low Risk   . Difficulty of Paying Living Expenses: Not hard at all  Food Insecurity: No Food Insecurity  . Worried About Charity fundraiser in the Last Year: Never true  . Ran Out of Food in the Last Year: Never true  Transportation Needs: No Transportation Needs  . Lack of Transportation (Medical): No  . Lack of Transportation (Non-Medical): No  Physical Activity:  Inactive  . Days of Exercise per Week: 0 days  . Minutes of Exercise per Session: 0 min  Stress: No Stress Concern Present  . Feeling of Stress : Not at all  Social Connections: Unknown  . Frequency of Communication with Friends and Family: More than three times a week  . Frequency of Social Gatherings with Friends and Family: More than three times a week  . Attends Religious Services: Never  . Active Member of Clubs or Organizations: No  . Attends Archivist Meetings: Never  . Marital Status: Not on file  Intimate Partner Violence: Not At Risk  . Fear of Current or Ex-Partner: No  . Emotionally Abused: No  . Physically Abused: No  . Sexually Abused: No    Outpatient Medications Prior to Visit  Medication Sig Dispense Refill  . ergocalciferol (VITAMIN D2) 1.25 MG (50000 UT) capsule Take by mouth.    . nicotine (NICODERM CQ - DOSED IN MG/24 HOURS) 14 mg/24hr patch Place onto the skin.     Facility-Administered Medications Prior to Visit  Medication Dose Route Frequency Provider Last Rate Last Admin  . etonogestrel (NEXPLANON) implant 68 mg  68 mg Subdermal Once Jarmaine Ehrler B, PA-C          ROS:  Review of Systems  Constitutional: Negative for fever.  Gastrointestinal: Negative for blood in stool, constipation, diarrhea, nausea and vomiting.  Genitourinary: Negative for dyspareunia, dysuria, flank pain, frequency, hematuria, urgency, vaginal bleeding, vaginal discharge and vaginal pain.  Musculoskeletal: Negative for back pain.  Skin: Negative for rash.   BREAST: No symptoms   OBJECTIVE:   Vitals:  BP 130/90   Ht 5' (1.524 m)   Wt (!) 309 lb (140.2 kg)   Breastfeeding No   BMI 60.35 kg/m   Physical Exam Vitals reviewed.  Constitutional:      Appearance: She is well-developed.  Pulmonary:     Effort: Pulmonary effort is normal.  Musculoskeletal:        General: Normal range of motion.     Cervical back: Normal range of motion.  Skin:     General: Skin is warm and dry.       Neurological:     General: No focal deficit present.     Mental Status: She is alert and oriented to person, place, and time.     Cranial Nerves: No cranial nerve deficit.  Psychiatric:        Mood and Affect: Mood normal.        Behavior: Behavior normal.        Thought Content: Thought content normal.        Judgment: Judgment normal.     Assessment/Plan: Pain of left upper extremity--at nexplanon insertion site. Neg exam except tender at insertion site. Can give it more time to heal/improve vs removal. Pt will give it more time. Question some scarring due to placement at same site as previous nexplanon. F/u prn.   Hives--f/u with derm. Keep diary. Discussed that identifying cause of hives can be very difficult. Cont zyrtec/claritin. No scented products    Return if symptoms worsen or fail to improve.  Gentri Guardado B. Emilyann Banka, PA-C 07/02/2019 3:40 PM

## 2019-09-25 ENCOUNTER — Other Ambulatory Visit: Payer: Self-pay | Admitting: Obstetrics and Gynecology

## 2019-09-25 MED ORDER — ESCITALOPRAM OXALATE 20 MG PO TABS: 20.0000 mg | ORAL_TABLET | Freq: Every day | ORAL | 0 refills | Status: DC

## 2019-09-25 NOTE — Telephone Encounter (Signed)
Patient is schedule 10/26/19 with AMS

## 2019-09-25 NOTE — Telephone Encounter (Signed)
Needs follow up sometime in the next month for medication follow up phone, video, or in person

## 2019-10-19 ENCOUNTER — Other Ambulatory Visit: Payer: Self-pay | Admitting: Obstetrics and Gynecology

## 2019-10-26 ENCOUNTER — Other Ambulatory Visit: Payer: Self-pay

## 2019-10-26 ENCOUNTER — Ambulatory Visit: Payer: Medicaid Other | Admitting: Obstetrics and Gynecology

## 2019-11-18 NOTE — Progress Notes (Deleted)
   No chief complaint on file.    History of Present Illness:  Alexis Gutierrez is a 29 y.o. that had a nexplanon placed approximately 6 months  ago. Since that time, she  ***.  There were no vitals taken for this visit.   Nexplanon removal Procedure note - The Nexplanon was noted in the patient's arm and the end was identified. The skin was cleansed with a Betadine solution. A small injection of subcutaneous lidocaine with epinephrine was given over the end of the implant. An incision was made at the end of the implant. The rod was noted in the incision and grasped with a hemostat. It was noted to be intact.  Steri-Strip was placed approximating the incision. Hemostasis was noted.  Assessment: Nexplanon removal   Plan:   She was told to remove the dressing in 12-24 hours, to keep the incision area dry for 24 hours and to remove the Steristrip in 2-3  days.  Notify us if any signs of tenderness, redness, pain, or fevers develop.   Britney Captain B. Meygan Kyser, PA-C 11/18/2019 8:18 PM

## 2019-11-19 ENCOUNTER — Ambulatory Visit: Payer: Medicaid Other | Admitting: Obstetrics and Gynecology

## 2019-11-19 NOTE — Progress Notes (Signed)
   Chief Complaint  Patient presents with  . Nexplanon removal    pt has noticed stomache aches since insertion, interested in IUD again    History of Present Illness:  Alexis Gutierrez is a 29 y.o. that had a nexplanon placed approximately 6 months  ago. Since that time, she has had excessive bleeding for 3 months at a time. Also having issues with constipation and pelvic discomfort. Is on low carb diet due to upcoming bariatric surgery. Hasn't tried fiber supplements, but concerned GI sx related to nexplanon since started a couple months afterwards.   Would like another IUD. First mirena removed in past due to pain for partner. 2nd Mirena removed 3/21 due to hives. Pt thought they were caused by mirena but hives persisted a few months after removal.  Pt planning to have bariatric surgery by end of the year.  BP 140/80   Ht 5' (1.524 m)   Wt (!) 310 lb (140.6 kg)   Breastfeeding No   BMI 60.54 kg/m    Nexplanon removal Procedure note - The Nexplanon was noted in the patient's arm and the end was identified. The skin was cleansed with a Betadine solution. A small injection of subcutaneous lidocaine with epinephrine was given over the end of the implant. An incision was made at the end of the implant. The rod was noted in the incision and grasped with a hemostat. It was noted to be intact.  Steri-Strip was placed approximating the incision. Hemostasis was noted.  IUD Insertion Procedure Note Patient identified, informed consent performed, consent signed.   Discussed risks of irregular bleeding, cramping, infection, malpositioning or misplacement of the IUD outside the uterus which may require further procedure such as laparoscopy, risk of failure <1%. Time out was performed.    Speculum placed in the vagina.  Cervix visualized.  Cleaned with Betadine x 2.  Grasped anteriorly with a single tooth tenaculum.  UNABLE TO SOUND UTERUS DUE TO CLOSED CX.    Assessment:  Nexplanon removal--She  was told to remove the dressing in 12-24 hours, to keep the incision area dry for 24 hours and to remove the Steristrip in 2-3  days.  Notify us if any signs of tenderness, redness, pain, or fevers develop.  Unsuccessful IUD insertion--unable to penetrate cx os. Will try Rx cytotec first. RTO for insertion.   Encounter for initial prescription of intrauterine contraceptive device (IUD) - Plan: misoprostol (CYTOTEC) 100 MCG tablet   [name] B. Selena Swaminathan, PA-C 11/20/2019 11:51 AM

## 2019-11-20 ENCOUNTER — Ambulatory Visit (INDEPENDENT_AMBULATORY_CARE_PROVIDER_SITE_OTHER): Payer: Medicaid Other | Admitting: Obstetrics and Gynecology

## 2019-11-20 ENCOUNTER — Other Ambulatory Visit: Payer: Self-pay

## 2019-11-20 ENCOUNTER — Encounter: Payer: Self-pay | Admitting: Obstetrics and Gynecology

## 2019-11-20 VITALS — BP 140/80 | Ht 60.0 in | Wt 310.0 lb

## 2019-11-20 DIAGNOSIS — Z3046 Encounter for surveillance of implantable subdermal contraceptive: Secondary | ICD-10-CM | POA: Diagnosis not present

## 2019-11-20 DIAGNOSIS — Z538 Procedure and treatment not carried out for other reasons: Secondary | ICD-10-CM

## 2019-11-20 DIAGNOSIS — Z30014 Encounter for initial prescription of intrauterine contraceptive device: Secondary | ICD-10-CM | POA: Diagnosis not present

## 2019-11-20 MED ORDER — MISOPROSTOL 100 MCG PO TABS: 100.0000 ug | ORAL_TABLET | Freq: Once | ORAL | 0 refills | Status: DC

## 2019-11-20 NOTE — Patient Instructions (Signed)
I value your feedback and entrusting us with your care. If you get a [facility] patient survey, I would appreciate you taking the time to let us know about your experience today. Thank you!  As of February 15, 2019, your lab results will be released to your MyChart immediately, before I even have a chance to see them. Please give me time to review them and contact you if there are any abnormalities. Thank you for your patience.   Remove the dressing in 24 hours,  keep the incision area dry for 24 hours and remove the Steristrip in 2-3  days.  Notify us if any signs of tenderness, redness, pain, or fevers develop.  

## 2019-12-19 ENCOUNTER — Other Ambulatory Visit (HOSPITAL_COMMUNITY)
Admission: RE | Admit: 2019-12-19 | Discharge: 2019-12-19 | Disposition: A | Discharge status: Home / Self Care | Payer: Medicaid Other | Source: Ambulatory Visit | Attending: Obstetrics and Gynecology | Admitting: Obstetrics and Gynecology

## 2019-12-19 ENCOUNTER — Ambulatory Visit (INDEPENDENT_AMBULATORY_CARE_PROVIDER_SITE_OTHER): Payer: Medicaid Other | Admitting: Obstetrics and Gynecology

## 2019-12-19 ENCOUNTER — Other Ambulatory Visit: Payer: Self-pay

## 2019-12-19 ENCOUNTER — Encounter: Payer: Self-pay | Admitting: Obstetrics and Gynecology

## 2019-12-19 VITALS — BP 130/80 | Ht 60.0 in | Wt 308.0 lb

## 2019-12-19 DIAGNOSIS — N926 Irregular menstruation, unspecified: Secondary | ICD-10-CM | POA: Diagnosis not present

## 2019-12-19 DIAGNOSIS — Z113 Encounter for screening for infections with a predominantly sexual mode of transmission: Secondary | ICD-10-CM | POA: Insufficient documentation

## 2019-12-19 DIAGNOSIS — N898 Other specified noninflammatory disorders of vagina: Secondary | ICD-10-CM | POA: Insufficient documentation

## 2019-12-19 LAB — POCT WET PREP WITH KOH
Clue Cells Wet Prep HPF POC: NEGATIVE
KOH Prep POC: NEGATIVE
Trichomonas, UA: NEGATIVE
Yeast Wet Prep HPF POC: NEGATIVE

## 2019-12-19 LAB — POCT URINE PREGNANCY: Preg Test, Ur: NEGATIVE

## 2019-12-19 NOTE — Patient Instructions (Signed)
I value your feedback and entrusting us with your care. If you get a [facility] patient survey, I would appreciate you taking the time to let us know about your experience today. Thank you!  As of February 15, 2019, your lab results will be released to your MyChart immediately, before I even have a chance to see them. Please give me time to review them and contact you if there are any abnormalities. Thank you for your patience.  

## 2019-12-19 NOTE — Progress Notes (Signed)
Leim Fabry, MD   Chief Complaint  Patient presents with  . Vaginal Discharge    itching, no odor x 6 days, pt would like UPT    HPI:      [name] is a 29 y.o. [id number] whose LMP was No LMP recorded. (Menstrual status: Other)., presents today for increased vag d/c with irritation and dysuria, no fishy odor, for 6 days. Was on abx for tooth infection recently. No meds to treat. No LBP, pelvic pain, fevers. No UTI sx other than dysuria. She is sex active with husband, hasn't had period since nexplanon removed 11/20/19. Not using condoms due to latex allergy and can't find latex free ones. Was interested in IUD but unable to penetrate cx os at nexplanon rem appt. Pt still considering IUD. Rx cytotec already eRxd.     Past Medical History:  Diagnosis Date  . Anxiety   . Asthma   . Depression     Past Surgical History:  Procedure Laterality Date  . TONSILLECTOMY      Family History  Problem Relation Age of Onset  . Breast cancer Paternal Aunt        30s    Social History   Socioeconomic History  . Marital status: Married    Spouse name: Not on file  . Number of children: Not on file  . Years of education: Not on file  . Highest education level: Not on file  Occupational History  . Not on file  Tobacco Use  . Smoking status: Former Games developer  . Smokeless tobacco: Never Used  Vaping Use  . Vaping Use: Never used  Substance and Sexual Activity  . Alcohol use: No  . Drug use: Not Currently  . Sexual activity: Yes    Birth control/protection: None  Other Topics Concern  . Not on file  Social History Narrative   Husband in [employer] 2020   Social Determinants of Health   Financial Resource Strain:   . Difficulty of Paying Living Expenses: Not on file  Food Insecurity:   . Worried About Programme researcher, broadcasting/film/video in the Last Year: Not on file  . Ran Out of Food in the Last Year: Not on file  Transportation Needs:   . Lack of Transportation (Medical): Not  on file  . Lack of Transportation (Non-Medical): Not on file  Physical Activity:   . Days of Exercise per Week: Not on file  . Minutes of Exercise per Session: Not on file  Stress:   . Feeling of Stress : Not on file  Social Connections:   . Frequency of Communication with Friends and Family: Not on file  . Frequency of Social Gatherings with Friends and Family: Not on file  . Attends Religious Services: Not on file  . Active Member of Clubs or Organizations: Not on file  . Attends Banker Meetings: Not on file  . Marital Status: Not on file  Intimate Partner Violence:   . Fear of Current or Ex-Partner: Not on file  . Emotionally Abused: Not on file  . Physically Abused: Not on file  . Sexually Abused: Not on file    Outpatient Medications Prior to Visit  Medication Sig Dispense Refill  . escitalopram (LEXAPRO) 20 MG tablet Take 1 tablet (20 mg total) by mouth daily. 30 tablet 0  . buPROPion (WELLBUTRIN SR) 150 MG 12 hr tablet Take 1 tablet by mouth 2 (two) times daily.    . misoprostol (CYTOTEC)  100 MCG tablet Take 1 tablet (100 mcg total) by mouth once for 1 dose. 2 hours before appt 1 tablet 0   No facility-administered medications prior to visit.      ROS:  Review of Systems  Constitutional: Negative for fever.  Gastrointestinal: Negative for blood in stool, constipation, diarrhea, nausea and vomiting.  Genitourinary: Positive for vaginal discharge. Negative for dyspareunia, dysuria, flank pain, frequency, hematuria, urgency, vaginal bleeding and vaginal pain.  Musculoskeletal: Negative for back pain.  Skin: Negative for rash.    OBJECTIVE:   Vitals:  BP 130/80   Ht 5' (1.524 m)   Wt (!) 308 lb (139.7 kg)   Breastfeeding No   BMI 60.15 kg/m   Physical Exam Vitals reviewed.  Constitutional:      Appearance: She is well-developed.  Pulmonary:     Effort: Pulmonary effort is normal.  Genitourinary:    Pubic Area: No rash.      Labia:         Right: No rash, tenderness or lesion.        Left: No rash, tenderness or lesion.      Vagina: Vaginal discharge present. No erythema or tenderness.     Cervix: Normal.     Uterus: Normal. Not enlarged and not tender.      Adnexa: Right adnexa normal and left adnexa normal.       Right: No mass or tenderness.         Left: No mass or tenderness.       Comments: ERYTHEMA AT INTROITUS Musculoskeletal:        General: Normal range of motion.     Cervical back: Normal range of motion.  Skin:    General: Skin is warm and dry.  Neurological:     General: No focal deficit present.     Mental Status: She is alert and oriented to person, place, and time.  Psychiatric:        Mood and Affect: Mood normal.        Behavior: Behavior normal.        Thought Content: Thought content normal.        Judgment: Judgment normal.     Results: Results for orders placed or performed in visit on 12/19/19 (from the past 24 hour(s))  POCT Wet Prep with KOH     Status: Normal   Collection Time: 12/19/19 11:50 AM  Result Value Ref Range   Trichomonas, UA Negative    Clue Cells Wet Prep HPF POC neg    Epithelial Wet Prep HPF POC     Yeast Wet Prep HPF POC neg    Bacteria Wet Prep HPF POC     RBC Wet Prep HPF POC     WBC Wet Prep HPF POC     KOH Prep POC Negative Negative  POCT urine pregnancy     Status: Normal   Collection Time: 12/19/19 11:50 AM  Result Value Ref Range   Preg Test, Ur Negative Negative   QUESTION IMMOBILE TRICH ON WET PREP  Assessment/Plan: Vaginal discharge - Plan: Cervicovaginal ancillary only, POCT Wet Prep with KOH; neg wet prep/pos sx and exam. Check culture. Will f/u with results.   Screening for STD (sexually transmitted disease) - Plan: Cervicovaginal ancillary only  Late menses - Plan: POCT urine pregnancy; neg UPT. Reassurance. F/u with menses for IUD insertion/Cytotec 1 hr before appt    Return if symptoms worsen or fail to improve.  Cashae Weich B. Lalia Loudon,  PA-C 12/19/2019 11:55 AM

## 2019-12-21 ENCOUNTER — Other Ambulatory Visit: Payer: Self-pay

## 2019-12-21 ENCOUNTER — Encounter: Payer: Self-pay | Admitting: Obstetrics and Gynecology

## 2019-12-21 LAB — CERVICOVAGINAL ANCILLARY ONLY
Bacterial Vaginitis (gardnerella): POSITIVE — AB
Candida Glabrata: NEGATIVE
Candida Vaginitis: POSITIVE — AB
Chlamydia: NEGATIVE
Comment: NEGATIVE
Comment: NEGATIVE
Comment: NEGATIVE
Comment: NEGATIVE
Comment: NEGATIVE
Comment: NORMAL
Neisseria Gonorrhea: NEGATIVE
Trichomonas: NEGATIVE

## 2019-12-21 MED ORDER — FLUCONAZOLE 150 MG PO TABS: 150.0000 mg | ORAL_TABLET | Freq: Once | ORAL | 0 refills | Status: AC

## 2019-12-21 MED ORDER — METRONIDAZOLE 500 MG PO TABS: 500.0000 mg | ORAL_TABLET | Freq: Two times a day (BID) | ORAL | 0 refills | Status: DC

## 2019-12-21 MED ORDER — METRONIDAZOLE 500 MG PO TABS: 500.0000 mg | ORAL_TABLET | Freq: Two times a day (BID) | ORAL | 0 refills | Status: AC

## 2019-12-21 MED ORDER — FLUCONAZOLE 150 MG PO TABS: 150.0000 mg | ORAL_TABLET | Freq: Once | ORAL | 0 refills | Status: DC

## 2019-12-21 NOTE — Telephone Encounter (Signed)
Called [facility] pharmacy to cancel Rxs since pt wanted them sent to [facility].

## 2019-12-21 NOTE — Addendum Note (Signed)
Addended by: Donnetta Hail on: 12/21/2019 03:17 PM   Modules accepted: Level of Service, SmartSet

## 2019-12-21 NOTE — Addendum Note (Signed)
Addended by: Althea Grimmer B on: 12/21/2019 12:03 PM   Modules accepted: Orders

## 2019-12-21 NOTE — Telephone Encounter (Signed)
This encounter was created in error - please disregard.

## 2020-01-09 ENCOUNTER — Encounter: Payer: Self-pay | Admitting: Obstetrics and Gynecology

## 2020-02-19 ENCOUNTER — Ambulatory Visit: Payer: Medicaid Other | Admitting: Obstetrics and Gynecology

## 2020-02-19 NOTE — Progress Notes (Deleted)
    Alexis Fabry, MD   No chief complaint on file.   HPI:      [name] is a 29 y.o. [id number] whose LMP was No LMP recorded. (Menstrual status: Other)., presents today for ***  No menses since nexplanon removed 9/21  Hx of BV 10/21, treated with flagyl.    Past Medical History:  Diagnosis Date  . Anxiety   . Asthma   . Depression     Past Surgical History:  Procedure Laterality Date  . TONSILLECTOMY      Family History  Problem Relation Age of Onset  . Breast cancer Paternal Aunt        51s    Social History   Socioeconomic History  . Marital status: Married    Spouse name: Not on file  . Number of children: Not on file  . Years of education: Not on file  . Highest education level: Not on file  Occupational History  . Not on file  Tobacco Use  . Smoking status: Former Games developer  . Smokeless tobacco: Never Used  Vaping Use  . Vaping Use: Never used  Substance and Sexual Activity  . Alcohol use: No  . Drug use: Not Currently  . Sexual activity: Yes    Birth control/protection: None  Other Topics Concern  . Not on file  Social History Narrative   Husband in [employer] 2020   Social Determinants of Health   Financial Resource Strain: Not on file  Food Insecurity: Not on file  Transportation Needs: Not on file  Physical Activity: Not on file  Stress: Not on file  Social Connections: Not on file  Intimate Partner Violence: Not on file    Outpatient Medications Prior to Visit  Medication Sig Dispense Refill  . escitalopram (LEXAPRO) 20 MG tablet Take 1 tablet (20 mg total) by mouth daily. 30 tablet 0   No facility-administered medications prior to visit.      ROS:  Review of Systems BREAST: No symptoms   OBJECTIVE:   Vitals:  There were no vitals taken for this visit.  Physical Exam  Results: No results found for this or any previous visit (from the past 24 hour(s)).   Assessment/Plan: No diagnosis found.    No  orders of the defined types were placed in this encounter.     No follow-ups on file.  Braiden Presutti B. Britten Parady, PA-C 02/19/2020 3:05 PM

## 2020-02-21 ENCOUNTER — Ambulatory Visit: Payer: Medicaid Other | Admitting: Obstetrics and Gynecology

## 2020-03-08 NOTE — L&D Delivery Note (Signed)
Vaginal Delivery Note  Spontaneous delivery of live viable female infant from the ROA position through an intact perineum. Delivery of anterior left shoulder with gentle downward guidance followed by delivery of the right posterior shoulder with gentle upward guidance. Body followed spontaneously. Infant placed on maternal chest. Nursery present and helped with neonatal resuscitation and evaluation. Cord clamped and cut after one minute. Cord blood collected. Placenta delivered spontaneously and intact with a 3 vessel cord.  No laceration. Uterus firm and below umbilicus at the end of the delivery.  Mom and baby recovering in stable condition. Sponge and needle counts were correct at the end of the delivery.  APGARS: 1 minute:9 5 minutes: 9 Weight: pending  Adelene Idler MD [facility], [facility] 02/17/21 11:13 AM

## 2020-03-12 ENCOUNTER — Other Ambulatory Visit: Payer: Self-pay

## 2020-03-12 ENCOUNTER — Ambulatory Visit (INDEPENDENT_AMBULATORY_CARE_PROVIDER_SITE_OTHER): Payer: Medicaid Other | Admitting: Obstetrics and Gynecology

## 2020-03-12 ENCOUNTER — Encounter: Payer: Self-pay | Admitting: Obstetrics and Gynecology

## 2020-03-12 VITALS — BP 128/76 | HR 74 | Temp 99.1°F | Ht 60.0 in | Wt 318.0 lb

## 2020-03-12 DIAGNOSIS — Z30011 Encounter for initial prescription of contraceptive pills: Secondary | ICD-10-CM | POA: Diagnosis not present

## 2020-03-12 MED ORDER — NORETHINDRONE 0.35 MG PO TABS: 1.0000 | ORAL_TABLET | Freq: Every day | ORAL | 1 refills | Status: DC

## 2020-03-12 NOTE — Progress Notes (Signed)
Leim Fabry, MD   Chief Complaint  Patient presents with  . Contraception    HPI:      [name] is a 30 y.o. [id number] whose LMP was [name] last menstrual period was 03/11/2020., presents today for [facility] consult. No menses since nexplanon removed 9/21 until 03/08/20. Has had 2 days of light bleeding since. 2 neg home UPTs. Would like to restart BC. Increased risk of blood clots during pregnancy, requiring injections and pills, per pt report. No hx of HTN, migraines. Has had 2 mirenas (1 removed due to pain for partner, 1 removed due to hives  and thought related, but hives persisted after removal) and 2 nexplanons (1 removed early due to irreg bleeding, 2nd one removed due to pain in her arm and bleeding). Doesn't want nexplanon again. Unsure if wants another IUD. Did OCPs in distant past, got pregnant on OCPs after missed pills.  Hx of BV 10/21, treated with flagyl. No sx recurrence.  Still having hives. Taking claritin BID. Uses unscented soap, unsure if detergent unscented., no dryer sheets.   Past Medical History:  Diagnosis Date  . Anxiety   . Asthma   . Depression     Past Surgical History:  Procedure Laterality Date  . TONSILLECTOMY      Family History  Problem Relation Age of Onset  . Breast cancer Paternal Aunt        71s    Social History   Socioeconomic History  . Marital status: Married    Spouse name: Not on file  . Number of children: Not on file  . Years of education: Not on file  . Highest education level: Not on file  Occupational History  . Not on file  Tobacco Use  . Smoking status: Former Games developer  . Smokeless tobacco: Never Used  Vaping Use  . Vaping Use: Never used  Substance and Sexual Activity  . Alcohol use: No  . Drug use: Not Currently  . Sexual activity: Yes    Birth control/protection: None  Other Topics Concern  . Not on file  Social History Narrative   Husband in [employer] 2020   Social Determinants of Health    Financial Resource Strain: Not on file  Food Insecurity: Not on file  Transportation Needs: Not on file  Physical Activity: Not on file  Stress: Not on file  Social Connections: Not on file  Intimate Partner Violence: Not on file    Outpatient Medications Prior to Visit  Medication Sig Dispense Refill  . escitalopram (LEXAPRO) 20 MG tablet Take 1 tablet (20 mg total) by mouth daily. 30 tablet 0   No facility-administered medications prior to visit.      ROS:  Review of Systems  Constitutional: Negative for fever, malaise/fatigue and weight loss.  Gastrointestinal: Negative for blood in stool, constipation, diarrhea, nausea and vomiting.  Genitourinary: Negative for dyspareunia, dysuria, flank pain, frequency, hematuria, urgency, vaginal bleeding, vaginal discharge and vaginal pain.  Musculoskeletal: Negative for back pain.  Skin: Negative for itching and rash.    OBJECTIVE:   Vitals:  BP 128/76   Pulse 74   Temp 99.1 F (37.3 C)   Ht 5' (1.524 m)   Wt (!) 318 lb (144.2 kg)   LMP 03/11/2020   SpO2 99%   BMI 62.11 kg/m   Physical Exam Vitals reviewed.  Constitutional:      Appearance: She is well-developed.  Pulmonary:     Effort: Pulmonary effort is normal.  Musculoskeletal:        General: Normal range of motion.     Cervical back: Normal range of motion.  Skin:    General: Skin is warm and dry.  Neurological:     General: No focal deficit present.     Mental Status: She is alert and oriented to person, place, and time.     Cranial Nerves: No cranial nerve deficit.  Psychiatric:        Mood and Affect: Mood normal.        Behavior: Behavior normal.        Thought Content: Thought content normal.        Judgment: Judgment normal.    Assessment/Plan: Encounter for initial prescription of contraceptive pills - Plan: norethindrone (MICRONOR) 0.35 MG tablet; prog only options discussed. Pt wants to try POPs for now. Rx eRxd, start today, condoms for 1  wk. F/u prn.  Annual/pap due 10/22   Meds ordered this encounter  Medications  . norethindrone (MICRONOR) 0.35 MG tablet    Sig: Take 1 tablet (0.35 mg total) by mouth daily.    Dispense:  84 tablet    Refill:  1    Order Specific Question:   Supervising Provider    Answer:   Nadara Mustard [[id number]]      Return if symptoms worsen or fail to improve.  Nicholette Dolson B. Ardell Aaronson, PA-C 03/12/2020 3:43 PM

## 2020-03-12 NOTE — Patient Instructions (Signed)
I value your feedback and you entrusting us with your care. If you get a [facility] patient survey, I would appreciate you taking the time to let us know about your experience today. Thank you! ? ? ?

## 2020-06-27 ENCOUNTER — Other Ambulatory Visit: Payer: Self-pay

## 2020-06-27 ENCOUNTER — Ambulatory Visit (LOCAL_COMMUNITY_HEALTH_CENTER): Payer: Medicaid Other

## 2020-06-27 VITALS — BP 125/76 | Ht 60.0 in | Wt 320.5 lb

## 2020-06-27 DIAGNOSIS — Z3201 Encounter for pregnancy test, result positive: Secondary | ICD-10-CM

## 2020-06-27 LAB — PREGNANCY, URINE: Preg Test, Ur: POSITIVE — AB

## 2020-06-27 MED ORDER — PRENATAL 27-0.8 MG PO TABS: 1.0000 | ORAL_TABLET | Freq: Every day | ORAL | 0 refills | Status: DC

## 2020-06-27 NOTE — Progress Notes (Signed)
UPT positive. Plans prenatal care at [facility]. Advised to establish prenatal care soon. Plans to contact prenatal provider and DSS/Medicaid next week. Jerel Shepherd, RN

## 2020-07-08 ENCOUNTER — Other Ambulatory Visit (HOSPITAL_COMMUNITY)
Admission: RE | Admit: 2020-07-08 | Discharge: 2020-07-08 | Disposition: A | Discharge status: Home / Self Care | Payer: Medicaid Other | Source: Ambulatory Visit | Attending: Obstetrics and Gynecology | Admitting: Obstetrics and Gynecology

## 2020-07-08 ENCOUNTER — Encounter: Payer: Self-pay | Admitting: Obstetrics and Gynecology

## 2020-07-08 ENCOUNTER — Ambulatory Visit (INDEPENDENT_AMBULATORY_CARE_PROVIDER_SITE_OTHER): Payer: Medicaid Other | Admitting: Obstetrics and Gynecology

## 2020-07-08 ENCOUNTER — Other Ambulatory Visit: Payer: Self-pay

## 2020-07-08 VITALS — BP 120/80 | Ht 60.0 in | Wt 320.0 lb

## 2020-07-08 DIAGNOSIS — Z3481 Encounter for supervision of other normal pregnancy, first trimester: Secondary | ICD-10-CM | POA: Diagnosis not present

## 2020-07-08 DIAGNOSIS — Z3200 Encounter for pregnancy test, result unknown: Secondary | ICD-10-CM | POA: Diagnosis not present

## 2020-07-08 DIAGNOSIS — O0990 Supervision of high risk pregnancy, unspecified, unspecified trimester: Secondary | ICD-10-CM | POA: Diagnosis not present

## 2020-07-08 DIAGNOSIS — Z9189 Other specified personal risk factors, not elsewhere classified: Secondary | ICD-10-CM | POA: Diagnosis not present

## 2020-07-08 DIAGNOSIS — Z3490 Encounter for supervision of normal pregnancy, unspecified, unspecified trimester: Secondary | ICD-10-CM | POA: Insufficient documentation

## 2020-07-08 LAB — POCT URINE PREGNANCY: Preg Test, Ur: POSITIVE — AB

## 2020-07-08 NOTE — Progress Notes (Signed)
07/08/2020   Chief Complaint: Missed period  Transfer of Care Patient: no  History of Present Illness: Alexis Gutierrez is a 30 y.o. G3P2002 [redacted]w[redacted]d based on [name] last menstrual period was 05/04/2020. with an Estimated Date of Delivery: 02/08/21, with the above CC.   Her periods were: monthly She was using no method when she conceived.  She has Positive signs or symptoms of nausea/vomiting of pregnancy. She has Negative signs or symptoms of miscarriage or preterm labor She was not taking different medications around the time she conceived/early pregnancy. Since her LMP, she has not used alcohol Since her LMP, she has not used tobacco products Since her LMP, she has not used illegal drugs.    Current or past history of domestic violence. no  Infection History:  1. Since her LMP, she has not had a viral illness.  2. She reports close contact with children on a regular basis  no  3. She denies a history of chicken pox. She reports vaccination for chicken pox in the past. 4. Patient or partner has history of genital herpes  no 5. History of STI (GC, CT, HPV, syphilis, HIV)  no   6.  She does not live with someone with TB or TB exposed. 7. History of recent travel :  no 8. She identifies Negative Zika risk factors for her and her partner 50. There are cats in the home in the home.  She understands that while pregnant she should not change cat litter.   Genetic Screening Questions: (Includes patient, baby's father, or anyone in either family)   1. [name] age >/= 32 at [facility]  no 2. Thalassemia ([place], [place], Mediterranean, or Asian background): MCV<80  no 3. Neural tube defect (meningomyelocele, spina bifida, anencephaly)  no 4. Congenital heart defect  no  5. Down syndrome  no 6. Tay-Sachs (Jewish, [place]  no 7. Canavan's Disease  no 8. Sickle cell disease or trait (African)  no  9. Hemophilia or other blood disorders  no  10. Muscular dystrophy  no  11. Cystic fibrosis   no  12. Huntington's Chorea  no  13. Mental retardation/autism  FOB has autism  14. Other inherited genetic or chromosomal disorder  no 15. Maternal metabolic disorder (DM, PKU, etc)  no 16. Patient or FOB with a child with a birth defect not listed above no  16a. Patient or FOB with a birth defect themselves no 17. Recurrent pregnancy loss, or stillbirth  no  18. Any medications since LMP other than prenatal vitamins (include vitamins, supplements, OTC meds, drugs, alcohol)  no 19. Any other genetic/environmental exposure to discuss  no  ROS:  Review of Systems  Constitutional: Negative for chills, fever, malaise/fatigue and weight loss.  HENT: Negative for congestion, hearing loss and sinus pain.   Eyes: Negative for blurred vision and double vision.  Respiratory: Negative for cough, sputum production, shortness of breath and wheezing.   Cardiovascular: Negative for chest pain, palpitations, orthopnea and leg swelling.  Gastrointestinal: Negative for abdominal pain, constipation, diarrhea, nausea and vomiting.  Genitourinary: Negative for dysuria, flank pain, frequency, hematuria and urgency.  Musculoskeletal: Negative for back pain, falls and joint pain.  Skin: Negative for itching and rash.  Neurological: Negative for dizziness and headaches.  Psychiatric/Behavioral: Negative for depression, substance abuse and suicidal ideas. The patient is not nervous/anxious.     OBGYN History: As per HPI. OB History  Gravida Para Term Preterm AB Living  3 2 2  0 0 2  SAB  IAB Ectopic Multiple Live Births        0 2    # Outcome Date GA Lbr Len/2nd Weight Sex Delivery Anes PTL Lv  3 Current           2 Term 07/25/18 [redacted]w[redacted]d 03:20 / 01:25 6 lb 15.8 oz (3.17 kg) M Vag-Spont EPI  LIV  1 Term 02/10/11 [redacted]w[redacted]d  7 lb 7 oz (3.374 kg) F Vag-Spont   LIV    Any issues with any prior pregnancies: no Any prior children are healthy, doing well, without any problems or issues: yes Last pap smear  2019 History of STIs: No   Past Medical History: Past Medical History:  Diagnosis Date  . Anxiety   . Asthma   . Depression     Past Surgical History: Past Surgical History:  Procedure Laterality Date  . TONSILLECTOMY      Family History:  Family History  Problem Relation Age of Onset  . Breast cancer Paternal Aunt        56s   She denies any female cancers, bleeding or blood clotting disorders.   Social History:  Social History   Socioeconomic History  . Marital status: Married    Spouse name: Not on file  . Number of children: Not on file  . Years of education: Not on file  . Highest education level: Not on file  Occupational History  . Not on file  Tobacco Use  . Smoking status: Former Smoker    Quit date: 06/11/2020    Years since quitting: 0.0  . Smokeless tobacco: Never Used  Vaping Use  . Vaping Use: Never used  Substance and Sexual Activity  . Alcohol use: No  . Drug use: Not Currently  . Sexual activity: Yes    Birth control/protection: None    Comment: hx ocp- stopped 04/2020  Other Topics Concern  . Not on file  Social History Narrative   Husband in [employer] 2020   Social Determinants of Health   Financial Resource Strain: Not on file  Food Insecurity: Not on file  Transportation Needs: Not on file  Physical Activity: Not on file  Stress: Not on file  Social Connections: Not on file  Intimate Partner Violence: Not At Risk  . Fear of Current or Ex-Partner: No  . Emotionally Abused: No  . Physically Abused: No  . Sexually Abused: No    Allergy: Allergies  Allergen Reactions  . Amoxicillin Shortness Of Breath    Has patient had a PCN reaction causing immediate rash, facial/tongue/throat swelling, SOB or lightheadedness with hypotension: Yes Has patient had a PCN reaction causing severe rash involving mucus membranes or skin necrosis: No Has patient had a PCN reaction that required hospitalization: No Has patient had a PCN reaction  occurring within the last 10 years: No If all of the above answers are "NO", then may proceed with Cephalosporin use.  . Eggs Or Egg-Derived Products Other (See Comments)    Acid reflux  . Penicillins Swelling    Has patient had a PCN reaction causing immediate rash, facial/tongue/throat swelling, SOB or lightheadedness with hypotension: Yes Has patient had a PCN reaction causing severe rash involving mucus membranes or skin necrosis: No Has patient had a PCN reaction that required hospitalization: No Has patient had a PCN reaction occurring within the last 10 years: No If all of the above answers are "NO", then may proceed with Cephalosporin use.   . Tomato Other (See Comments)    Acid  reflux     Current Outpatient Medications:  Current Outpatient Medications:  .  Prenatal Vit-Fe Fumarate-FA (MULTIVITAMIN-PRENATAL) 27-0.8 MG TABS tablet, Take 1 tablet by mouth daily at 12 noon., Disp: 100 tablet, Rfl: 0 .  escitalopram (LEXAPRO) 20 MG tablet, Take 1 tablet (20 mg total) by mouth daily. (Patient not taking: No sig reported), Disp: 30 tablet, Rfl: 0 .  norethindrone (MICRONOR) 0.35 MG tablet, Take 1 tablet (0.35 mg total) by mouth daily. (Patient not taking: No sig reported), Disp: 84 tablet, Rfl: 1    Do you snore loudly? ( louder than talking or loud enough to be heard through closed doors?) no Do you often feel tired, fatigued, or sleepy during daytime? yes Has anyone observed you stop breathing during sleep? No Do you have or are you being treated for high blood pressure? NO BMI> 35 yes Age> 50 no Neck circumference > 40 cm yes Female gender? no  Physical Exam: Physical Exam Vitals and nursing note reviewed. Exam conducted with a chaperone present.  HENT:     Head: Normocephalic and atraumatic.  Eyes:     Pupils: Pupils are equal, round, and reactive to light.  Neck:     Thyroid: No thyromegaly.  Cardiovascular:     Rate and Rhythm: Normal rate and regular rhythm.   Pulmonary:     Effort: Pulmonary effort is normal.  Abdominal:     General: Bowel sounds are normal. There is no distension.     Palpations: Abdomen is soft.     Tenderness: There is no abdominal tenderness. There is no guarding or rebound.  Genitourinary:    Comments: External: Normal appearing vulva. No lesions noted.  Speculum examination: Normal appearing cervix. No blood in the vaginal vault. no discharge.   Bimanual examination: Uterus midline, non-tender, normal in size, shape and contour.  No CMT. No adnexal masses. No adnexal tenderness. Pelvis not fixed.  Musculoskeletal:        General: Normal range of motion.     Cervical back: Normal range of motion and neck supple.  Skin:    General: Skin is warm and dry.  Neurological:     Mental Status: She is alert and oriented to person, place, and time.  Psychiatric:        Mood and Affect: Affect normal.        Judgment: Judgment normal.      Assessment: Alexis Gutierrez is a 30 y.o. G3P2002 [redacted]w[redacted]d based on [name] last menstrual period was 05/04/2020. with an Estimated Date of Delivery: 02/08/21,  for prenatal care.  Plan:  1) Avoid alcoholic beverages. 2) Patient encouraged not to smoke.  3) Discontinue the use of all non-medicinal drugs and chemicals.  4) Take prenatal vitamins daily.  5) Seatbelt use advised 6) Nutrition, food safety (fish, cheese advisories, and high nitrite foods) and exercise discussed. 7) Hospital and practice style delivering at [facility] discussed  8) Patient is asked about travel to areas at risk for the Zika virus, and counseled to avoid travel and exposure to mosquitoes or sexual partners who may have themselves been exposed to the virus. Testing is discussed, and will be ordered as appropriate.  9) Childbirth classes at [facility] advised 10) Genetic Screening, such as with 1st Trimester Screening, cell free fetal DNA, AFP testing, and Ultrasound, as well as with amniocentesis and CVS as appropriate, is  discussed with patient. She plans to consider genetic testing this pregnancy.  Bedside transvaginal US showed an IUP at 7 weeks 4  days with + FHR at 154 bpm.  Follow up for official dating US.  NOB labs today Encouraged daily ASA at 12 weeks until delivery. Marland Kitchen.  1GTT testing next visit.  Referral for sleep apnea screening today.  Problem list reviewed and updated.  I discussed the assessment and treatment plan with the patient. The patient was provided an opportunity to ask questions and all were answered. The patient agreed with the plan and demonstrated an understanding of the instructions.  Adelene Idlerhristanna Ulus Hazen MD [facility], [facility] 07/08/2020 3:41 PM

## 2020-07-09 LAB — RPR+RH+ABO+RUB AB+AB SCR+CB...
Antibody Screen: NEGATIVE
HIV Screen 4th Generation wRfx: NONREACTIVE
Hematocrit: 35.4 % (ref 34.0–46.6)
Hemoglobin: 11.5 g/dL (ref 11.1–15.9)
Hepatitis B Surface Ag: NEGATIVE
MCH: 25.8 pg — ABNORMAL LOW (ref 26.6–33.0)
MCHC: 32.5 g/dL (ref 31.5–35.7)
MCV: 79 fL (ref 79–97)
Platelets: 253 10*3/uL (ref 150–450)
RBC: 4.46 x10E6/uL (ref 3.77–5.28)
RDW: 14.2 % (ref 11.7–15.4)
RPR Ser Ql: NONREACTIVE
Rh Factor: POSITIVE
Rubella Antibodies, IGG: 3.47 index (ref >0.99)
Varicella zoster IgG: 3649 index (ref >165)
WBC: 7.3 10*3/uL (ref 3.4–10.8)

## 2020-07-09 LAB — MONITOR DRUG PROFILE 10(MW)
Amphetamine Scrn, Ur: NEGATIVE ng/mL
BARBITURATE SCREEN URINE: NEGATIVE ng/mL
BENZODIAZEPINE SCREEN, URINE: NEGATIVE ng/mL
CANNABINOIDS UR QL SCN: NEGATIVE ng/mL
Cocaine (Metab) Scrn, Ur: NEGATIVE ng/mL
Creatinine(Crt), U: 203.3 mg/dL (ref 20.0–300.0)
Methadone Screen, Urine: NEGATIVE ng/mL
OXYCODONE+OXYMORPHONE UR QL SCN: NEGATIVE ng/mL
Opiate Scrn, Ur: NEGATIVE ng/mL
Ph of Urine: 7.2 (ref 4.5–8.9)
Phencyclidine Qn, Ur: NEGATIVE ng/mL
Propoxyphene Scrn, Ur: NEGATIVE ng/mL

## 2020-07-09 LAB — HEPATITIS C ANTIBODY: Hep C Virus Ab: <0.1 s/co ratio (ref 0.0–0.9)

## 2020-07-11 ENCOUNTER — Other Ambulatory Visit: Payer: Self-pay | Admitting: Obstetrics and Gynecology

## 2020-07-11 DIAGNOSIS — N3000 Acute cystitis without hematuria: Secondary | ICD-10-CM

## 2020-07-11 LAB — URINE CULTURE

## 2020-07-11 MED ORDER — NITROFURANTOIN MONOHYD MACRO 100 MG PO CAPS: 100.0000 mg | ORAL_CAPSULE | Freq: Two times a day (BID) | ORAL | 0 refills | Status: DC

## 2020-07-15 LAB — CYTOLOGY - PAP
Chlamydia: NEGATIVE
Comment: NEGATIVE
Comment: NEGATIVE
Comment: NEGATIVE
Comment: NEGATIVE
Comment: NORMAL
Diagnosis: NEGATIVE
HPV 16: NEGATIVE
HPV 18 / 45: NEGATIVE
High risk HPV: POSITIVE — AB
Neisseria Gonorrhea: NEGATIVE
Trichomonas: NEGATIVE

## 2020-07-22 ENCOUNTER — Ambulatory Visit (INDEPENDENT_AMBULATORY_CARE_PROVIDER_SITE_OTHER): Payer: Medicaid Other | Admitting: Obstetrics and Gynecology

## 2020-07-22 ENCOUNTER — Other Ambulatory Visit: Payer: Self-pay

## 2020-07-22 ENCOUNTER — Encounter: Payer: Self-pay | Admitting: Obstetrics and Gynecology

## 2020-07-22 VITALS — BP 130/80 | Wt 316.0 lb

## 2020-07-22 DIAGNOSIS — Z3A09 9 weeks gestation of pregnancy: Secondary | ICD-10-CM

## 2020-07-22 DIAGNOSIS — O0991 Supervision of high risk pregnancy, unspecified, first trimester: Secondary | ICD-10-CM

## 2020-07-22 DIAGNOSIS — O99210 Obesity complicating pregnancy, unspecified trimester: Secondary | ICD-10-CM

## 2020-07-22 DIAGNOSIS — O219 Vomiting of pregnancy, unspecified: Secondary | ICD-10-CM

## 2020-07-22 LAB — POCT URINALYSIS DIPSTICK OB
Glucose, UA: NEGATIVE
POC,PROTEIN,UA: NEGATIVE

## 2020-07-22 MED ORDER — DOXYLAMINE-PYRIDOXINE 10-10 MG PO TBEC: DELAYED_RELEASE_TABLET | ORAL | 3 refills | Status: DC

## 2020-07-22 NOTE — Addendum Note (Signed)
Addended by: Cornelius Moras D on: 07/22/2020 11:01 AM   Modules accepted: Orders

## 2020-07-22 NOTE — Patient Instructions (Signed)
For nausea (these may be purchased over-the-counter): -Vitamin B6 (pyridoxine):  25 mg three times each day (may buy 100 mg tablet and take twice per day or try to cut into 4 equal pieces and take 1 piece three times each day).  - doxylamine (found in [employer] and other sleep agents that can be bought in the store): take 25 - 50 mg at bedtime.  May take up to 25 mg three time each day.  However, keep in mind that this might make you sleepy.  

## 2020-07-22 NOTE — Progress Notes (Signed)
Routine Prenatal Care Visit  Subjective  Alexis Gutierrez is a 30 y.o. G3P2002 at [redacted]w[redacted]d being seen today for ongoing prenatal care.  She is currently monitored for the following issues for this high-risk pregnancy and has Obesity in pregnancy; Morbid obesity with BMI of 50.0-59.9, adult (HCC); Ovarian cyst affecting pregnancy in first trimester, antepartum; History of learning disability; Pelvic pain affecting pregnancy; Hives; and Supervision of high risk pregnancy, antepartum on their problem list.  ----------------------------------------------------------------------------------- Patient reports no complaints.    . Vag. Bleeding: None.   . Leaking Fluid denies.  ----------------------------------------------------------------------------------- The following portions of the patient's history were reviewed and updated as appropriate: allergies, current medications, past family history, past medical history, past social history, past surgical history and problem list. Problem list updated.  Objective  Blood pressure 130/80, weight (!) 316 lb (143.3 kg), last menstrual period 05/16/2020, unknown if currently breastfeeding. Pregravid weight 320 lb (145.2 kg) Total Weight Gain -4 lb (-1.814 kg) Urinalysis: Urine Protein    Urine Glucose    Fetal Status: Fetal Heart Rate (bpm): 164         General:  Alert, oriented and cooperative. Patient is in no acute distress.  Skin: Skin is warm and dry. No rash noted.   Cardiovascular: Normal heart rate noted  Respiratory: Normal respiratory effort, no problems with respiration noted  Abdomen: Soft, gravid, appropriate for gestational age. Pain/Pressure: Present     Pelvic:  Cervical exam deferred        Extremities: Normal range of motion.     Mental Status: Normal mood and affect. Normal behavior. Normal judgment and thought content.   Assessment   30 y.o. [id number] at [redacted]w[redacted]d by  02/20/2021, by Last Menstrual Period presenting for routine prenatal  visit  Plan   pregnancy3  Problems (from 05/14/20 to present)    Problem Noted Resolved   Supervision of high risk pregnancy, antepartum 07/08/2020 by Natale Milch, MD No   Overview Addendum 07/22/2020 10:44 AM by Conard Novak, MD     Nursing Staff Provider  Office Location  [facility]    Language  English Anatomy US    Flu Vaccine   Genetic Screen  NIPS:   TDaP vaccine    Hgb A1C or  GTT Early : Third trimester :   Covid  vaccinated   LAB RESULTS   Rhogam   Blood Type A/Positive/-- (05/03 1603)   Feeding Plan  Antibody Negative (05/03 1603)  Contraception  Rubella 3.47 (05/03 1603)  Circumcision  RPR Non Reactive (05/03 1603)   Pediatrician   HBsAg Negative (05/03 1603)   Support Person  HIV Non Reactive (05/03 1603)  Prenatal Classes  Varicella Immune    GBS  (For PCN allergy, check sensitivities)   BTL Consent     VBAC Consent  Pap  2022, NILm HPV+ (16/18 neg), Pap 01/2019, ASCUS, HPV neg    Hgb Electro      CF      SMA     Hep C neg   Obesity: Pregravid BMI: 62       Previous Version   Obesity in pregnancy 12/27/2017 by Farrel Conners, CNM No   Overview Addendum 05/29/2018  5:01 PM by Conard Novak, MD    BMI>50 BMI >=40 [x ] early 1h gtt -  [x ] u/s for dating [x ]  (x) anesthesia consult at 24 weeks [ ]  anesthesia c/s at 35-36 weeks [x]  nutritional goals [x]  folic acid 1mg  [x]   bASA (>12 weeks) [x]  consider nutrition consult [ ]  consider maternal EKG 1st trimester [ ]  Growth u/s 28 [ ] , 32 [x] , 36 weeks [ ]  [ ]  NST/AFI weekly 36+ weeks (36[] , 37[] , 38[] , 39[] , 40[] ) [ ]  IOL by 41 weeks (scheduled, prn [] )      Previous Version       Preterm labor symptoms and general obstetric precautions including but not limited to vaginal bleeding, contractions, leaking of fluid and fetal movement were reviewed in detail with the patient. Please refer to After Visit Summary for other counseling recommendations.   - Diclegis for  nausea  Return for Keep previously scheduled appt (ADD 1 hour gtt with labs).   , MD, OB/GYN, [facility] 07/22/2020 10:59 AM

## 2020-07-28 ENCOUNTER — Encounter: Payer: Medicaid Other | Admitting: Obstetrics & Gynecology

## 2020-08-05 ENCOUNTER — Encounter: Payer: Self-pay | Admitting: Obstetrics & Gynecology

## 2020-08-05 ENCOUNTER — Other Ambulatory Visit: Payer: Medicaid Other

## 2020-08-05 ENCOUNTER — Ambulatory Visit (INDEPENDENT_AMBULATORY_CARE_PROVIDER_SITE_OTHER): Payer: Medicaid Other | Admitting: Obstetrics & Gynecology

## 2020-08-05 ENCOUNTER — Other Ambulatory Visit: Payer: Self-pay

## 2020-08-05 VITALS — BP 120/80 | Wt 320.0 lb

## 2020-08-05 DIAGNOSIS — Z3A11 11 weeks gestation of pregnancy: Secondary | ICD-10-CM

## 2020-08-05 DIAGNOSIS — Z3481 Encounter for supervision of other normal pregnancy, first trimester: Secondary | ICD-10-CM

## 2020-08-05 DIAGNOSIS — O99210 Obesity complicating pregnancy, unspecified trimester: Secondary | ICD-10-CM | POA: Insufficient documentation

## 2020-08-05 DIAGNOSIS — O0991 Supervision of high risk pregnancy, unspecified, first trimester: Secondary | ICD-10-CM

## 2020-08-05 DIAGNOSIS — O99213 Obesity complicating pregnancy, third trimester: Secondary | ICD-10-CM | POA: Diagnosis not present

## 2020-08-05 DIAGNOSIS — Z1379 Encounter for other screening for genetic and chromosomal anomalies: Secondary | ICD-10-CM

## 2020-08-05 DIAGNOSIS — N926 Irregular menstruation, unspecified: Secondary | ICD-10-CM

## 2020-08-05 NOTE — Patient Instructions (Signed)
Thank you for choosing [facility]. As part of our ongoing efforts to improve patient experience, we would appreciate your feedback. Please fill out the short survey that you will receive by mail or MyChart. Your opinion is important to us! -Dr Bary Limbach  Second Trimester of Pregnancy  The second trimester of pregnancy is from week 13 through week 27. This is months 4 through 6 of pregnancy. The second trimester is often a time when you feel your best. Your body has adjusted to being pregnant, and you begin to feel better physically. During the second trimester:  Morning sickness has lessened or stopped completely.  You may have more energy.  You may have an increase in appetite. The second trimester is also a time when the unborn baby (fetus) is growing rapidly. At the end of the sixth month, the fetus may be up to 12 inches long and weigh about 1 pounds. You will likely begin to feel the baby move (quickening) between 16 and 20 weeks of pregnancy. Body changes during your second trimester Your body continues to go through many changes during your second trimester. The changes vary and generally return to normal after the baby is born. Physical changes  Your weight will continue to increase. You will notice your lower abdomen bulging out.  You may begin to get stretch marks on your hips, abdomen, and breasts.  Your breasts will continue to grow and to become tender.  Dark spots or blotches (chloasma or mask of pregnancy) may develop on your face.  A dark line from your belly button to the pubic area (linea nigra) may appear.  You may have changes in your hair. These can include thickening of your hair, rapid growth, and changes in texture. Some people also have hair loss during or after pregnancy, or hair that feels dry or thin. Health changes  You may develop headaches.  You may have heartburn.  You may develop constipation.  You may develop hemorrhoids or swollen, bulging  veins (varicose veins).  Your gums may bleed and may be sensitive to brushing and flossing.  You may urinate more often because the fetus is pressing on your bladder.  You may have back pain. This is caused by: ? Weight gain. ? Pregnancy hormones that are relaxing the joints in your pelvis. ? A shift in weight and the muscles that support your balance. Follow these instructions at home: Medicines  Follow your health care provider's instructions regarding medicine use. Specific medicines may be either safe or unsafe to take during pregnancy. Do not take any medicines unless approved by your health care provider.  Take a prenatal vitamin that contains at least 600 micrograms (mcg) of folic acid. Eating and drinking  Eat a healthy diet that includes fresh fruits and vegetables, whole grains, good sources of protein such as meat, eggs, or tofu, and low-fat dairy products.  Avoid raw meat and unpasteurized juice, milk, and cheese. These carry germs that can harm you and your baby.  You may need to take these actions to prevent or treat constipation: ? Drink enough fluid to keep your urine pale yellow. ? Eat foods that are high in fiber, such as beans, whole grains, and fresh fruits and vegetables. ? Limit foods that are high in fat and processed sugars, such as fried or sweet foods. Activity  Exercise only as directed by your health care provider. Most people can continue their usual exercise routine during pregnancy. Try to exercise for 30 minutes at least   5 days a week. Stop exercising if you develop contractions in your uterus.  Stop exercising if you develop pain or cramping in the lower abdomen or lower back.  Avoid exercising if it is very hot or humid or if you are at a high altitude.  Avoid heavy lifting.  If you choose to, you may have sex unless your health care provider tells you not to. Relieving pain and discomfort  Wear a supportive bra to prevent discomfort from  breast tenderness.  Take warm sitz baths to soothe any pain or discomfort caused by hemorrhoids. Use hemorrhoid cream if your health care provider approves.  Rest with your legs raised (elevated) if you have leg cramps or low back pain.  If you develop varicose veins: ? Wear support hose as told by your health care provider. ? Elevate your feet for 15 minutes, 3-4 times a day. ? Limit salt in your diet. Safety  Wear your seat belt at all times when driving or riding in a car.  Talk with your health care provider if someone is verbally or physically abusive to you. Lifestyle  Do not use hot tubs, steam rooms, or saunas.  Do not douche. Do not use tampons or scented sanitary pads.  Avoid cat litter boxes and soil used by cats. These carry germs that can cause birth defects in the baby and possibly loss of the fetus by miscarriage or stillbirth.  Do not use herbal remedies, alcohol, illegal drugs, or medicines that are not approved by your health care provider. Chemicals in these products can harm your baby.  Do not use any products that contain nicotine or tobacco, such as cigarettes, e-cigarettes, and chewing tobacco. If you need help quitting, ask your health care provider. General instructions  During a routine prenatal visit, your health care provider will do a physical exam and other tests. He or she will also discuss your overall health. Keep all follow-up visits. This is important.  Ask your health care provider for a referral to a local prenatal education class.  Ask for help if you have counseling or nutritional needs during pregnancy. Your health care provider can offer advice or refer you to specialists for help with various needs. Where to find more information  American Pregnancy Association: americanpregnancy.org  American College of Obstetricians and Gynecologists: acog.org/en/Womens%20Health/Pregnancy  Office on Women's Health: womenshealth.gov/pregnancy Contact  a health care provider if you have:  A headache that does not go away when you take medicine.  Vision changes or you see spots in front of your eyes.  Mild pelvic cramps, pelvic pressure, or nagging pain in the abdominal area.  Persistent nausea, vomiting, or diarrhea.  A bad-smelling vaginal discharge or foul-smelling urine.  Pain when you urinate.  Sudden or extreme swelling of your face, hands, ankles, feet, or legs.  A fever. Get help right away if you:  Have fluid leaking from your vagina.  Have spotting or bleeding from your vagina.  Have severe abdominal cramping or pain.  Have difficulty breathing.  Have chest pain.  Have fainting spells.  Have not felt your baby move for the time period told by your health care provider.  Have new or increased pain, swelling, or redness in an arm or leg. Summary  The second trimester of pregnancy is from week 13 through week 27 (months 4 through 6).  Do not use herbal remedies, alcohol, illegal drugs, or medicines that are not approved by your health care provider. Chemicals in these products can   harm your baby.  Exercise only as directed by your health care provider. Most people can continue their usual exercise routine during pregnancy.  Keep all follow-up visits. This is important. This information is not intended to replace advice given to you by your health care provider. Make sure you discuss any questions you have with your health care provider. Document Revised: 08/01/2019 Document Reviewed: 06/07/2019 Elsevier Patient Education  2021 [employer].  

## 2020-08-05 NOTE — Progress Notes (Signed)
  Subjective  Nausea and Diarrhea, improved some w Diclegis  Objective  BP 120/80   Wt (!) 320 lb (145.2 kg)   LMP 05/16/2020   BMI 62.50 kg/m  General: NAD Pumonary: no increased work of breathing Abdomen: gravid, non-tender Extremities: no edema Psychiatric: mood appropriate, affect full  Assessment  30 y.o. [id number] at [redacted]w[redacted]d by  02/20/2021, by Last Menstrual Period presenting for routine prenatal visit  Plan   Problem List Items Addressed This Visit      Other   Obesity complicating pregnancy in third trimester   Relevant Orders   AMB referral to maternal fetal medicine   [place] MFM OB COMP + 14 WK   Ambulatory referral to Anesthesiology    Other Visit Diagnoses    Supervision of high risk pregnancy in first trimester    -  Primary   Relevant Orders   AMB referral to maternal fetal medicine   [place] MFM OB COMP + 14 WK   [redacted] weeks gestation of pregnancy       Encounter for genetic screening for Down Syndrome       Relevant Orders   MaterniT21 PLUS Core+SCA      pregnancy3  Problems (from 05/14/20 to present)    Problem Noted Resolved   Obesity complicating pregnancy in third trimester 08/05/2020 by Nadara Mustard, MD No   Overview Signed 08/05/2020  9:55 AM by Nadara Mustard, MD    BMI >=40 [x ] early 1h gtt -  [x ] screen sleep apnea [x ] anesthesia consult (early and late if BMI > 45) [x ] u/s for dating [x ]  [x ] nutritional goals [x ] folic acid 1mg  [x ] bASA (>12 weeks) [ ]  consider nutrition consult [ ]  consider maternal EKG 1st trimester [x ] Growth u/s 28 [ ] , 32 [ ] , 36 weeks [ ]  [x ] NST/AFI weekly 34+ weeks (34[] ,35[] ,36[] , 37[] , 38[] , 39[] , 40[] ) [ ]  IOL by 41 weeks (scheduled, prn [] )       Supervision of high risk pregnancy, antepartum 07/08/2020 by , MD No   Overview Addendum 07/22/2020 10:44 AM by , MD     Nursing Staff Provider  Office Location  [facility] Dating  LMP and  Language  English Anatomy     Flu Vaccine   Genetic Screen  NIPS: TODAY  TDaP vaccine    Hgb A1C or  GTT Early :TODAY Third trimester :   Covid  vaccinated   LAB RESULTS   Rhogam  n/a Blood Type A/Positive/-- (05/03 1603)   Feeding Plan  Antibody Negative (05/03 1603)  Contraception  Rubella 3.47 (05/03 1603)  Circumcision  RPR Non Reactive (05/03 1603)   Pediatrician   HBsAg Negative (05/03 1603)   Support Person  HIV Non Reactive (05/03 1603)  Prenatal Classes  Varicella Immune    GBS  (For PCN allergy, check sensitivities)   BTL Consent     VBAC Consent  Pap  2022, NILm HPV+ (16/18 neg), Pap 01/2019, ASCUS, HPV neg   Obesity: Pregravid BMI: 62     Discussed anes consult for obesity  Plan Anat 02-17-2006 at MFM, scheduled  Glucola and NIPT today  Obesity RF discussed  02-17-2006, MD, 02-17-2006 Ob/Gyn, [facility] 08/05/2020  10:10 AM

## 2020-08-05 NOTE — Progress Notes (Signed)
ULTRASOUND REPORT  Location: [facility] Date of Service: 08/05/2020   Indications:dating Findings:  Mason Jim intrauterine pregnancy is visualized with a CRL consistent with [redacted]w[redacted]d gestation, giving an (U/S) EDD of 02/20/21. The (U/S) EDD is consistent with the clinically established EDD of 02/20/21.  FHR: 150 BPM CRL measurement: 48.2 mm Yolk sac is not visualized. Amnion: visualized and appears normal   Right Ovary is normal in appearance. Left Ovary is normal appearance. Corpus luteal cyst:  is not visualized Survey of the adnexa demonstrates no adnexal masses. There is no free peritoneal fluid in the cul de sac.  Impression: 1. [redacted]w[redacted]d Viable Singleton Intrauterine pregnancy by U/S. 2. (U/S) EDD is consistent with Clinically established EDD of 02/20/2021.  Recommendations: 1.Clinical correlation with the patient's History and Physical Exam. 2. Keep EDC  Letitia Libra, MD

## 2020-08-06 LAB — COMPREHENSIVE METABOLIC PANEL
ALT: 14 IU/L (ref 0–32)
AST: 12 IU/L (ref 0–40)
Albumin/Globulin Ratio: 1.3 (ref 1.2–2.2)
Albumin: 3.4 g/dL — ABNORMAL LOW (ref 3.8–4.8)
Alkaline Phosphatase: 50 IU/L (ref 44–121)
BUN/Creatinine Ratio: 8 — ABNORMAL LOW (ref 9–23)
BUN: 5 mg/dL — ABNORMAL LOW (ref 6–20)
Bilirubin Total: 0.3 mg/dL (ref 0.0–1.2)
CO2: 18 mmol/L — ABNORMAL LOW (ref 20–29)
Calcium: 9.1 mg/dL (ref 8.7–10.2)
Chloride: 102 mmol/L (ref 96–106)
Creatinine, Ser: 0.65 mg/dL (ref 0.57–1.00)
Globulin, Total: 2.7 g/dL (ref 1.5–4.5)
Glucose: 111 mg/dL — ABNORMAL HIGH (ref 65–99)
Potassium: 3.7 mmol/L (ref 3.5–5.2)
Sodium: 137 mmol/L (ref 134–144)
Total Protein: 6.1 g/dL (ref 6.0–8.5)
eGFR: 121 mL/min/{1.73_m2} (ref >59)

## 2020-08-06 LAB — HEMOGLOBIN A1C
Est. average glucose Bld gHb Est-mCnc: 111 mg/dL
Hgb A1c MFr Bld: 5.5 % (ref 4.8–5.6)

## 2020-08-06 LAB — GLUCOSE TOLERANCE, 1 HOUR: Glucose, 1Hr PP: 116 mg/dL (ref 65–199)

## 2020-08-10 LAB — MATERNIT21 PLUS CORE+SCA
Fetal Fraction: 7
Monosomy X (Turner Syndrome): NOT DETECTED
Result (T21): NEGATIVE
Trisomy 13 (Patau syndrome): NEGATIVE
Trisomy 18 (Edwards syndrome): NEGATIVE
Trisomy 21 (Down syndrome): NEGATIVE
XXX (Triple X Syndrome): NOT DETECTED
XXY (Klinefelter Syndrome): NOT DETECTED
XYY (Jacobs Syndrome): NOT DETECTED

## 2020-09-02 ENCOUNTER — Encounter
Admission: RE | Admit: 2020-09-02 | Discharge: 2020-09-02 | Disposition: A | Discharge status: Home / Self Care | Payer: Disability Insurance | Source: Ambulatory Visit | Attending: Anesthesiology | Admitting: Anesthesiology

## 2020-09-02 ENCOUNTER — Ambulatory Visit (INDEPENDENT_AMBULATORY_CARE_PROVIDER_SITE_OTHER): Payer: Medicaid Other | Admitting: Obstetrics & Gynecology

## 2020-09-02 ENCOUNTER — Encounter: Payer: Self-pay | Admitting: Obstetrics & Gynecology

## 2020-09-02 ENCOUNTER — Other Ambulatory Visit: Payer: Self-pay

## 2020-09-02 VITALS — BP 120/80 | Wt 318.0 lb

## 2020-09-02 DIAGNOSIS — O99213 Obesity complicating pregnancy, third trimester: Secondary | ICD-10-CM

## 2020-09-02 DIAGNOSIS — O0990 Supervision of high risk pregnancy, unspecified, unspecified trimester: Secondary | ICD-10-CM

## 2020-09-02 DIAGNOSIS — O0991 Supervision of high risk pregnancy, unspecified, first trimester: Secondary | ICD-10-CM

## 2020-09-02 DIAGNOSIS — Z3A15 15 weeks gestation of pregnancy: Secondary | ICD-10-CM

## 2020-09-02 LAB — POCT URINALYSIS DIPSTICK OB
Glucose, UA: NEGATIVE
POC,PROTEIN,UA: NEGATIVE

## 2020-09-02 NOTE — Addendum Note (Signed)
Addended by: Cornelius Moras D on: 09/02/2020 10:15 AM   Modules accepted: Orders

## 2020-09-02 NOTE — Consult Note (Signed)
[facility] Anesthesia Consultation  Alexis Gutierrez FKC:[id number] DOB: [birth year] DOA: 09/02/2020 PCP: Alexis Fabry, MD   Requesting physician: Dr. Tiburcio Pea Date of consultation: 09/02/20 Reason for consultation: Obesity during pregnancy  CHIEF COMPLAINT:  Obesity during pregnancy  HISTORY OF PRESENT ILLNESS: Alexis Gutierrez  is a 30 y.o. female with a known history of obesity during pregnancy. Two prior vaginal deliveries, had epidural analgesia for her last delivery that worked well. Denies hx of cardiovascular disease.   PAST MEDICAL HISTORY:   Past Medical History:  Diagnosis Date   Anxiety    Asthma    Depression    Supervision of high risk pregnancy, antepartum 12/27/2017   Clinic [facility] Prenatal Labs Dating 8wk5d ultrasound Blood type: --/--/A POS Performed at [facility], [address] [place], [place] [address]  [phone])  Genetic Screen  NIPS: Normal XY Antibody:Negative (10/10 1003) Anatomic [place] Complete at 23 weeks  Rubella: 3.70 (10/10 1003) Varicella: Immune GTT Early:  91             Third trimester: 127 RPR: Non Reactive (02/20 0954)  Rhoga    PAST SURGICAL HISTORY:  Past Surgical History:  Procedure Laterality Date   TONSILLECTOMY      SOCIAL HISTORY:  Social History   Tobacco Use   Smoking status: Former    Pack years: 0.00    Types: Cigarettes    Quit date: 06/11/2020    Years since quitting: 0.2   Smokeless tobacco: Never  Substance Use Topics   Alcohol use: No    FAMILY HISTORY:  Family History  Problem Relation Age of Onset   Breast cancer Paternal Aunt        16s    DRUG ALLERGIES:  Allergies  Allergen Reactions   Amoxicillin Shortness Of Breath    Has patient had a PCN reaction causing immediate rash, facial/tongue/throat swelling, SOB or lightheadedness with hypotension: Yes Has patient had a PCN reaction causing severe rash involving mucus membranes or skin necrosis: No Has patient  had a PCN reaction that required hospitalization: No Has patient had a PCN reaction occurring within the last 10 years: No If all of the above answers are "NO", then may proceed with Cephalosporin use.   Eggs Or Egg-Derived Products Other (See Comments)    Acid reflux   Penicillins Swelling    Has patient had a PCN reaction causing immediate rash, facial/tongue/throat swelling, SOB or lightheadedness with hypotension: Yes Has patient had a PCN reaction causing severe rash involving mucus membranes or skin necrosis: No Has patient had a PCN reaction that required hospitalization: No Has patient had a PCN reaction occurring within the last 10 years: No If all of the above answers are "NO", then may proceed with Cephalosporin use.    Tomato Other (See Comments)    Acid reflux     REVIEW OF SYSTEMS:   RESPIRATORY: No cough, shortness of breath, wheezing.  CARDIOVASCULAR: No chest pain, orthopnea, edema.  HEMATOLOGY: No anemia, easy bruising or bleeding SKIN: No rash or lesion. NEUROLOGIC: No tingling, numbness, weakness.  PSYCHIATRY: No anxiety or depression.   MEDICATIONS AT HOME:  Prior to Admission medications   Medication Sig Start Date End Date Taking? Authorizing Provider  Doxylamine-Pyridoxine (DICLEGIS) 10-10 MG TBEC Start with 2 tabs PO QHS, if needed on day 3, add 1 tab PO in AM, if needed on day 4, add a 4th tablet PO in afternoon 07/22/20   Conard Novak, MD  nitrofurantoin,  macrocrystal-monohydrate, (MACROBID) 100 MG capsule Take 1 capsule (100 mg total) by mouth 2 (two) times daily. 07/11/20   Schuman, Jaquelyn Bitter, MD  Prenatal Vit-Fe Fumarate-FA (MULTIVITAMIN-PRENATAL) 27-0.8 MG TABS tablet Take 1 tablet by mouth daily at 12 noon. 06/27/20 10/05/20  Federico Flake, MD      PHYSICAL EXAMINATION:   VITAL SIGNS: Last menstrual period 05/16/2020, unknown if currently breastfeeding.  GENERAL:  30 y.o.-year-old patient no acute distress.  HEENT: Head atraumatic,  normocephalic. Oropharynx and nasopharynx clear. MP 2, TM distance >3 cm, normal mouth opening. LUNGS: No use of accessory muscles of respiration.   EXTREMITIES: No pedal edema, cyanosis, or clubbing.  NEUROLOGIC: normal gait PSYCHIATRIC: The patient is alert and oriented x 3.  SKIN: No obvious rash, lesion, or ulcer.    IMPRESSION AND PLAN:   Alexis Gutierrez  is a 30 y.o. female presenting with obesity during pregnancy. BMI is currently 62 at [redacted] weeks gestation.   Airway exam reassuring today. Spinal interspaces minimally palpable.   We discussed analgesic options during labor including epidural analgesia. Discussed that in obesity there can be increased difficulty with epidural placement or even failure of successful epidural. We also discussed that even after successful epidural placement there is increased risk of catheter migration out of the epidural space that would require catheter replacement. Discussed use of epidural vs spinal vs GA if cesarean delivery is required. Discussed increased risk of difficult intubation during pregnancy should an emergency cesarean delivery be required.   We discussed repeat evaluation at 34-36 weeks by anesthesia to determine whether there is a high risk of complications of anesthesia for which we would recommend transfer of OB care to a facility with a higher maternal level of care designation.

## 2020-09-02 NOTE — Progress Notes (Signed)
  Subjective  Fetal Movement? yes Contractions? no Leaking Fluid? no Vaginal Bleeding? No Mild round lig pains  Objective  BP 120/80   Wt (!) 318 lb (144.2 kg)   LMP 05/16/2020   BMI 62.11 kg/m  General: NAD Pumonary: no increased work of breathing Abdomen: gravid, non-tender Extremities: no edema Psychiatric: mood appropriate, affect full  Assessment  30 y.o. 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  Other Visit Diagnoses    Supervision of high risk pregnancy in first trimester    -  Primary   [redacted] weeks gestation of pregnancy        Anes consult is today MFM consult and [place] is Thurs Glucola was normal  pregnancy3  Problems (from 05/14/20 to present)    Problem Noted Resolved   Obesity complicating pregnancy in third trimester     Overview Signed 08/05/2020  9:55 AM by Nadara Mustard, MD    BMI >=40 [x ] early 1h gtt -  [x ] screen sleep apnea [x ] anesthesia consult (early and late if BMI > 45) [x ] u/s for dating [x ]  [x ] nutritional goals [x ] folic acid 1mg  [x ] bASA (>12 weeks) [ ]  consider nutrition consult [ ]  consider maternal EKG 1st trimester [x ] Growth u/s 28 [ ] , 32 [ ] , 36 weeks [ ]  [x ] NST/AFI weekly 34+ weeks (34[] ,35[] ,36[] , 37[] , 38[] , 39[] , 40[] ) [ ]  IOL by 41 weeks (scheduled, prn [] )        Supervision of high risk pregnancy, antepartum     Overview Addendum 07/22/2020 10:44 AM by , MD     Nursing Staff Provider  Office Location  [facility] Dating   Language  English Anatomy    Flu Vaccine   Genetic Screen  NIPS: nml XX  TDaP vaccine    Hgb A1C or  GTT Early : Third trimester :   Covid  vaccinated   LAB RESULTS   Rhogam   Blood Type A/Positive/-- (05/03 1603)   Feeding Plan  Antibody Negative (05/03 1603)  Contraception  Rubella 3.47 (05/03  1603)  Circumcision  RPR Non Reactive (05/03 1603)   Pediatrician   HBsAg Negative (05/03 1603)   Support Person  HIV Non Reactive (05/03 1603)  Prenatal Classes  Varicella Immune    GBS  (For PCN allergy, check sensitivities)   BTL Consent     VBAC Consent  Pap  2022, NILm HPV+ (16/18 neg), Pap 01/2019, ASCUS, HPV neg    Hgb Electro      CF      SMA     Hep C neg   Obesity: Pregravid BMI: 62         02-17-2006, MD, FACOG [facility], [facility] 09/02/2020  9:15 AM

## 2020-09-04 ENCOUNTER — Ambulatory Visit: Payer: Medicaid Other | Attending: Obstetrics

## 2020-09-04 ENCOUNTER — Other Ambulatory Visit: Payer: Self-pay

## 2020-09-04 ENCOUNTER — Ambulatory Visit: Payer: Medicaid Other

## 2020-09-04 DIAGNOSIS — O0991 Supervision of high risk pregnancy, unspecified, first trimester: Secondary | ICD-10-CM | POA: Diagnosis not present

## 2020-09-04 DIAGNOSIS — O99212 Obesity complicating pregnancy, second trimester: Secondary | ICD-10-CM | POA: Diagnosis not present

## 2020-09-04 DIAGNOSIS — E669 Obesity, unspecified: Secondary | ICD-10-CM

## 2020-09-04 DIAGNOSIS — O99213 Obesity complicating pregnancy, third trimester: Secondary | ICD-10-CM | POA: Diagnosis not present

## 2020-09-04 DIAGNOSIS — Z3A15 15 weeks gestation of pregnancy: Secondary | ICD-10-CM | POA: Insufficient documentation

## 2020-09-04 NOTE — Progress Notes (Unsigned)
MFM Note  This patient was seen for an early ultrasound due to maternal obesity with a BMI of 62.1.  The patient reports two prior full-term uncomplicated vaginal deliveries.  She denies any significant past medical history and denies any history of hypertension or diabetes.  She does not have any significant past surgical history.  She reports that she had a cell free DNA test drawn earlier in her pregnancy that showed a low risk for trisomy 59, 88, and 13.  A female fetus is predicted.  She was informed that the fetal growth and amniotic fluid level appears appropriate for her gestational age.  The fetal biometry measurements obtained today are consistent with an [facility] of February 20, 2021.  The views of the fetal anatomy were limited today due to extreme maternal body habitus and her early gestational age.  The patient was advised that maternal obesity increases the risk of adverse pregnancy outcomes such as miscarriages/stillbirth, congenital anomalies such as neural tube defects/spina bifida, and may increase the risk of preeclampsia and gestational diabetes requiring an indicated preterm birth.  The recommended total weight gain in pregnancy for obese women's between 10 to 20 pounds.  Due to obesity, an early screen for gestational diabetes at between 15 to 16 weeks is recommended.  If the initial early diabetes screening result is negative, a repeat diabetes screen is recommended again at 24 to 28 weeks.  Due to maternal obesity, a detailed fetal anatomy scan has been scheduled for her at around 19 weeks to screen for fetal anomalies.  We will then continue to follow her with monthly growth ultrasounds.  Due to the increased risk of stillbirth in obese women with a BMI of greater than 40, weekly antenatal fetal surveillance may be considered beginning at 34 weeks.  As maternal obesity may present challenges associated with the management of anesthesia, an anesthesia consult should be obtained  when she is admitted in labor.  As African-American women with a BMI of greater than 30 may be at increased risk for developing preeclampsia, she should start taking a daily baby aspirin (81 mg daily) as soon as possible and continue the daily baby aspirin for the duration of her pregnancy.  A detailed fetal anatomy scan has been scheduled for her in our office in 4 weeks.    A total of 20 minutes was spent counseling and coordinating the care for this patient.  Greater than 50% of the time was spent in direct face-to-face contact.

## 2020-09-17 ENCOUNTER — Telehealth: Payer: Self-pay

## 2020-09-17 NOTE — Telephone Encounter (Signed)
Pt calling; wants antibx for yeast inf called in.  [phone]  Adv monistat; pt states she has tried it and it hasn't worked.  Pharmacy is correct in chart.

## 2020-09-18 ENCOUNTER — Other Ambulatory Visit: Payer: Self-pay | Admitting: Obstetrics

## 2020-09-18 MED ORDER — FLUCONAZOLE 150 MG PO TABS: 150.0000 mg | ORAL_TABLET | Freq: Once | ORAL | 0 refills | Status: AC

## 2020-09-18 NOTE — Telephone Encounter (Signed)
LMTC

## 2020-09-18 NOTE — Telephone Encounter (Signed)
Pt aware.

## 2020-09-30 ENCOUNTER — Other Ambulatory Visit: Payer: Self-pay

## 2020-09-30 ENCOUNTER — Ambulatory Visit (INDEPENDENT_AMBULATORY_CARE_PROVIDER_SITE_OTHER): Payer: Medicaid Other | Admitting: Obstetrics

## 2020-09-30 VITALS — BP 120/72 | Ht 60.0 in | Wt 326.0 lb

## 2020-09-30 DIAGNOSIS — Z3201 Encounter for pregnancy test, result positive: Secondary | ICD-10-CM

## 2020-09-30 DIAGNOSIS — Z3A19 19 weeks gestation of pregnancy: Secondary | ICD-10-CM

## 2020-09-30 DIAGNOSIS — O0990 Supervision of high risk pregnancy, unspecified, unspecified trimester: Secondary | ICD-10-CM

## 2020-09-30 LAB — POCT URINALYSIS DIPSTICK OB
Glucose, UA: NEGATIVE
POC,PROTEIN,UA: NEGATIVE

## 2020-09-30 MED ORDER — PRENATAL 27-0.8 MG PO TABS: 1.0000 | ORAL_TABLET | Freq: Every day | ORAL | 0 refills | Status: DC

## 2020-09-30 NOTE — Addendum Note (Signed)
Addended by: Clement Husbands A on: 09/30/2020 09:37 AM   Modules accepted: Orders

## 2020-09-30 NOTE — Progress Notes (Signed)
Routine Prenatal Care Visit  Subjective  Alexis Gutierrez is a 30 y.o. G3P2002 at [redacted]w[redacted]d being seen today for ongoing prenatal care.  She is currently monitored for the following issues for this high-risk pregnancy and has Morbid obesity with BMI of 50.0-59.9, adult (HCC); Ovarian cyst affecting pregnancy in first trimester, antepartum; History of learning disability; Pelvic pain affecting pregnancy; Hives; Supervision of high risk pregnancy, antepartum; and Obesity complicating pregnancy in third trimester on their problem list.  ----------------------------------------------------------------------------------- Patient reports no complaints.   Contractions: Not present. Vag. Bleeding: None.   . Leaking Fluid denies.  ----------------------------------------------------------------------------------- The following portions of the patient's history were reviewed and updated as appropriate: allergies, current medications, past family history, past medical history, past social history, past surgical history and problem list. Problem list updated.  Objective  Last menstrual period 05/16/2020, unknown if currently breastfeeding. Pregravid weight 320 lb (145.2 kg) Total Weight Gain 6 lb (2.722 kg) Urinalysis: Urine Protein    Urine Glucose    Fetal Status: Fetal Heart Rate (bpm): 140s         General:  Alert, oriented and cooperative. Patient is in no acute distress.  Skin: Skin is warm and dry. No rash noted.   Cardiovascular: Normal heart rate noted  Respiratory: Normal respiratory effort, no problems with respiration noted  Abdomen: Soft, gravid, appropriate for gestational age. Pain/Pressure: Absent     Pelvic:  Cervical exam deferred        Extremities: Normal range of motion.  Edema: None  Mental Status: Normal mood and affect. Normal behavior. Normal judgment and thought content.   Assessment   30 y.o. [id number] at [redacted]w[redacted]d by  02/20/2021, by Last Menstrual Period presenting for routine  prenatal visit  Plan   pregnancy3  Problems (from 05/14/20 to present)     Problem Noted Resolved   Obesity complicating pregnancy in third trimester 08/05/2020 by Nadara Mustard, MD No   Overview Signed 08/05/2020  9:55 AM by Nadara Mustard, MD    BMI >=40 [x ] early 1h gtt -  [x ] screen sleep apnea [x ] anesthesia consult (early and late if BMI > 45) [x ] u/s for dating [x ]  [x ] nutritional goals [x ] folic acid 1mg  [x ] bASA (>12 weeks) [ ]  consider nutrition consult [ ]  consider maternal EKG 1st trimester [x ] Growth u/s 28 [ ] , 32 [ ] , 36 weeks [ ]  [x ] NST/AFI weekly 34+ weeks (34[] ,35[] ,36[] , 37[] , 38[] , 39[] , 40[] ) [ ]  IOL by 41 weeks (scheduled, prn [] )       Supervision of high risk pregnancy, antepartum 07/08/2020 by , MD No   Overview Addendum 09/02/2020  9:17 AM by , MD     Nursing Staff Provider  Office Location  [facility] Dating   Language  English Anatomy  MFM  Flu Vaccine   Genetic Screen  NIPS:nml XX   TDaP vaccine    Hgb A1C or  GTT Early :116 Third trimester :   Covid  vaccinated   LAB RESULTS   Rhogam   Blood Type A/Positive/-- (05/03 1603)   Feeding Plan  Antibody Negative (05/03 1603)  Contraception  Rubella 3.47 (05/03 1603)  Circumcision  RPR Non Reactive (05/03 1603)   Pediatrician   HBsAg Negative (05/03 1603)   Support Person  HIV Non Reactive (05/03 1603)  Prenatal Classes  Varicella Immune    GBS  (For PCN allergy, check sensitivities)  BTL Consent     VBAC Consent  Pap  2022, NILm HPV+ (16/18 neg), Pap 01/2019, ASCUS, HPV neg    Hgb Electro      CF      SMA     Hep C neg   Obesity: Pregravid BMI: 62            Preterm labor symptoms and general obstetric precautions including but not limited to vaginal bleeding, contractions, leaking of fluid and fetal movement were reviewed in detail with the patient. Please refer to After Visit Summary for other counseling recommendations.   Discussed starting on daily ASA. She has a repeat sono set up with MFM. Samples of PNVs provided today.  Return in about 3 weeks (around 10/21/2020) for return OB, MD visit please (High Risk).  Mirna Mires, CNM  09/30/2020 9:18 AM

## 2020-10-02 ENCOUNTER — Other Ambulatory Visit: Payer: Self-pay | Admitting: Obstetrics

## 2020-10-02 DIAGNOSIS — O99212 Obesity complicating pregnancy, second trimester: Secondary | ICD-10-CM

## 2020-10-02 DIAGNOSIS — Z3A20 20 weeks gestation of pregnancy: Secondary | ICD-10-CM

## 2020-10-02 DIAGNOSIS — O4442 Low lying placenta NOS or without hemorrhage, second trimester: Secondary | ICD-10-CM

## 2020-10-07 ENCOUNTER — Other Ambulatory Visit: Payer: Self-pay

## 2020-10-07 ENCOUNTER — Ambulatory Visit: Payer: Medicaid Other | Attending: Obstetrics and Gynecology

## 2020-10-07 VITALS — BP 113/67 | HR 89 | Temp 98.3°F | Resp 20 | Ht 60.0 in | Wt 317.0 lb

## 2020-10-07 DIAGNOSIS — Z363 Encounter for antenatal screening for malformations: Secondary | ICD-10-CM | POA: Diagnosis not present

## 2020-10-07 DIAGNOSIS — O0990 Supervision of high risk pregnancy, unspecified, unspecified trimester: Secondary | ICD-10-CM

## 2020-10-07 DIAGNOSIS — O99212 Obesity complicating pregnancy, second trimester: Secondary | ICD-10-CM | POA: Insufficient documentation

## 2020-10-07 DIAGNOSIS — O99213 Obesity complicating pregnancy, third trimester: Secondary | ICD-10-CM

## 2020-10-07 DIAGNOSIS — Z3A20 20 weeks gestation of pregnancy: Secondary | ICD-10-CM | POA: Diagnosis not present

## 2020-10-07 DIAGNOSIS — O4442 Low lying placenta NOS or without hemorrhage, second trimester: Secondary | ICD-10-CM

## 2020-10-17 ENCOUNTER — Telehealth: Payer: Self-pay

## 2020-10-17 DIAGNOSIS — Z3201 Encounter for pregnancy test, result positive: Secondary | ICD-10-CM

## 2020-10-17 NOTE — Telephone Encounter (Signed)
Patient states her PNV Rx was not recevied by pharmacy. Please send to [facility] [address] pharmacy on file. She is almost out. TS#[phone]

## 2020-10-20 MED ORDER — PRENATAL 27-0.8 MG PO TABS: 1.0000 | ORAL_TABLET | Freq: Every day | ORAL | 0 refills | Status: DC

## 2020-10-20 NOTE — Telephone Encounter (Signed)
Pt calling; needs more PNV; is out and it's causing her stomach to hurt; rx sent to wrong pharm.  [phone]  Left detailed msg rx will be sent to [facility].

## 2020-10-21 ENCOUNTER — Telehealth: Payer: Self-pay

## 2020-10-21 ENCOUNTER — Other Ambulatory Visit: Payer: Self-pay | Admitting: Obstetrics and Gynecology

## 2020-10-21 ENCOUNTER — Ambulatory Visit (INDEPENDENT_AMBULATORY_CARE_PROVIDER_SITE_OTHER): Payer: Medicaid Other | Admitting: Obstetrics and Gynecology

## 2020-10-21 ENCOUNTER — Other Ambulatory Visit: Payer: Self-pay

## 2020-10-21 VITALS — BP 118/74 | Wt 318.0 lb

## 2020-10-21 DIAGNOSIS — Z113 Encounter for screening for infections with a predominantly sexual mode of transmission: Secondary | ICD-10-CM

## 2020-10-21 DIAGNOSIS — O0990 Supervision of high risk pregnancy, unspecified, unspecified trimester: Secondary | ICD-10-CM

## 2020-10-21 DIAGNOSIS — Z369 Encounter for antenatal screening, unspecified: Secondary | ICD-10-CM

## 2020-10-21 DIAGNOSIS — Z3A22 22 weeks gestation of pregnancy: Secondary | ICD-10-CM

## 2020-10-21 DIAGNOSIS — O99213 Obesity complicating pregnancy, third trimester: Secondary | ICD-10-CM

## 2020-10-21 LAB — POCT URINALYSIS DIPSTICK OB
Glucose, UA: NEGATIVE
POC,PROTEIN,UA: NEGATIVE

## 2020-10-21 MED ORDER — CONCEPT DHA 53.5-38-1 MG PO CAPS: 1.0000 | ORAL_CAPSULE | Freq: Every day | ORAL | 11 refills | Status: DC

## 2020-10-21 NOTE — Progress Notes (Signed)
Routine Prenatal Care Visit  Subjective  Alexis Gutierrez is a 30 y.o. G3P2002 at [redacted]w[redacted]d being seen today for ongoing prenatal care.  She is currently monitored for the following issues for this high-risk pregnancy and has Morbid obesity with BMI of 50.0-59.9, adult (HCC); Ovarian cyst affecting pregnancy in first trimester, antepartum; History of learning disability; Pelvic pain affecting pregnancy; Hives; Supervision of high risk pregnancy, antepartum; and Obesity complicating pregnancy in third trimester on their problem list.  ----------------------------------------------------------------------------------- Patient reports no complaints.   Contractions: Not present. Vag. Bleeding: None.  Movement: Present. Denies leaking of fluid.  ----------------------------------------------------------------------------------- The following portions of the patient's history were reviewed and updated as appropriate: allergies, current medications, past family history, past medical history, past social history, past surgical history and problem list. Problem list updated.   Objective  Blood pressure 118/74, weight (!) 318 lb (144.2 kg), last menstrual period 05/16/2020, unknown if currently breastfeeding. Pregravid weight 320 lb (145.2 kg) Total Weight Gain -2 lb (-0.907 kg) Urinalysis:      Fetal Status: Fetal Heart Rate (bpm): 145   Movement: Present     General:  Alert, oriented and cooperative. Patient is in no acute distress.  Skin: Skin is warm and dry. No rash noted.   Cardiovascular: Normal heart rate noted  Respiratory: Normal respiratory effort, no problems with respiration noted  Abdomen: Soft, gravid, appropriate for gestational age. Pain/Pressure: Absent     Pelvic:  Cervical exam deferred        Extremities: Normal range of motion.     ental Status: Normal mood and affect. Normal behavior. Normal judgment and thought content.     Assessment   30 y.o. G3P2002 at [redacted]w[redacted]d by   02/20/2021, by Last Menstrual Period presenting for routine prenatal visit  Plan   pregnancy3  Problems (from 05/14/20 to present)     Problem Noted Resolved   Obesity complicating pregnancy in third trimester 08/05/2020 by Nadara Mustard, MD No   Overview Signed 08/05/2020  9:55 AM by Nadara Mustard, MD    BMI >=40 [x ] early 1h gtt -  [x ] screen sleep apnea [x ] anesthesia consult (early and late if BMI > 45) [x ] u/s for dating [x ]  [x ] nutritional goals [x ] folic acid 1mg  [x ] bASA (>12 weeks) [ ]  consider nutrition consult [ ]  consider maternal EKG 1st trimester [x ] Growth u/s 28 [ ] , 32 [ ] , 36 weeks [ ]  [x ] NST/AFI weekly 34+ weeks (34[] ,35[] ,36[] , 37[] , 38[] , 39[] , 40[] ) [ ]  IOL by 41 weeks (scheduled, prn [] )       Supervision of high risk pregnancy, antepartum 07/08/2020 by , MD No   Overview Addendum 09/02/2020  9:17 AM by , MD     Nursing Staff Provider  Office Location  [facility] Dating   Language  English Anatomy  MFM  Flu Vaccine   Genetic Screen  NIPS:nml XX   TDaP vaccine    Hgb A1C or  GTT Early :116 Third trimester :   Covid  vaccinated   LAB RESULTS   Rhogam   Blood Type A/Positive/-- (05/03 1603)   Feeding Plan  Antibody Negative (05/03 1603)  Contraception  Rubella 3.47 (05/03 1603)  Circumcision  RPR Non Reactive (05/03 1603)   Pediatrician   HBsAg Negative (05/03 1603)   Support Person  HIV Non Reactive (05/03 1603)  Prenatal Classes  Varicella Immune  GBS  (For PCN allergy, check sensitivities)   BTL Consent     VBAC Consent  Pap  2022, NILm HPV+ (16/18 neg), Pap 01/2019, ASCUS, HPV neg    Hgb Electro      CF      SMA     Hep C neg  Obesity: Pregravid BMI: 49            Gestational age appropriate obstetric precautions including but not limited to vaginal bleeding, contractions, leaking of fluid and fetal movement were reviewed in detail with the patient.    Return in about 4 weeks  (around 11/18/2020) for 4 week ROB, and 6 week ROB and 28 week labs.  Vena Austria, MD, [facility], [facility] 10/21/2020, 9:27 AM

## 2020-10-21 NOTE — Telephone Encounter (Signed)
New rx sent

## 2020-10-21 NOTE — Progress Notes (Signed)
ROB - no concerns. RM 4 °

## 2020-10-21 NOTE — Telephone Encounter (Signed)
Pt calling; the pnv that were sent in yesterday are otc and m'caid will not pay for them; please rx pnv that m'caid will pay for as she doesn't have the money to pay out of pocket.  [phone]

## 2020-10-22 ENCOUNTER — Other Ambulatory Visit: Payer: Self-pay | Admitting: Obstetrics and Gynecology

## 2020-10-22 NOTE — Telephone Encounter (Signed)
Left detailed msg AMS rx'd new rx for Concept DHA.

## 2020-10-29 NOTE — Telephone Encounter (Signed)
Spoke w/patient. Advised Prenatal gummies ok to take.

## 2020-10-29 NOTE — Telephone Encounter (Signed)
She has had 2 different rx's sent for pnv. Neither are approved by medicaid. Inquiring if it is ok to take prenatal gummies w/DHA & Folic Acid. XB#[phone]

## 2020-11-04 ENCOUNTER — Other Ambulatory Visit: Payer: Self-pay | Admitting: Obstetrics and Gynecology

## 2020-11-04 DIAGNOSIS — O99212 Obesity complicating pregnancy, second trimester: Secondary | ICD-10-CM

## 2020-11-06 ENCOUNTER — Other Ambulatory Visit: Payer: Self-pay

## 2020-11-06 ENCOUNTER — Ambulatory Visit: Payer: Medicaid Other | Attending: Obstetrics

## 2020-11-06 VITALS — BP 127/68 | HR 87 | Temp 98.8°F | Resp 17 | Ht 60.0 in | Wt 319.0 lb

## 2020-11-06 DIAGNOSIS — E669 Obesity, unspecified: Secondary | ICD-10-CM | POA: Diagnosis not present

## 2020-11-06 DIAGNOSIS — O99212 Obesity complicating pregnancy, second trimester: Secondary | ICD-10-CM | POA: Diagnosis present

## 2020-11-06 DIAGNOSIS — Z3A24 24 weeks gestation of pregnancy: Secondary | ICD-10-CM | POA: Insufficient documentation

## 2020-11-06 DIAGNOSIS — O99213 Obesity complicating pregnancy, third trimester: Secondary | ICD-10-CM

## 2020-11-06 DIAGNOSIS — O0990 Supervision of high risk pregnancy, unspecified, unspecified trimester: Secondary | ICD-10-CM

## 2020-11-17 ENCOUNTER — Encounter: Payer: Self-pay | Admitting: Obstetrics and Gynecology

## 2020-11-17 ENCOUNTER — Ambulatory Visit (INDEPENDENT_AMBULATORY_CARE_PROVIDER_SITE_OTHER): Payer: Medicaid Other | Admitting: Obstetrics and Gynecology

## 2020-11-17 ENCOUNTER — Other Ambulatory Visit: Payer: Self-pay

## 2020-11-17 VITALS — BP 128/70 | Ht 60.0 in | Wt 316.2 lb

## 2020-11-17 DIAGNOSIS — Z3A26 26 weeks gestation of pregnancy: Secondary | ICD-10-CM

## 2020-11-17 DIAGNOSIS — O99213 Obesity complicating pregnancy, third trimester: Secondary | ICD-10-CM

## 2020-11-17 DIAGNOSIS — O0990 Supervision of high risk pregnancy, unspecified, unspecified trimester: Secondary | ICD-10-CM

## 2020-11-17 LAB — POCT URINALYSIS DIPSTICK OB
Glucose, UA: NEGATIVE
POC,PROTEIN,UA: NEGATIVE

## 2020-11-17 MED ORDER — VITAFOL FE+ 90-0.6-0.4-200 MG PO CAPS: 1.0000 | ORAL_CAPSULE | Freq: Every day | ORAL | 11 refills | Status: DC

## 2020-11-17 NOTE — Patient Instructions (Signed)

## 2020-11-17 NOTE — Progress Notes (Signed)
Routine Prenatal Care Visit  Subjective  Alexis Gutierrez is a 30 y.o. G3P2002 at [redacted]w[redacted]d being seen today for ongoing prenatal care.  She is currently monitored for the following issues for this high-risk pregnancy and has Morbid obesity with BMI of 50.0-59.9, adult (HCC); Ovarian cyst affecting pregnancy in first trimester, antepartum; History of learning disability; Pelvic pain affecting pregnancy; Hives; Supervision of high risk pregnancy, antepartum; and Obesity complicating pregnancy in third trimester on their problem list.  ----------------------------------------------------------------------------------- Patient reports no complaints.  She reports her current PNV is upsetting herstomach. Prior rx for PNV was not covered by insurance. Contractions: Not present. Vag. Bleeding: None.  Movement: Present. Denies leaking of fluid.  ----------------------------------------------------------------------------------- The following portions of the patient's history were reviewed and updated as appropriate: allergies, current medications, past family history, past medical history, past social history, past surgical history and problem list. Problem list updated.   Objective  Blood pressure 128/70, height 5' (1.524 m), weight (!) 316 lb 3.2 oz (143.4 kg), last menstrual period 05/16/2020, unknown if currently breastfeeding. Pregravid weight 320 lb (145.2 kg) Total Weight Gain -3 lb 12.8 oz (-1.724 kg) Urinalysis:      Fetal Status: Fetal Heart Rate (bpm): 145   Movement: Present     General:  Alert, oriented and cooperative. Patient is in no acute distress.  Skin: Skin is warm and dry. No rash noted.   Cardiovascular: Normal heart rate noted  Respiratory: Normal respiratory effort, no problems with respiration noted  Abdomen: Soft, gravid, appropriate for gestational age. Pain/Pressure: Absent     Pelvic:  Cervical exam deferred        Extremities: Normal range of motion.  Edema: None   Mental Status: Normal mood and affect. Normal behavior. Normal judgment and thought content.     Assessment   30 y.o. G3P2002 at [redacted]w[redacted]d by  02/20/2021, by Last Menstrual Period presenting for routine prenatal visit  Plan   pregnancy3  Problems (from 05/14/20 to present)     Problem Noted Resolved   Obesity complicating pregnancy in third trimester 08/05/2020 by Nadara Mustard, MD No   Overview Signed 08/05/2020  9:55 AM by Nadara Mustard, MD    BMI >=40 [x ] early 1h gtt -  [x ] screen sleep apnea [x ] anesthesia consult (early and late if BMI > 45) [x ] u/s for dating [x ]  [x ] nutritional goals [x ] folic acid 1mg  [x ] bASA (>12 weeks) [ ]  consider nutrition consult [ ]  consider maternal EKG 1st trimester [x ] Growth u/s 28 [ ] , 32 [ ] , 36 weeks [ ]  [x ] NST/AFI weekly 34+ weeks (34[] ,35[] ,36[] , 37[] , 38[] , 39[] , 40[] ) [ ]  IOL by 41 weeks (scheduled, prn [] )       Supervision of high risk pregnancy, antepartum 07/08/2020 by , MD No   Overview Addendum 11/17/2020 10:20 AM by , MD     Nursing Staff Provider  Office Location  [facility] Dating  LMP  Language  English Anatomy  MFM- complete, normal  Flu Vaccine   Genetic Screen  NIPS:nml XX   TDaP vaccine    Hgb A1C or  GTT Early :116 Third trimester :   Covid  vaccinated   LAB RESULTS   Rhogam  Not needed Blood Type A/Positive/-- (05/03 1603)   Feeding Plan Breast/bottle Antibody Negative (05/03 1603)  Contraception IUD Rubella 3.47 (05/03 1603)  Circumcision  RPR Non Reactive (05/03 1603)  Pediatrician   HBsAg Negative (05/03 1603)   Support Person  HIV Non Reactive (05/03 1603)  Prenatal Classes  Varicella Immune    GBS  (For PCN allergy, check sensitivities)   BTL Consent     VBAC Consent  Pap  2022, NILm HPV+ (16/18 neg), Pap 01/2019, ASCUS, HPV neg    Hgb Electro      CF      SMA     Hep C neg  Obesity: Pregravid BMI: 62          New PNV rx sent.    Gestational age appropriate obstetric precautions including but not limited to vaginal bleeding, contractions, leaking of fluid and fetal movement were reviewed in detail with the patient.    Return in about 2 weeks (around 12/01/2020) for ROB and 1 GTT as planned.  Natale Milch MD [facility], [facility] 11/17/2020, 10:23 AM

## 2020-11-17 NOTE — Addendum Note (Signed)
Addended by: Clement Husbands A on: 11/17/2020 05:12 PM   Modules accepted: Orders

## 2020-11-28 ENCOUNTER — Other Ambulatory Visit: Payer: Self-pay | Admitting: Obstetrics

## 2020-11-28 DIAGNOSIS — O99213 Obesity complicating pregnancy, third trimester: Secondary | ICD-10-CM

## 2020-12-01 ENCOUNTER — Other Ambulatory Visit: Payer: Self-pay

## 2020-12-01 ENCOUNTER — Other Ambulatory Visit: Payer: Medicaid Other

## 2020-12-01 ENCOUNTER — Ambulatory Visit (INDEPENDENT_AMBULATORY_CARE_PROVIDER_SITE_OTHER): Payer: Medicaid Other | Admitting: Obstetrics and Gynecology

## 2020-12-01 VITALS — BP 128/76 | Wt 316.0 lb

## 2020-12-01 DIAGNOSIS — O99213 Obesity complicating pregnancy, third trimester: Secondary | ICD-10-CM

## 2020-12-01 DIAGNOSIS — Z3A28 28 weeks gestation of pregnancy: Secondary | ICD-10-CM

## 2020-12-01 DIAGNOSIS — Z369 Encounter for antenatal screening, unspecified: Secondary | ICD-10-CM

## 2020-12-01 DIAGNOSIS — O0990 Supervision of high risk pregnancy, unspecified, unspecified trimester: Secondary | ICD-10-CM

## 2020-12-01 DIAGNOSIS — Z113 Encounter for screening for infections with a predominantly sexual mode of transmission: Secondary | ICD-10-CM

## 2020-12-01 LAB — POCT URINALYSIS DIPSTICK OB
Glucose, UA: NEGATIVE
POC,PROTEIN,UA: NEGATIVE

## 2020-12-01 NOTE — Progress Notes (Signed)
ROB - 1hr gtt, no concerns. RM 4 

## 2020-12-01 NOTE — Progress Notes (Signed)
Routine Prenatal Care Visit  Subjective  Alexis GRONDAHL is a 30 y.o. G3P2002 at [redacted]w[redacted]d being seen today for ongoing prenatal care.  She is currently monitored for the following issues for this high-risk pregnancy and has Morbid obesity with BMI of 50.0-59.9, adult (HCC); Ovarian cyst affecting pregnancy in first trimester, antepartum; History of learning disability; Pelvic pain affecting pregnancy; Hives; Supervision of high risk pregnancy, antepartum; and Obesity complicating pregnancy in third trimester on their problem list.  ----------------------------------------------------------------------------------- Patient reports no complaints.   Contractions: Not present. Vag. Bleeding: None.  Movement: Present. Denies leaking of fluid.  ----------------------------------------------------------------------------------- The following portions of the patient's history were reviewed and updated as appropriate: allergies, current medications, past family history, past medical history, past social history, past surgical history and problem list. Problem list updated.   Objective  Blood pressure 128/76, weight (!) 316 lb (143.3 kg), last menstrual period 05/16/2020, unknown if currently breastfeeding. Pregravid weight 320 lb (145.2 kg) Total Weight Gain -4 lb (-1.814 kg) Urinalysis:      Fetal Status: Fetal Heart Rate (bpm): 140   Movement: Present     General:  Alert, oriented and cooperative. Patient is in no acute distress.  Skin: Skin is warm and dry. No rash noted.   Cardiovascular: Normal heart rate noted  Respiratory: Normal respiratory effort, no problems with respiration noted  Abdomen: Soft, gravid, appropriate for gestational age. Pain/Pressure: Absent     Pelvic:  Cervical exam deferred        Extremities: Normal range of motion.     ental Status: Normal mood and affect. Normal behavior. Normal judgment and thought content.   [place] MFM OB FOLLOW UP  Result Date:  11/06/2020 ----------------------------------------------------------------------  OBSTETRICS REPORT                       (Signed Final 11/06/2020 05:02 pm) ---------------------------------------------------------------------- Patient Info  ID #:       [id number]                          D.O.B.:  08-Sep-1990 (29 yrs)  Name:       Alexis Gutierrez                  Visit Date: 11/06/2020 04:52 pm ---------------------------------------------------------------------- Performed By  Attending:        Ma Rings MD         Ref. Address:     [address] [place],                                                             [place] [address]  Performed By:     Lorn Junes,           Location:         [facility]                               [facility] at                                                             [facility]  Referred By:      Nadara Mustard MD ---------------------------------------------------------------------- Orders  #  Description                           Code        Ordered By  1  [place] MFM OB FOLLOW UP                   39030.09    Noralee Space ----------------------------------------------------------------------  #  Order #                     Accession #                Episode #  1  [id number]                   [id number]                 [id number] ---------------------------------------------------------------------- Indications  Obesity complicating pregnancy, second         O99.212  trimester  [redacted] weeks gestation of pregnancy                Z3A.24  Encounter for antenatal screening for          Z36.3  malformations ---------------------------------------------------------------------- Fetal Evaluation  Num Of Fetuses:         1  Fetal Heart Rate(bpm):  148  Cardiac Activity:       Observed  Presentation:           Cephalic  Placenta:               Posterior  Amniotic Fluid  AFI  FV:      Within normal limits                              Largest Pocket(cm)                              6.3 ---------------------------------------------------------------------- Biometry  BPD:      60.6  mm     G. Age:  24w 5d         35  %    CI:        71.72   %    70 - 86                                                          FL/HC:      19.1   %  18.7 - 20.3  HC:      227.8  mm     G. Age:  24w 6d         27  %    HC/AC:      1.10        1.04 - 1.22  AC:      206.4  mm     G. Age:  25w 2d         53  %    FL/BPD:     71.8   %    71 - 87  FL:       43.5  mm     G. Age:  24w 2d         20  %    FL/AC:      21.1   %    20 - 24  HUM:      39.5  mm     G. Age:  24w 1d         25  %  CER:      28.8  mm     G. Age:  25w 3d         69  %  LV:        5.5  mm  CM:        4.8  mm  Est. FW:     736  gm    1 lb 10 oz      37  % ---------------------------------------------------------------------- OB History  Gravidity:    3         Term:   2 ---------------------------------------------------------------------- Gestational Age  LMP:           24w 6d        Date:  05/16/20                 EDD:   02/20/21  U/S Today:     24w 6d                                        EDD:   02/20/21  Best:          24w 6d     Det. By:  LMP  (05/16/20)          EDD:   02/20/21 ---------------------------------------------------------------------- Anatomy  Cranium:               Appears normal         Aortic Arch:            Previously seen  Cavum:                 Appears normal         Ductal Arch:            Previously seen  Ventricles:            Appears normal         Diaphragm:              Previously seen  Choroid Plexus:        Appears normal         Stomach:                Appears normal, left  sided  Cerebellum:            Appears normal         Abdomen:                Appears normal  Posterior Fossa:       Appears normal         Abdominal Wall:          Previously seen  Nuchal Fold:           Not applicable (>20    Cord Vessels:           Appears normal ([redacted]                         wks GA)                                        vessel cord)  Face:                  Appears normal         Kidneys:                Appear normal                         (orbits and profile)  Lips:                  Previously seen        Bladder:                Appears normal  Heart:                 Appears normal         Spine:                  Previously seen                         (4CH, axis, and                         situs)  RVOT:                  Appears normal         Upper Extremities:      Previously seen  LVOT:                  Previously seen        Lower Extremities:      Previously seen ---------------------------------------------------------------------- Cervix Uterus Adnexa  Cervix  Length:           3.94  cm.  Normal appearance by transabdominal scan. ---------------------------------------------------------------------- Comments  This patient was seen for a follow up growth scan due to  maternal obesity with a BMI of over 62.  She denies any  problems since her last exam.  She was informed that the fetal growth and amniotic fluid  level appears appropriate for her gestational age.  A follow up exam was scheduled in 4 weeks. ----------------------------------------------------------------------                   Ma Rings, MD Electronically Signed Final Report   11/06/2020 05:02 pm ----------------------------------------------------------------------    Assessment   29 y.o.  [id number] at [redacted]w[redacted]d by  02/20/2021, by Last Menstrual Period presenting for routine prenatal visit  Plan   pregnancy3  Problems (from 05/14/20 to present)     Problem Noted Resolved   Obesity complicating pregnancy in third trimester 08/05/2020 by Nadara Mustard, MD No   Overview Signed 08/05/2020  9:55 AM by Nadara Mustard, MD    BMI >=40 [x ] early 1h gtt -  [x ] screen sleep  apnea [x ] anesthesia consult (early and late if BMI > 45) [x ] u/s for dating [x ]  [x ] nutritional goals [x ] folic acid 1mg  [x ] bASA (>12 weeks) [ ]  consider nutrition consult [ ]  consider maternal EKG 1st trimester [x ] Growth u/s 28 [ ] , 32 [ ] , 36 weeks [ ]  [x ] NST/AFI weekly 34+ weeks (34[] ,35[] ,36[] , 37[] , 38[] , 39[] , 40[] ) [ ]  IOL by 41 weeks (scheduled, prn [] )       Supervision of high risk pregnancy, antepartum 07/08/2020 by , MD No   Overview Addendum 11/17/2020 10:23 AM by , MD     Nursing Staff Provider  Office Location  [facility] Dating  LMP  Language  English Anatomy  MFM- complete, normal  Flu Vaccine   Genetic Screen  NIPS:nml XX   TDaP vaccine    Hgb A1C or  GTT Early :116 Third trimester :   Covid  vaccinated   LAB RESULTS   Rhogam  Not needed Blood Type A/Positive/-- (05/03 1603)   Feeding Plan Breast/bottle Antibody Negative (05/03 1603)  Contraception IUD- Mirena Rubella 3.47 (05/03 1603)  Circumcision  RPR Non Reactive (05/03 1603)   Pediatrician   HBsAg Negative (05/03 1603)   Support Person  HIV Non Reactive (05/03 1603)  Prenatal Classes  Varicella Immune    GBS  (For PCN allergy, check sensitivities)   BTL Consent     VBAC Consent  Pap  2022, NILm HPV+ (16/18 neg), Pap 01/2019, ASCUS, HPV neg    Hgb Electro      CF      SMA     Hep C neg  Obesity: Pregravid BMI: 5            Gestational age appropriate obstetric precautions including but not limited to vaginal bleeding, contractions, leaking of fluid and fetal movement were reviewed in detail with the patient.    28 week labs today  Return in about 2 weeks (around 12/15/2020) for ROB.  02-17-2006, MD, 02-17-2006 [facility], [facility] 12/01/2020, 10:22 AM

## 2020-12-02 ENCOUNTER — Other Ambulatory Visit: Payer: Self-pay

## 2020-12-02 LAB — 28 WEEK RH+PANEL
Basophils Absolute: 0 10*3/uL (ref 0.0–0.2)
Basos: 0 %
EOS (ABSOLUTE): 0.5 10*3/uL — ABNORMAL HIGH (ref 0.0–0.4)
Eos: 6 %
Gestational Diabetes Screen: 130 mg/dL (ref 65–139)
HIV Screen 4th Generation wRfx: NONREACTIVE
Hematocrit: 31.6 % — ABNORMAL LOW (ref 34.0–46.6)
Hemoglobin: 10.6 g/dL — ABNORMAL LOW (ref 11.1–15.9)
Immature Grans (Abs): 0 10*3/uL (ref 0.0–0.1)
Immature Granulocytes: 0 %
Lymphocytes Absolute: 1.9 10*3/uL (ref 0.7–3.1)
Lymphs: 21 %
MCH: 25.9 pg — ABNORMAL LOW (ref 26.6–33.0)
MCHC: 33.5 g/dL (ref 31.5–35.7)
MCV: 77 fL — ABNORMAL LOW (ref 79–97)
Monocytes Absolute: 0.5 10*3/uL (ref 0.1–0.9)
Monocytes: 6 %
Neutrophils Absolute: 6.1 10*3/uL (ref 1.4–7.0)
Neutrophils: 67 %
Platelets: 219 10*3/uL (ref 150–450)
RBC: 4.09 x10E6/uL (ref 3.77–5.28)
RDW: 14.1 % (ref 11.7–15.4)
RPR Ser Ql: NONREACTIVE
WBC: 9.1 10*3/uL (ref 3.4–10.8)

## 2020-12-04 ENCOUNTER — Other Ambulatory Visit: Payer: Self-pay

## 2020-12-04 ENCOUNTER — Ambulatory Visit: Payer: Medicaid Other | Attending: Maternal & Fetal Medicine

## 2020-12-04 VITALS — BP 134/72 | HR 91 | Temp 98.1°F | Ht 60.0 in | Wt 318.0 lb

## 2020-12-04 DIAGNOSIS — O99213 Obesity complicating pregnancy, third trimester: Secondary | ICD-10-CM | POA: Diagnosis not present

## 2020-12-04 DIAGNOSIS — E669 Obesity, unspecified: Secondary | ICD-10-CM | POA: Diagnosis not present

## 2020-12-04 DIAGNOSIS — Z3A28 28 weeks gestation of pregnancy: Secondary | ICD-10-CM | POA: Diagnosis not present

## 2020-12-18 ENCOUNTER — Other Ambulatory Visit: Payer: Self-pay

## 2020-12-18 ENCOUNTER — Encounter: Payer: Self-pay | Admitting: Advanced Practice Midwife

## 2020-12-18 ENCOUNTER — Ambulatory Visit (INDEPENDENT_AMBULATORY_CARE_PROVIDER_SITE_OTHER): Payer: Medicaid Other | Admitting: Advanced Practice Midwife

## 2020-12-18 VITALS — BP 130/72 | Wt 321.0 lb

## 2020-12-18 DIAGNOSIS — Z23 Encounter for immunization: Secondary | ICD-10-CM

## 2020-12-18 DIAGNOSIS — Z3A30 30 weeks gestation of pregnancy: Secondary | ICD-10-CM

## 2020-12-18 DIAGNOSIS — O0990 Supervision of high risk pregnancy, unspecified, unspecified trimester: Secondary | ICD-10-CM

## 2020-12-18 DIAGNOSIS — O99213 Obesity complicating pregnancy, third trimester: Secondary | ICD-10-CM

## 2020-12-18 LAB — POCT URINALYSIS DIPSTICK OB
Glucose, UA: NEGATIVE
POC,PROTEIN,UA: NEGATIVE

## 2020-12-18 NOTE — Addendum Note (Signed)
Addended by: Donnetta Hail on: 12/18/2020 10:39 AM   Modules accepted: Orders

## 2020-12-18 NOTE — Progress Notes (Signed)
Routine Prenatal Care Visit  Subjective  Alexis Gutierrez is a 30 y.o. G3P2002 at [redacted]w[redacted]d being seen today for ongoing prenatal care.  She is currently monitored for the following issues for this high-risk pregnancy and has Ovarian cyst affecting pregnancy in first trimester, antepartum; History of learning disability; Pelvic pain affecting pregnancy; Hives; Supervision of high risk pregnancy, antepartum; Obesity affecting pregnancy; Anxiety; and Recurrent major depressive disorder (HCC) on their problem list.  ----------------------------------------------------------------------------------- Patient reports no complaints.   Contractions: Not present. Vag. Bleeding: None.  Movement: Present. Leaking Fluid denies.  ----------------------------------------------------------------------------------- The following portions of the patient's history were reviewed and updated as appropriate: allergies, current medications, past family history, past medical history, past social history, past surgical history and problem list. Problem list updated.  Objective  Blood pressure 130/72, weight (!) 321 lb (145.6 kg), last menstrual period 05/16/2020 Pregravid weight 320 lb (145.2 kg) Total Weight Gain 1 lb (0.454 kg) Urinalysis: Urine Protein    Urine Glucose    Fetal Status: Fetal Heart Rate (bpm): 152   Movement: Present     General:  Alert, oriented and cooperative. Patient is in no acute distress.  Skin: Skin is warm and dry. No rash noted.   Cardiovascular: Normal heart rate noted  Respiratory: Normal respiratory effort, no problems with respiration noted  Abdomen: Soft, gravid, appropriate for gestational age. Pain/Pressure: Absent     Pelvic:  Cervical exam deferred        Extremities: Normal range of motion.  Edema: None  Mental Status: Normal mood and affect. Normal behavior. Normal judgment and thought content.   Assessment   30 y.o. [id number] at [redacted]w[redacted]d by  02/20/2021, by Last Menstrual Period  presenting for routine prenatal visit  Plan   pregnancy3  Problems (from 05/14/20 to present)    Problem Noted Resolved   Obesity affecting pregnancy 08/05/2020 by Nadara Mustard, MD No   Overview Signed 08/05/2020  9:55 AM by Nadara Mustard, MD    BMI >=40 [x ] early 1h gtt -  [x ] screen sleep apnea [x ] anesthesia consult (early and late if BMI > 45) [x ] u/s for dating [x ]  [x ] nutritional goals [x ] folic acid 1mg  [x ] bASA (>12 weeks) [ ]  consider nutrition consult [ ]  consider maternal EKG 1st trimester [x ] Growth u/s 28 [ ] , 32 [ ] , 36 weeks [ ]  [x ] NST/AFI weekly 34+ weeks (34[] ,35[] ,36[] , 37[] , 38[] , 39[] , 40[] ) [ ]  IOL by 41 weeks (scheduled, prn [] )       Supervision of high risk pregnancy, antepartum 07/08/2020 by , MD No   Overview Addendum 12/18/2020 10:19 AM by , CNM     Nursing Staff Provider  Office Location  [facility] Dating  LMP  Language  English Anatomy  MFM- complete, normal  Flu Vaccine   Genetic Screen  NIPS:nml XX   TDaP vaccine   12/18/20 Hgb A1C or  GTT Early :116 Third trimester :   Covid  vaccinated   LAB RESULTS   Rhogam  Not needed Blood Type A/Positive/-- (05/03 1603)   Feeding Plan Breast/bottle Antibody Negative (05/03 1603)  Contraception IUD- Mirena Rubella 3.47 (05/03 1603)  Circumcision  RPR Non Reactive (05/03 1603)   Pediatrician   HBsAg Negative (05/03 1603)   Support Person  HIV Non Reactive (05/03 1603)  Prenatal Classes  Varicella Immune    GBS  (For PCN allergy, check sensitivities)   BTL  Consent     VBAC Consent  Pap  2022, NILm HPV+ (16/18 neg), Pap 01/2019, ASCUS, HPV neg    Hgb Electro      CF      SMA     Hep C neg   Obesity: Pregravid BMI: 62           Preterm labor symptoms and general obstetric precautions including but not limited to vaginal bleeding, contractions, leaking of fluid and fetal movement were reviewed in detail with the patient. Please refer to After  Visit Summary for other counseling recommendations.   Return in about 2 weeks (around 01/01/2021) for rob with MD.  Tresea Mall, CNM 12/18/2020 10:35 AM

## 2020-12-18 NOTE — Progress Notes (Signed)
ROB- TDAP today and BT consent signed today, no concerns

## 2020-12-22 ENCOUNTER — Other Ambulatory Visit: Payer: Self-pay | Admitting: Obstetrics and Gynecology

## 2020-12-22 DIAGNOSIS — O0990 Supervision of high risk pregnancy, unspecified, unspecified trimester: Secondary | ICD-10-CM

## 2020-12-22 DIAGNOSIS — O99213 Obesity complicating pregnancy, third trimester: Secondary | ICD-10-CM

## 2020-12-24 ENCOUNTER — Inpatient Hospital Stay: Admission: RE | Admit: 2020-12-24 | Payer: Disability Insurance | Source: Ambulatory Visit

## 2020-12-24 ENCOUNTER — Encounter: Payer: Self-pay | Admitting: Anesthesiology

## 2020-12-24 ENCOUNTER — Other Ambulatory Visit: Payer: Self-pay

## 2020-12-24 ENCOUNTER — Encounter
Admission: RE | Admit: 2020-12-24 | Discharge: 2020-12-24 | Disposition: A | Discharge status: Home / Self Care | Payer: Disability Insurance | Source: Ambulatory Visit | Attending: Obstetrics and Gynecology | Admitting: Obstetrics and Gynecology

## 2020-12-24 NOTE — Consult Note (Signed)
Alexis Gutierrez Anesthesia Consultation  Alexis Gutierrez:[id number] DOB: [birth year] DOA: 12/24/2020 PCP: Leim Fabry, MD   Requesting physician: Dr. Tiburcio Pea Date of consultation: 12/24/20 Reason for consultation: Obesity during pregnancy  CHIEF COMPLAINT:  Obesity during pregnancy  HISTORY OF PRESENT ILLNESS: Alexis Gutierrez  is a 30 y.o. female with a known history of obesity during pregnancy. Two prior vaginal deliveries, had epidural analgesia for her last delivery that worked well. Denies hx of cardiovascular disease. She has SOB with exertion, but denies any new medication problems. She reports not gaining weight like her previous pregnancies and trying to walk regularly. She is taking aspirin to minimize thrombus formation due to her history of DVT with previous pregnancy.   PAST MEDICAL HISTORY:   Past Medical History:  Diagnosis Date   Anxiety    Asthma    Depression    Supervision of high risk pregnancy, antepartum 12/27/2017   Clinic [facility] Pren[facility] Dating 8wk5d ultrasound Blood type: --/--/A POS Performed at [facility], [address] [place], [place] [address]  [phone]/07 1943)  Genetic Screen  NIPS: Normal XY Antibody:Negative (10/10 1003) Anatomic [place] Complete at 23 weeks  Rubella: 3.70 (10/10 1003) Varicella: Immune GTT Early:  91             Third trimester: 127 RPR: Non Reactive (02/20 0954)  Rhoga    PAST SURGICAL HISTORY:  Past Surgical History:  Procedure Laterality Date   TONSILLECTOMY      SOCIAL HISTORY:  Social History   Tobacco Use   Smoking status: Former    Types: Cigarettes    Quit date: 06/11/2020    Years since quitting: 0.5   Smokeless tobacco: Never  Substance Use Topics   Alcohol use: No    FAMILY HISTORY:  Family History  Problem Relation Age of Onset   Breast cancer Paternal Aunt        90s    DRUG ALLERGIES:  Allergies  Allergen Reactions   Amoxicillin Shortness Of Breath     Has patient had a PCN reaction causing immediate rash, facial/tongue/throat swelling, SOB or lightheadedness with hypotension: Yes Has patient had a PCN reaction causing severe rash involving mucus membranes or skin necrosis: No Has patient had a PCN reaction that required hospitalization: No Has patient had a PCN reaction occurring within the last 10 years: No If all of the above answers are "NO", then may proceed with Cephalosporin use.   Eggs Or Egg-Derived Products Other (See Comments)    Acid reflux   Penicillins Swelling    Has patient had a PCN reaction causing immediate rash, facial/tongue/throat swelling, SOB or lightheadedness with hypotension: Yes Has patient had a PCN reaction causing severe rash involving mucus membranes or skin necrosis: No Has patient had a PCN reaction that required hospitalization: No Has patient had a PCN reaction occurring within the last 10 years: No If all of the above answers are "NO", then may proceed with Cephalosporin use.    Tomato Other (See Comments)    Acid reflux     REVIEW OF SYSTEMS:   RESPIRATORY: No cough, shortness of breath, wheezing.  CARDIOVASCULAR: No chest pain, orthopnea, edema.  HEMATOLOGY: No anemia, easy bruising or bleeding SKIN: No rash or lesion. NEUROLOGIC: No tingling, numbness, weakness.  PSYCHIATRY: No anxiety or depression.    PHYSICAL EXAMINATION:   VITAL SIGNS: Last menstrual period 05/16/2020, unknown if currently breastfeeding.  GENERAL:  30 y.o.-year-old patient no acute distress.  HEENT: Head  atraumatic, normocephalic. Oropharynx and nasopharynx clear. MP 2, TM distance >3 cm, normal mouth opening. LUNGS: No use of accessory muscles of respiration.   EXTREMITIES: No pedal edema, cyanosis, or clubbing.  NEUROLOGIC: moves all extremities, sensation intact, strength 5/5 in major muscle groups PSYCHIATRIC: The patient is alert and oriented x 3.  SKIN: No obvious rash, lesion, or ulcer.    IMPRESSION AND  PLAN:   Alexis Gutierrez  is a 30 y.o. female presenting for re-evaluation with obesity during pregnancy. BMI is currently 62, stable, at [redacted] weeks gestation. It was 62 at [redacted] weeks gestation.   Airway exam continues to be reassuring today. Spinal interspaces are minimally palpable.   I re-iterated analgesic options during labor including epidural analgesia. Discussed that in obesity there can be increased difficulty with epidural placement or even failure of successful epidural. We also discussed that even after successful epidural placement there is increased risk of catheter migration out of the epidural space that would require catheter replacement. Discussed use of epidural vs spinal vs GA if cesarean delivery is required. Discussed increased risk of difficult intubation during pregnancy should an emergency cesarean delivery be required.   We discussed that she can be delivered at [facility]. She did not have any unanswered questions.

## 2020-12-31 ENCOUNTER — Other Ambulatory Visit: Payer: Self-pay

## 2020-12-31 ENCOUNTER — Ambulatory Visit (INDEPENDENT_AMBULATORY_CARE_PROVIDER_SITE_OTHER): Payer: Medicaid Other | Admitting: Obstetrics and Gynecology

## 2020-12-31 VITALS — BP 114/71 | Wt 320.0 lb

## 2020-12-31 DIAGNOSIS — O0990 Supervision of high risk pregnancy, unspecified, unspecified trimester: Secondary | ICD-10-CM

## 2020-12-31 DIAGNOSIS — O99213 Obesity complicating pregnancy, third trimester: Secondary | ICD-10-CM

## 2020-12-31 DIAGNOSIS — Z3A32 32 weeks gestation of pregnancy: Secondary | ICD-10-CM

## 2020-12-31 LAB — POCT URINALYSIS DIPSTICK OB
Glucose, UA: NEGATIVE
POC,PROTEIN,UA: NEGATIVE

## 2020-12-31 NOTE — Progress Notes (Signed)
ROB - no concerns. RM 2 

## 2020-12-31 NOTE — Progress Notes (Signed)
Routine Prenatal Care Visit  Subjective  Alexis Gutierrez is a 30 y.o. G3P2002 at [redacted]w[redacted]d being seen today for ongoing prenatal care.  She is currently monitored for the following issues for this high-risk pregnancy and has Ovarian cyst affecting pregnancy in first trimester, antepartum; History of learning disability; Pelvic pain affecting pregnancy; Hives; Supervision of high risk pregnancy, antepartum; Obesity affecting pregnancy; Anxiety; and Recurrent major depressive disorder (HCC) on their problem list.  ----------------------------------------------------------------------------------- Patient reports no complaints.   Contractions: Not present. Vag. Bleeding: None.  Movement: Present. Denies leaking of fluid.  ----------------------------------------------------------------------------------- The following portions of the patient's history were reviewed and updated as appropriate: allergies, current medications, past family history, past medical history, past social history, past surgical history and problem list. Problem list updated.   Objective  Blood pressure 114/71, weight (!) 320 lb (145.2 kg), last menstrual period 05/16/2020, unknown if currently breastfeeding. Pregravid weight 320 lb (145.2 kg) Total Weight Gain 0 lb (0 kg) Urinalysis:      Fetal Status: Fetal Heart Rate (bpm): 145   Movement: Present     General:  Alert, oriented and cooperative. Patient is in no acute distress.  Skin: Skin is warm and dry. No rash noted.   Cardiovascular: Normal heart rate noted  Respiratory: Normal respiratory effort, no problems with respiration noted  Abdomen: Soft, gravid, appropriate for gestational age. Pain/Pressure: Absent     Pelvic:  Cervical exam deferred        Extremities: Normal range of motion.     ental Status: Normal mood and affect. Normal behavior. Normal judgment and thought content.     Assessment   30 y.o. [id number] at [redacted]w[redacted]d by  02/20/2021, by Last Menstrual  Period presenting for routine prenatal visit  Plan   pregnancy3  Problems (from 05/14/20 to present)     Problem Noted Resolved   Obesity affecting pregnancy 08/05/2020 by Nadara Mustard, MD No   Overview Signed 08/05/2020  9:55 AM by Nadara Mustard, MD    BMI >=40 [x ] early 1h gtt -  [x ] screen sleep apnea [x ] anesthesia consult (early and late if BMI > 45) [x ] u/s for dating [x ]  [x ] nutritional goals [x ] folic acid 1mg  [x ] bASA (>12 weeks) [ ]  consider nutrition consult [ ]  consider maternal EKG 1st trimester [x ] Growth u/s 28 [ ] , 32 [ ] , 36 weeks [ ]  [x ] NST/AFI weekly 34+ weeks (34[] ,35[] ,36[] , 37[] , 38[] , 39[] , 40[] ) [ ]  IOL by 41 weeks (scheduled, prn [] )       Supervision of high risk pregnancy, antepartum 07/08/2020 by , MD No   Overview Addendum 12/18/2020 10:19 AM by , CNM     Nursing Staff Provider  Office Location  [facility] Dating  LMP  Language  English Anatomy  MFM- complete, normal  Flu Vaccine   Genetic Screen  NIPS:nml XX   TDaP vaccine   12/18/20 Hgb A1C or  GTT Early :116 Third trimester :   Covid  vaccinated   LAB RESULTS   Rhogam  Not needed Blood Type A/Positive/-- (05/03 1603)   Feeding Plan Breast/bottle Antibody Negative (05/03 1603)  Contraception IUD- Mirena Rubella 3.47 (05/03 1603)  Circumcision  RPR Non Reactive (05/03 1603)   Pediatrician   HBsAg Negative (05/03 1603)   Support Person  HIV Non Reactive (05/03 1603)  Prenatal Classes  Varicella Immune    GBS  (For PCN  allergy, check sensitivities)   BTL Consent     VBAC Consent  Pap  2022, NILm HPV+ (16/18 neg), Pap 01/2019, ASCUS, HPV neg    Hgb Electro      CF      SMA     Hep C neg  Obesity: Pregravid BMI: 77            Gestational age appropriate obstetric precautions including but not limited to vaginal bleeding, contractions, leaking of fluid and fetal movement were reviewed in detail with the patient.    Return in about  2 weeks (around 01/14/2021) for ROB.  Vena Austria, MD, [facility], [facility] 12/31/2020, 3:38 PM

## 2021-01-13 ENCOUNTER — Other Ambulatory Visit: Payer: Self-pay

## 2021-01-13 DIAGNOSIS — F419 Anxiety disorder, unspecified: Secondary | ICD-10-CM

## 2021-01-13 DIAGNOSIS — O99213 Obesity complicating pregnancy, third trimester: Secondary | ICD-10-CM

## 2021-01-14 ENCOUNTER — Other Ambulatory Visit: Payer: Self-pay

## 2021-01-14 ENCOUNTER — Ambulatory Visit (INDEPENDENT_AMBULATORY_CARE_PROVIDER_SITE_OTHER): Payer: Medicaid Other | Admitting: Obstetrics and Gynecology

## 2021-01-14 VITALS — BP 114/62 | Wt 317.0 lb

## 2021-01-14 DIAGNOSIS — O99213 Obesity complicating pregnancy, third trimester: Secondary | ICD-10-CM

## 2021-01-14 DIAGNOSIS — O0990 Supervision of high risk pregnancy, unspecified, unspecified trimester: Secondary | ICD-10-CM

## 2021-01-14 LAB — POCT URINALYSIS DIPSTICK OB: Glucose, UA: NEGATIVE

## 2021-01-14 NOTE — Progress Notes (Signed)
Routine Prenatal Care Visit  Subjective  Alexis Gutierrez is a 30 y.o. G3P2002 at [redacted]w[redacted]d being seen today for ongoing prenatal care.  She is currently monitored for the following issues for this high-risk pregnancy and has Ovarian cyst affecting pregnancy in first trimester, antepartum; History of learning disability; Pelvic pain affecting pregnancy; Hives; Supervision of high risk pregnancy, antepartum; Obesity affecting pregnancy; Anxiety; and Recurrent major depressive disorder ([place]) on their problem list.  ----------------------------------------------------------------------------------- Patient reports  no complaints .   Contractions: Not present. Vag. Bleeding: None.  Movement: Present. Denies leaking of fluid.  ----------------------------------------------------------------------------------- The following portions of the patient's history were reviewed and updated as appropriate: allergies, current medications, past family history, past medical history, past social history, past surgical history and problem list. Problem list updated.   Objective  Blood pressure 114/62, weight (!) 317 lb (143.8 kg), last menstrual period 05/16/2020, unknown if currently breastfeeding.  Pregravid weight 320 lb (145.2 kg) Total Weight Gain -3 lb (-1.361 kg) Body mass index is 61.91 kg/m.   Urinalysis:      Fetal Status: Fetal Heart Rate (bpm): 138   Movement: Present  Presentation: Vertex  General:  Alert, oriented and cooperative. Patient is in no acute distress.  Skin: Skin is warm and dry. No rash noted.   Cardiovascular: Normal heart rate noted  Respiratory: Normal respiratory effort, no problems with respiration noted  Abdomen: Soft, gravid, appropriate for gestational age. Pain/Pressure: Present     Pelvic:  Cervical exam performed Dilation: 1 Effacement (%): 50 Station: -3  Extremities: Normal range of motion.     ental Status: Normal mood and affect. Normal behavior. Normal judgment  and thought content.     Assessment   30 y.o. [id number] at [redacted]w[redacted]d by  02/20/2021, by Last Menstrual Period presenting for routine prenatal visit  Plan   pregnancy3  Problems (from 05/14/20 to present)     Problem Noted Resolved   Obesity affecting pregnancy 08/05/2020 by Gae Dry, MD No   Overview Signed 08/05/2020  9:55 AM by Gae Dry, MD    BMI >=40 [x ] early 1h gtt -  [x ] screen sleep apnea [x ] anesthesia consult (early and late if BMI > 45) [x ] u/s for dating [x ]  [x ] nutritional goals [x ] folic acid 1mg  [x ] bASA (>12 weeks) [ ]  consider nutrition consult [ ]  consider maternal EKG 1st trimester [x ] Growth u/s 28 [ ] , 32 [ ] , 36 weeks [ ]  [x ] NST/AFI weekly 34+ weeks (34[] ,35[] ,36[] , 37[] , 38[] , 39[] , 40[] ) [ ]  IOL by 41 weeks (scheduled, prn [] )       Supervision of high risk pregnancy, antepartum 07/08/2020 by Homero Fellers, MD No   Overview Addendum 12/18/2020 10:19 AM by Rod Can, CNM     Nursing Staff Provider  Office Location  [facility] Dating  LMP  Language  English Anatomy US  MFM- complete, normal  Flu Vaccine   Genetic Screen  NIPS:nml XX   TDaP vaccine   12/18/20 Hgb A1C or  GTT Early :116 Third trimester :   Covid  vaccinated   LAB RESULTS   Rhogam  Not needed Blood Type A/Positive/-- (05/03 1603)   Feeding Plan Breast/bottle Antibody Negative (05/03 1603)  Contraception IUD- Mirena Rubella 3.47 (05/03 1603)  Circumcision  RPR Non Reactive (05/03 1603)   Pediatrician   HBsAg Negative (05/03 1603)   Support Person  HIV Non Reactive (05/03 1603)  Prenatal Classes  Varicella Immune    GBS  (For PCN allergy, check sensitivities)   BTL Consent     VBAC Consent  Pap  2022, NILm HPV+ (16/18 neg), Pap 01/2019, ASCUS, HPV neg    Hgb Electro      CF      SMA     Hep C neg  Obesity: Pregravid BMI: 98            Gestational age appropriate obstetric precautions including but not limited to vaginal bleeding,  contractions, leaking of fluid and fetal movement were reviewed in detail with the patient.    Return in about 9 days (around 01/23/2021) for ROB, NST.  Vena Austria, MD, [facility], [facility] 01/14/2021, 3:40 PM

## 2021-01-15 ENCOUNTER — Ambulatory Visit: Payer: Medicaid Other | Attending: Maternal & Fetal Medicine

## 2021-01-15 ENCOUNTER — Other Ambulatory Visit: Payer: Self-pay

## 2021-01-15 DIAGNOSIS — F419 Anxiety disorder, unspecified: Secondary | ICD-10-CM | POA: Diagnosis not present

## 2021-01-15 DIAGNOSIS — Z3A34 34 weeks gestation of pregnancy: Secondary | ICD-10-CM | POA: Diagnosis not present

## 2021-01-15 DIAGNOSIS — O99343 Other mental disorders complicating pregnancy, third trimester: Secondary | ICD-10-CM | POA: Insufficient documentation

## 2021-01-15 DIAGNOSIS — O99213 Obesity complicating pregnancy, third trimester: Secondary | ICD-10-CM | POA: Insufficient documentation

## 2021-01-15 DIAGNOSIS — E669 Obesity, unspecified: Secondary | ICD-10-CM | POA: Diagnosis not present

## 2021-01-21 ENCOUNTER — Encounter: Payer: Self-pay | Admitting: Advanced Practice Midwife

## 2021-01-21 ENCOUNTER — Encounter: Payer: Medicaid Other | Admitting: Advanced Practice Midwife

## 2021-01-21 ENCOUNTER — Other Ambulatory Visit: Payer: Self-pay

## 2021-01-21 ENCOUNTER — Ambulatory Visit (INDEPENDENT_AMBULATORY_CARE_PROVIDER_SITE_OTHER): Payer: Medicaid Other | Admitting: Advanced Practice Midwife

## 2021-01-21 VITALS — Wt 322.0 lb

## 2021-01-21 DIAGNOSIS — Z3A35 35 weeks gestation of pregnancy: Secondary | ICD-10-CM

## 2021-01-21 DIAGNOSIS — O0993 Supervision of high risk pregnancy, unspecified, third trimester: Secondary | ICD-10-CM

## 2021-01-21 DIAGNOSIS — O99213 Obesity complicating pregnancy, third trimester: Secondary | ICD-10-CM | POA: Diagnosis not present

## 2021-01-21 LAB — FETAL NONSTRESS TEST

## 2021-01-21 NOTE — Progress Notes (Signed)
Routine Prenatal Care Visit  Subjective  Alexis CREAMER is a 30 y.o. G3P2002 at [redacted]w[redacted]d being seen today for ongoing prenatal care.  She is currently monitored for the following issues for this high-risk pregnancy and has Ovarian cyst affecting pregnancy in first trimester, antepartum; History of learning disability; Pelvic pain affecting pregnancy; Hives; Supervision of high risk pregnancy, antepartum; Obesity affecting pregnancy; Anxiety; and Recurrent major depressive disorder (HCC) on their problem list.  ----------------------------------------------------------------------------------- Patient reports no complaints.   Contractions: Not present. Vag. Bleeding: None.  Movement: Present. Leaking Fluid denies.  ----------------------------------------------------------------------------------- The following portions of the patient's history were reviewed and updated as appropriate: allergies, current medications, past family history, past medical history, past social history, past surgical history and problem list. Problem list updated.  Objective  Weight (!) 322 lb (146.1 kg), last menstrual period 05/16/2020 Pregravid weight 320 lb (145.2 kg) Total Weight Gain 2 lb (0.907 kg) Urinalysis: Urine Protein    Urine Glucose    Fetal Status: Fetal Heart Rate (bpm): 140   Movement: Present      NST: reactive 20 minute tracing, 140 bpm, moderate variability, +accelerations, -decelerations  General:  Alert, oriented and cooperative. Patient is in no acute distress.  Skin: Skin is warm and dry. No rash noted.   Cardiovascular: Normal heart rate noted  Respiratory: Normal respiratory effort, no problems with respiration noted  Abdomen: Soft, gravid, appropriate for gestational age. Pain/Pressure: Absent     Pelvic:  Cervical exam deferred        Extremities: Normal range of motion.     Mental Status: Normal mood and affect. Normal behavior. Normal judgment and thought content.   Assessment    30 y.o. G3T5176 at [redacted]w[redacted]d by  02/20/2021, by Last Menstrual Period presenting for routine prenatal visit  Plan   pregnancy3  Problems (from 05/14/20 to present)    Problem Noted Resolved   Obesity affecting pregnancy 08/05/2020 by Nadara Mustard, MD No   Overview Signed 08/05/2020  9:55 AM by Nadara Mustard, MD    BMI >=40 [x ] early 1h gtt -  [x ] screen sleep apnea [x ] anesthesia consult (early and late if BMI > 45) [x ] u/s for dating [x ]  [x ] nutritional goals [x ] folic acid 1mg  [x ] bASA (>12 weeks) [ ]  consider nutrition consult [ ]  consider maternal EKG 1st trimester [x ] Growth u/s 28 [ ] , 32 [ ] , 36 weeks [ ]  [x ] NST/AFI weekly 34+ weeks (34[] ,35[] ,36[] , 37[] , 38[] , 39[] , 40[] ) [ ]  IOL by 41 weeks (scheduled, prn [] )       Supervision of high risk pregnancy, antepartum 07/08/2020 by , MD No   Overview Addendum 12/18/2020 10:19 AM by , CNM     Nursing Staff Provider  Office Location  [facility] Dating  LMP  Language  English Anatomy  MFM- complete, normal  Flu Vaccine   Genetic Screen  NIPS:nml XX   TDaP vaccine   12/18/20 Hgb A1C or  GTT Early :116 Third trimester :   Covid  vaccinated   LAB RESULTS   Rhogam  Not needed Blood Type A/Positive/-- (05/03 1603)   Feeding Plan Breast/bottle Antibody Negative (05/03 1603)  Contraception IUD- Mirena Rubella 3.47 (05/03 1603)  Circumcision  RPR Non Reactive (05/03 1603)   Pediatrician   HBsAg Negative (05/03 1603)   Support Person  HIV Non Reactive (05/03 1603)  Prenatal Classes  Varicella Immune  GBS  (For PCN allergy, check sensitivities)   BTL Consent     VBAC Consent  Pap  2022, NILm HPV+ (16/18 neg), Pap 01/2019, ASCUS, HPV neg    Hgb Electro      CF      SMA     Hep C neg   Obesity: Pregravid BMI: 62           Preterm labor symptoms and general obstetric precautions including but not limited to vaginal bleeding, contractions, leaking of fluid and fetal  movement were reviewed in detail with the patient.   Return in about 1 week (around 01/28/2021) for rob/nst.  Tresea Mall, CNM 01/21/2021 11:53 AM

## 2021-01-22 ENCOUNTER — Encounter: Payer: Medicaid Other | Admitting: Obstetrics and Gynecology

## 2021-01-27 ENCOUNTER — Encounter: Payer: Medicaid Other | Admitting: Obstetrics

## 2021-02-02 ENCOUNTER — Ambulatory Visit (INDEPENDENT_AMBULATORY_CARE_PROVIDER_SITE_OTHER): Payer: Medicaid Other | Admitting: Advanced Practice Midwife

## 2021-02-02 ENCOUNTER — Other Ambulatory Visit (HOSPITAL_COMMUNITY)
Admission: RE | Admit: 2021-02-02 | Discharge: 2021-02-02 | Disposition: A | Discharge status: Home / Self Care | Payer: Medicaid Other | Source: Ambulatory Visit | Attending: Advanced Practice Midwife | Admitting: Advanced Practice Midwife

## 2021-02-02 ENCOUNTER — Encounter: Payer: Self-pay | Admitting: Advanced Practice Midwife

## 2021-02-02 ENCOUNTER — Other Ambulatory Visit: Payer: Self-pay

## 2021-02-02 VITALS — BP 126/84 | Wt 317.0 lb

## 2021-02-02 DIAGNOSIS — Z3A37 37 weeks gestation of pregnancy: Secondary | ICD-10-CM | POA: Insufficient documentation

## 2021-02-02 DIAGNOSIS — O99213 Obesity complicating pregnancy, third trimester: Secondary | ICD-10-CM

## 2021-02-02 DIAGNOSIS — O0993 Supervision of high risk pregnancy, unspecified, third trimester: Secondary | ICD-10-CM | POA: Diagnosis present

## 2021-02-02 DIAGNOSIS — Z113 Encounter for screening for infections with a predominantly sexual mode of transmission: Secondary | ICD-10-CM | POA: Insufficient documentation

## 2021-02-02 DIAGNOSIS — Z369 Encounter for antenatal screening, unspecified: Secondary | ICD-10-CM

## 2021-02-02 DIAGNOSIS — Z3685 Encounter for antenatal screening for Streptococcus B: Secondary | ICD-10-CM

## 2021-02-02 NOTE — Progress Notes (Signed)
Routine Prenatal Care Visit  Subjective  Alexis Gutierrez is a 30 y.o. G3P2002 at [redacted]w[redacted]d being seen today for ongoing prenatal care.  She is currently monitored for the following issues for this high-risk pregnancy and has Ovarian cyst affecting pregnancy in first trimester, antepartum; History of learning disability; Pelvic pain affecting pregnancy; Hives; Supervision of high risk pregnancy, antepartum; Obesity affecting pregnancy; Anxiety; and Recurrent major depressive disorder ([place]) on their problem list.  ----------------------------------------------------------------------------------- Patient reports no complaints.   Contractions: Not present. Vag. Bleeding: None.  Movement: Present. Leaking Fluid denies.  ----------------------------------------------------------------------------------- The following portions of the patient's history were reviewed and updated as appropriate: allergies, current medications, past family history, past medical history, past social history, past surgical history and problem list. Problem list updated.  Objective  Blood pressure 126/84, weight (!) 317 lb (143.8 kg), last menstrual period 05/16/2020 Pregravid weight 320 lb (145.2 kg) Total Weight Gain -3 lb (-1.361 kg) Urinalysis: Urine Protein    Urine Glucose    Fetal Status: Fetal Heart Rate (bpm): 145   Movement: Present      NST: reactive 20 minute tracing, 145 bpm, moderate variability, +accelerations, -decelerations  General:  Alert, oriented and cooperative. Patient is in no acute distress.  Skin: Skin is warm and dry. No rash noted.   Cardiovascular: Normal heart rate noted  Respiratory: Normal respiratory effort, no problems with respiration noted  Abdomen: Soft, gravid, appropriate for gestational age. Pain/Pressure: Absent     Pelvic:  GBS/aptima collected        Extremities: Normal range of motion.  Edema: None  Mental Status: Normal mood and affect. Normal behavior. Normal judgment and  thought content.   Assessment   30 y.o. [id number] at [redacted]w[redacted]d by  02/20/2021, by Last Menstrual Period presenting for routine prenatal visit  Plan   pregnancy3  Problems (from 05/14/20 to present)    Problem Noted Resolved   Obesity affecting pregnancy 08/05/2020 by Gae Dry, MD No   Overview Signed 08/05/2020  9:55 AM by Gae Dry, MD    BMI >=40 [x ] early 1h gtt -  [x ] screen sleep apnea [x ] anesthesia consult (early and late if BMI > 45) [x ] u/s for dating [x ]  [x ] nutritional goals [x ] folic acid 1mg  [x ] bASA (>12 weeks) [ ]  consider nutrition consult [ ]  consider maternal EKG 1st trimester [x ] Growth u/s 28 [ ] , 32 [ ] , 36 weeks [ ]  [x ] NST/AFI weekly 34+ weeks (34[] ,35[] ,36[] , 37[] , 38[] , 39[] , 40[] ) [ ]  IOL by 41 weeks (scheduled, prn [] )       Supervision of high risk pregnancy, antepartum 07/08/2020 by 18/05/2020, MD No   Overview Addendum 12/18/2020 10:19 AM by 12/31/2020, CNM     Nursing Staff Provider  Office Location  [facility] Dating  LMP  Language  English Anatomy Rod Can  MFM- complete, normal  Flu Vaccine   Genetic Screen  NIPS:nml XX   TDaP vaccine   12/18/20 Hgb A1C or  GTT Early :116 Third trimester :   Covid  vaccinated   LAB RESULTS   Rhogam  Not needed Blood Type A/Positive/-- (05/03 1603)   Feeding Plan Breast/bottle Antibody Negative (05/03 1603)  Contraception IUD- Mirena Rubella 3.47 (05/03 1603)  Circumcision  RPR Non Reactive (05/03 1603)   Pediatrician   HBsAg Negative (05/03 1603)   Support Person  HIV Non Reactive (05/03 1603)  Prenatal Classes  Varicella Immune  GBS  (For PCN allergy, check sensitivities)   BTL Consent     VBAC Consent  Pap  2022, NILm HPV+ (16/18 neg), Pap 01/2019, ASCUS, HPV neg    Hgb Electro      CF      SMA     Hep C neg   Obesity: Pregravid BMI: 62           Term labor symptoms and general obstetric precautions including but not limited to vaginal bleeding, contractions,  leaking of fluid and fetal movement were reviewed in detail with the patient. Please refer to After Visit Summary for other counseling recommendations.   Return in about 1 week (around 02/09/2021) for rob/nst with MD.  Tresea Mall, CNM 02/02/2021 3:30 PM

## 2021-02-02 NOTE — Progress Notes (Signed)
NST today

## 2021-02-04 LAB — CERVICOVAGINAL ANCILLARY ONLY
Chlamydia: NEGATIVE
Comment: NEGATIVE
Comment: NEGATIVE
Comment: NORMAL
Neisseria Gonorrhea: NEGATIVE
Trichomonas: NEGATIVE

## 2021-02-06 LAB — STREP GP B CULTURE+RFLX: Strep Gp B Culture+Rflx: POSITIVE — AB

## 2021-02-06 LAB — STREP GP B SUSCEPTIBILITY

## 2021-02-07 ENCOUNTER — Encounter: Payer: Self-pay | Admitting: Advanced Practice Midwife

## 2021-02-09 ENCOUNTER — Other Ambulatory Visit: Payer: Self-pay

## 2021-02-09 ENCOUNTER — Encounter: Payer: Medicaid Other | Admitting: Obstetrics and Gynecology

## 2021-02-09 ENCOUNTER — Encounter
Admission: RE | Admit: 2021-02-09 | Discharge: 2021-02-09 | Disposition: A | Discharge status: Home / Self Care | Payer: Medicaid Other | Source: Ambulatory Visit | Attending: Anesthesiology | Admitting: Anesthesiology

## 2021-02-09 NOTE — Consult Note (Signed)
[facility] Anesthesia Consultation  Alexis Gutierrez VPX:[id number] DOB: 12-13-1990 DOA: 02/09/2021 PCP: Leim Fabry, MD   Requesting physician: Tresea Mall Date of consultation: 02/09/21 Reason for consultation: Obesity during pregnancy  CHIEF COMPLAINT:  Obesity during pregnancy  HISTORY OF PRESENT ILLNESS: Alexis Gutierrez  is a 30 y.o. female with a known history of class 3 obesity, former smoker (quit this pregnancy), childhood asthma, childhood seizures, GERD, hx DVT, learning disability, anxiety, depression.  PAST MEDICAL HISTORY:   Past Medical History:  Diagnosis Date   Anxiety    Asthma    Depression    Supervision of high risk pregnancy, antepartum 12/27/2017   Clinic [facility] Prenatal Labs Dating 8wk5d ultrasound Blood type: --/--/A POS Performed at [facility], [address] [place], [place] [address]  [phone]/07 1943)  Genetic Screen  NIPS: Normal XY Antibody:Negative (10/10 1003) Anatomic [place] Complete at 23 weeks  Rubella: 3.70 (10/10 1003) Varicella: Immune GTT Early:  91             Third trimester: 127 RPR: Non Reactive (02/20 0954)  Rhoga    PAST SURGICAL HISTORY:  Past Surgical History:  Procedure Laterality Date   TONSILLECTOMY      SOCIAL HISTORY:  Social History   Tobacco Use   Smoking status: Former    Types: Cigarettes    Quit date: 06/11/2020    Years since quitting: 0.6   Smokeless tobacco: Never  Substance Use Topics   Alcohol use: No    FAMILY HISTORY:  Family History  Problem Relation Age of Onset   Breast cancer Paternal Aunt        16s    DRUG ALLERGIES:  Allergies  Allergen Reactions   Amoxicillin Shortness Of Breath    Has patient had a PCN reaction causing immediate rash, facial/tongue/throat swelling, SOB or lightheadedness with hypotension: Yes Has patient had a PCN reaction causing severe rash involving mucus membranes or skin necrosis: No Has patient had a PCN reaction that  required hospitalization: No Has patient had a PCN reaction occurring within the last 10 years: No If all of the above answers are "NO", then may proceed with Cephalosporin use.   Eggs Or Egg-Derived Products Other (See Comments)    Acid reflux   Penicillins Swelling    Has patient had a PCN reaction causing immediate rash, facial/tongue/throat swelling, SOB or lightheadedness with hypotension: Yes Has patient had a PCN reaction causing severe rash involving mucus membranes or skin necrosis: No Has patient had a PCN reaction that required hospitalization: No Has patient had a PCN reaction occurring within the last 10 years: No If all of the above answers are "NO", then may proceed with Cephalosporin use.    Tomato Other (See Comments)    Acid reflux     REVIEW OF SYSTEMS:   RESPIRATORY: No cough, shortness of breath, wheezing.  CARDIOVASCULAR: No chest pain, orthopnea, edema.  HEMATOLOGY: No anemia, easy bruising or bleeding SKIN: No rash or lesion. NEUROLOGIC: No tingling, numbness, weakness.  PSYCHIATRY: No anxiety or depression.   MEDICATIONS AT HOME:  Prior to Admission medications   Medication Sig Start Date End Date Taking? Authorizing Provider  aspirin 81 MG chewable tablet Chew 81 mg by mouth daily.    [provider]  Doxylamine-Pyridoxine (DICLEGIS) 10-10 MG TBEC Start with 2 tabs PO QHS, if needed on day 3, add 1 tab PO in AM, if needed on day 4, add a 4th tablet PO in afternoon Patient  not taking: Reported on 01/14/2021 07/22/20   Conard Novak, MD  Prenat-Fe Poly-Methfol-FA-DHA (VITAFOL FE+) 90-0.6-0.4-200 MG CAPS Take 1 tablet by mouth daily. 11/17/20   Natale Milch, MD      PHYSICAL EXAMINATION:   VITAL SIGNS: Last menstrual period 05/16/2020, unknown if currently breastfeeding.  GENERAL:  30 y.o.-year-old patient no acute distress.  HEENT: Head atraumatic, normocephalic. Oropharynx and nasopharynx clear. MP III, TM distance >3 cm, normal  mouth opening. LUNGS: No use of accessory muscles of respiration.   EXTREMITIES: No pedal edema, cyanosis, or clubbing.  NEUROLOGIC: normal gait PSYCHIATRIC: The patient is alert and oriented x 3.  SKIN: No obvious rash, lesion, or ulcer.    IMPRESSION AND PLAN:   Alexis Gutierrez  is a 30 y.o. female presenting with obesity during pregnancy. BMI is currently 62 at [redacted] weeks gestation.   We discussed analgesic options during labor including epidural analgesia. Discussed that in obesity there can be increased difficulty with epidural placement or even failure of successful epidural. We also discussed that even after successful epidural placement there is increased risk of catheter migration out of the epidural space that would require catheter replacement. Discussed use of epidural vs spinal vs GA if cesarean delivery is required. Discussed increased risk of difficult intubation during pregnancy should an emergency cesarean delivery be required.   Evaluated at 38 weeks by me.  Pt appears appropriate for care at [facility] even with BMI of 62.  She has had two prior IOLs with epidurals and subsequent successful vaginal births.  We discussed all potential anesthetic complications; pt understands and is willing to accept risks.  Airway exam adequate.  All questions answered and concerns addressed.

## 2021-02-13 ENCOUNTER — Encounter: Payer: Self-pay | Admitting: Obstetrics and Gynecology

## 2021-02-13 ENCOUNTER — Other Ambulatory Visit: Payer: Self-pay

## 2021-02-13 ENCOUNTER — Other Ambulatory Visit
Admission: RE | Admit: 2021-02-13 | Discharge: 2021-02-13 | Disposition: A | Discharge status: Home / Self Care | Payer: Medicaid Other | Source: Ambulatory Visit | Attending: Obstetrics and Gynecology | Admitting: Obstetrics and Gynecology

## 2021-02-13 ENCOUNTER — Ambulatory Visit (INDEPENDENT_AMBULATORY_CARE_PROVIDER_SITE_OTHER): Payer: Medicaid Other | Admitting: Obstetrics and Gynecology

## 2021-02-13 VITALS — BP 122/72 | Ht 60.0 in | Wt 313.0 lb

## 2021-02-13 DIAGNOSIS — Z01812 Encounter for preprocedural laboratory examination: Secondary | ICD-10-CM | POA: Diagnosis not present

## 2021-02-13 DIAGNOSIS — Z3A39 39 weeks gestation of pregnancy: Secondary | ICD-10-CM

## 2021-02-13 DIAGNOSIS — O0990 Supervision of high risk pregnancy, unspecified, unspecified trimester: Secondary | ICD-10-CM | POA: Diagnosis not present

## 2021-02-13 DIAGNOSIS — Z20822 Contact with and (suspected) exposure to covid-19: Secondary | ICD-10-CM | POA: Insufficient documentation

## 2021-02-13 NOTE — Patient Instructions (Signed)
Covid Testing at 02/13/2021 between 8 AM and 4 PM, in the [facility]. Follow signs for Pre-admission testing. Please wear a mask.  Induction 02/16/2021 at 8 AM . Enter through the [facility] for an 8 AM induction. Please enter through the ER for a midnight or 5 AM induction.  Please eat a meal prior to your arrival.     Labor Induction Labor induction is when steps are taken to cause a pregnant woman to begin the labor process. Most women go into labor on their own between 37 weeks and 42 weeks of pregnancy. When this does not happen, or when there is a medical need for labor to begin, steps may be taken to induce, or bring on, labor. Labor induction causes a pregnant woman's uterus to contract. It also causes the cervix to soften (ripen), open (dilate), and thin out. Usually, labor is not induced before 39 weeks of pregnancy unless there is a medical reason to do so. When is labor induction considered? Labor induction may be right for you if: Your pregnancy lasts longer than 41 to 42 weeks. Your placenta is separating from your uterus (placental abruption). You have a rupture of membranes and your labor does not begin. You have health problems, like diabetes or high blood pressure (preeclampsia) during your pregnancy. Your baby has stopped growing or does not have enough amniotic fluid. Before labor induction begins, your health care provider will consider the following factors: Your medical condition and the baby's condition. How many weeks you have been pregnant. How mature the baby's lungs are. The condition of your cervix. The position of the baby. The size of your birth canal. Tell a health care provider about: Any allergies you have. All medicines you are taking, including vitamins, herbs, eye drops, creams, and over-the-counter medicines. Any problems you or your family members have had with anesthetic medicines. Any surgeries you have had. Any blood disorders you  have. Any medical conditions you have. What are the risks? Generally, this is a safe procedure. However, problems may occur, including: Failed induction. Changes in fetal heart rate, such as being too high, too low, or irregular (erratic). Infection in the mother or the baby. Increased risk of having a cesarean delivery. Breaking off (abruption) of the placenta from the uterus. This is rare. Rupture of the uterus. This is very rare. Your baby could fail to get enough blood flow or oxygen. This can be life-threatening. When induction is needed for medical reasons, the benefits generally outweigh the risks. What happens during the procedure? During the procedure, your health care provider will use one of these methods to induce labor: Stripping the membranes. In this method, the amniotic sac tissue is gently separated from the cervix. This causes the following to happen: Your cervix stretches, which in turn causes the release of prostaglandins. Prostaglandins induce labor and cause the uterus to contract. This procedure is often done in an office visit. You will be sent home to wait for contractions to begin. Prostaglandin medicine. This medicine starts contractions and causes the cervix to dilate and ripen. This can be taken by mouth (orally) or by being inserted into the vagina (suppository). Inserting a small, thin tube (catheter) with a balloon into the vagina and then expanding the balloon with water to dilate the cervix. Breaking the water. In this method, a small instrument is used to make a small hole in the amniotic sac. This eventually causes the amniotic sac to break. Contractions should begin within  a few hours. Medicine to trigger or strengthen contractions. This medicine is given through an IV that is inserted into a vein in your arm. This procedure may vary among health care providers and hospitals. Where to find more information [employer]: www.marchofdimes.org [employer]: www.acog.org Summary Labor induction causes a pregnant woman's uterus to contract. It also causes the cervix to soften (ripen), open (dilate), and thin out. Labor is usually not induced before 39 weeks of pregnancy unless there is a medical reason to do so. When induction is needed for medical reasons, the benefits generally outweigh the risks. Talk with your health care provider about which methods of labor induction are right for you. This information is not intended to replace advice given to you by your health care provider. Make sure you discuss any questions you have with your health care provider. Document Revised: 12/06/2019 Document Reviewed: 12/06/2019 Elsevier Patient Education  2022 [employer].

## 2021-02-13 NOTE — Progress Notes (Signed)
Routine Prenatal Care Visit  Subjective  Alexis Gutierrez is a 30 y.o. G3P2002 at [redacted]w[redacted]d being seen today for ongoing prenatal care.  She is currently monitored for the following issues for this high-risk pregnancy and has Ovarian cyst affecting pregnancy in first trimester, antepartum; History of learning disability; Pelvic pain affecting pregnancy; Hives; Supervision of high risk pregnancy, antepartum; Obesity affecting pregnancy; Anxiety; and Recurrent major depressive disorder (HCC) on their problem list.  ----------------------------------------------------------------------------------- Patient reports no complaints.   Contractions: Not present. Vag. Bleeding: None.  Movement: Present. Denies leaking of fluid.  ----------------------------------------------------------------------------------- The following portions of the patient's history were reviewed and updated as appropriate: allergies, current medications, past family history, past medical history, past social history, past surgical history and problem list. Problem list updated.   Objective  Blood pressure 122/72, height 5' (1.524 m), weight (!) 313 lb (142 kg), last menstrual period 05/16/2020, unknown if currently breastfeeding. Pregravid weight 320 lb (145.2 kg) Total Weight Gain -7 lb (-3.175 kg) Urinalysis:      Fetal Status: Fetal Heart Rate (bpm): 140   Movement: Present  Presentation: Vertex  General:  Alert, oriented and cooperative. Patient is in no acute distress.  Skin: Skin is warm and dry. No rash noted.   Cardiovascular: Normal heart rate noted  Respiratory: Normal respiratory effort, no problems with respiration noted  Abdomen: Soft, gravid, appropriate for gestational age. Pain/Pressure: Absent     Pelvic:  Cervical exam performed Dilation: 1 Effacement (%): 50 Station: -3  Extremities: Normal range of motion.  Edema: None  Mental Status: Normal mood and affect. Normal behavior. Normal judgment and thought  content.     Assessment   30 y.o. [id number] at [redacted]w[redacted]d by  02/20/2021, by Last Menstrual Period presenting for routine prenatal visit  Plan   pregnancy3  Problems (from 05/14/20 to present)     Problem Noted Resolved   Obesity affecting pregnancy 08/05/2020 by Nadara Mustard, MD No   Overview Signed 08/05/2020  9:55 AM by Nadara Mustard, MD    BMI >=40 [x ] early 1h gtt -  [x ] screen sleep apnea [x ] anesthesia consult (early and late if BMI > 45) [x ] u/s for dating [x ]  [x ] nutritional goals [x ] folic acid 1mg  [x ] bASA (>12 weeks) [ ]  consider nutrition consult [ ]  consider maternal EKG 1st trimester [x ] Growth u/s 28 [ ] , 32 [ ] , 36 weeks [ ]  [x ] NST/AFI weekly 34+ weeks (34[] ,35[] ,36[] , 37[] , 38[] , 39[] , 40[] ) [ ]  IOL by 41 weeks (scheduled, prn [] )       Supervision of high risk pregnancy, antepartum 07/08/2020 by , MD No   Overview Addendum 12/18/2020 10:19 AM by , CNM     Nursing Staff Provider  Office Location  [facility] Dating  LMP  Language  English Anatomy  MFM- complete, normal  Flu Vaccine   Genetic Screen  NIPS:nml XX   TDaP vaccine   12/18/20 Hgb A1C or  GTT Early :116 Third trimester :   Covid  vaccinated   LAB RESULTS   Rhogam  Not needed Blood Type A/Positive/-- (05/03 1603)   Feeding Plan Breast/bottle Antibody Negative (05/03 1603)  Contraception IUD- Mirena Rubella 3.47 (05/03 1603)  Circumcision  RPR Non Reactive (05/03 1603)   Pediatrician   HBsAg Negative (05/03 1603)   Support Person  HIV Non Reactive (05/03 1603)  Prenatal Classes  Varicella Immune  GBS  (For PCN allergy, check sensitivities)   BTL Consent     VBAC Consent  Pap  2022, NILm HPV+ (16/18 neg), Pap 01/2019, ASCUS, HPV neg    Hgb Electro      CF      SMA     Hep C neg  Obesity: Pregravid BMI: 62            IOL planned for 02/16/2021- orders placed  NST: 140 bpm baseline, moderate variability, 15x15 accelerations, no  decelerations. Bedside AFI 11 cm  Gestational age appropriate obstetric precautions including but not limited to vaginal bleeding, contractions, leaking of fluid and fetal movement were reviewed in detail with the patient.    Return if symptoms worsen or fail to improve.  Natale Milch MD [facility], [facility] 02/13/2021, 10:23 AM

## 2021-02-14 LAB — SARS CORONAVIRUS 2 (TAT 6-24 HRS): SARS Coronavirus 2: NEGATIVE

## 2021-02-16 ENCOUNTER — Encounter: Payer: Self-pay | Admitting: Obstetrics and Gynecology

## 2021-02-16 ENCOUNTER — Inpatient Hospital Stay
Admission: RE | Admit: 2021-02-16 | Discharge: 2021-02-18 | DRG: 807 | Disposition: A | Discharge status: Home / Self Care | Payer: Medicaid Other | Attending: Obstetrics and Gynecology | Admitting: Obstetrics and Gynecology

## 2021-02-16 ENCOUNTER — Inpatient Hospital Stay: Payer: Medicaid Other | Admitting: Anesthesiology

## 2021-02-16 ENCOUNTER — Other Ambulatory Visit: Payer: Self-pay

## 2021-02-16 DIAGNOSIS — Z3A39 39 weeks gestation of pregnancy: Secondary | ICD-10-CM

## 2021-02-16 DIAGNOSIS — Z87891 Personal history of nicotine dependence: Secondary | ICD-10-CM

## 2021-02-16 DIAGNOSIS — O98813 Other maternal infectious and parasitic diseases complicating pregnancy, third trimester: Secondary | ICD-10-CM

## 2021-02-16 DIAGNOSIS — B951 Streptococcus, group B, as the cause of diseases classified elsewhere: Secondary | ICD-10-CM

## 2021-02-16 DIAGNOSIS — O99824 Streptococcus B carrier state complicating childbirth: Secondary | ICD-10-CM | POA: Diagnosis present

## 2021-02-16 DIAGNOSIS — Z880 Allergy status to penicillin: Secondary | ICD-10-CM | POA: Diagnosis not present

## 2021-02-16 DIAGNOSIS — O99343 Other mental disorders complicating pregnancy, third trimester: Secondary | ICD-10-CM

## 2021-02-16 DIAGNOSIS — O99213 Obesity complicating pregnancy, third trimester: Secondary | ICD-10-CM

## 2021-02-16 DIAGNOSIS — Z3490 Encounter for supervision of normal pregnancy, unspecified, unspecified trimester: Secondary | ICD-10-CM | POA: Diagnosis present

## 2021-02-16 DIAGNOSIS — F419 Anxiety disorder, unspecified: Secondary | ICD-10-CM

## 2021-02-16 DIAGNOSIS — E669 Obesity, unspecified: Secondary | ICD-10-CM

## 2021-02-16 DIAGNOSIS — O0990 Supervision of high risk pregnancy, unspecified, unspecified trimester: Secondary | ICD-10-CM

## 2021-02-16 DIAGNOSIS — O99214 Obesity complicating childbirth: Principal | ICD-10-CM | POA: Diagnosis present

## 2021-02-16 DIAGNOSIS — O26893 Other specified pregnancy related conditions, third trimester: Secondary | ICD-10-CM | POA: Diagnosis present

## 2021-02-16 DIAGNOSIS — O99344 Other mental disorders complicating childbirth: Secondary | ICD-10-CM | POA: Diagnosis not present

## 2021-02-16 DIAGNOSIS — F32A Depression, unspecified: Secondary | ICD-10-CM

## 2021-02-16 LAB — COMPREHENSIVE METABOLIC PANEL
ALT: 17 U/L (ref 0–44)
AST: 16 U/L (ref 15–41)
Albumin: 2.7 g/dL — ABNORMAL LOW (ref 3.5–5.0)
Alkaline Phosphatase: 96 U/L (ref 38–126)
Anion gap: 8 (ref 5–15)
BUN: 10 mg/dL (ref 6–20)
CO2: 17 mmol/L — ABNORMAL LOW (ref 22–32)
Calcium: 8.8 mg/dL — ABNORMAL LOW (ref 8.9–10.3)
Chloride: 109 mmol/L (ref 98–111)
Creatinine, Ser: 0.49 mg/dL (ref 0.44–1.00)
GFR, Estimated: >60 mL/min (ref >60)
Glucose, Bld: 161 mg/dL — ABNORMAL HIGH (ref 70–99)
Potassium: 3.5 mmol/L (ref 3.5–5.1)
Sodium: 134 mmol/L — ABNORMAL LOW (ref 135–145)
Total Bilirubin: 0.5 mg/dL (ref 0.3–1.2)
Total Protein: 6 g/dL — ABNORMAL LOW (ref 6.5–8.1)

## 2021-02-16 LAB — CBC
HCT: 33.6 % — ABNORMAL LOW (ref 36.0–46.0)
Hemoglobin: 10.9 g/dL — ABNORMAL LOW (ref 12.0–15.0)
MCH: 25.9 pg — ABNORMAL LOW (ref 26.0–34.0)
MCHC: 32.4 g/dL (ref 30.0–36.0)
MCV: 79.8 fL — ABNORMAL LOW (ref 80.0–100.0)
Platelets: 192 10*3/uL (ref 150–400)
RBC: 4.21 MIL/uL (ref 3.87–5.11)
RDW: 17.2 % — ABNORMAL HIGH (ref 11.5–15.5)
WBC: 7.6 10*3/uL (ref 4.0–10.5)
nRBC: 0 % (ref 0.0–0.2)

## 2021-02-16 LAB — PROTEIN / CREATININE RATIO, URINE
Creatinine, Urine: 101 mg/dL
Protein Creatinine Ratio: 0.09 mg/mg{Cre} (ref 0.00–0.15)
Total Protein, Urine: 9 mg/dL

## 2021-02-16 LAB — TYPE AND SCREEN
ABO/RH(D): A POS
Antibody Screen: NEGATIVE

## 2021-02-16 MED ORDER — LACTATED RINGERS IV SOLN: 500.0000 mL | Freq: Once | INTRAVENOUS | Status: DC

## 2021-02-16 MED ORDER — LIDOCAINE HCL (PF) 1 % IJ SOLN
30.0000 mL | INTRAMUSCULAR | Status: DC | PRN
  Filled 2021-02-16: qty 30

## 2021-02-16 MED ORDER — AMMONIA AROMATIC IN INHA
RESPIRATORY_TRACT | Status: AC
  Filled 2021-02-16: qty 10

## 2021-02-16 MED ORDER — FENTANYL-BUPIVACAINE-NACL 0.5-0.125-0.9 MG/250ML-% EP SOLN
12.0000 mL/h | EPIDURAL | Status: DC | PRN
  Administered 2021-02-16: 12 mL/h via EPIDURAL
  Filled 2021-02-16: qty 250

## 2021-02-16 MED ORDER — EPHEDRINE 5 MG/ML INJ
INTRAVENOUS | Status: AC
  Filled 2021-02-16: qty 4

## 2021-02-16 MED ORDER — OXYTOCIN-SODIUM CHLORIDE 30-0.9 UT/500ML-% IV SOLN
INTRAVENOUS | Status: AC
  Filled 2021-02-16: qty 1000

## 2021-02-16 MED ORDER — LIDOCAINE-EPINEPHRINE (PF) 1.5 %-1:200000 IJ SOLN
INTRAMUSCULAR | Status: DC | PRN
  Administered 2021-02-16: 3 mL via EPIDURAL

## 2021-02-16 MED ORDER — VANCOMYCIN HCL IN DEXTROSE 1-5 GM/200ML-% IV SOLN
1000.0000 mg | Freq: Two times a day (BID) | INTRAVENOUS | Status: DC
  Administered 2021-02-16 – 2021-02-17 (×2): 1000 mg via INTRAVENOUS
  Filled 2021-02-16 (×3): qty 200

## 2021-02-16 MED ORDER — MISOPROSTOL 25 MCG QUARTER TABLET
25.0000 ug | ORAL_TABLET | ORAL | Status: DC | PRN
  Administered 2021-02-16 (×2): 25 ug via VAGINAL
  Filled 2021-02-16 (×3): qty 1

## 2021-02-16 MED ORDER — ACETAMINOPHEN 325 MG PO TABS: 650.0000 mg | ORAL_TABLET | ORAL | Status: DC | PRN

## 2021-02-16 MED ORDER — ONDANSETRON HCL 4 MG/2ML IJ SOLN: 4.0000 mg | Freq: Four times a day (QID) | INTRAMUSCULAR | Status: DC | PRN

## 2021-02-16 MED ORDER — MISOPROSTOL 25 MCG QUARTER TABLET
25.0000 ug | ORAL_TABLET | Freq: Once | ORAL | Status: DC
  Filled 2021-02-16: qty 1

## 2021-02-16 MED ORDER — LACTATED RINGERS IV SOLN
500.0000 mL | INTRAVENOUS | Status: DC | PRN
  Administered 2021-02-16: 1000 mL via INTRAVENOUS
  Administered 2021-02-17: 500 mL via INTRAVENOUS

## 2021-02-16 MED ORDER — EPHEDRINE 5 MG/ML INJ
10.0000 mg | INTRAVENOUS | Status: DC | PRN
  Filled 2021-02-16: qty 2

## 2021-02-16 MED ORDER — MISOPROSTOL 200 MCG PO TABS
ORAL_TABLET | ORAL | Status: AC
  Filled 2021-02-16: qty 4

## 2021-02-16 MED ORDER — FENTANYL-BUPIVACAINE-NACL 0.5-0.125-0.9 MG/250ML-% EP SOLN
EPIDURAL | Status: AC
  Filled 2021-02-16: qty 250

## 2021-02-16 MED ORDER — LIDOCAINE HCL (PF) 1 % IJ SOLN
INTRAMUSCULAR | Status: DC | PRN
  Administered 2021-02-16: 3 mL via SUBCUTANEOUS

## 2021-02-16 MED ORDER — PHENYLEPHRINE 40 MCG/ML (10ML) SYRINGE FOR IV PUSH (FOR BLOOD PRESSURE SUPPORT)
80.0000 ug | PREFILLED_SYRINGE | INTRAVENOUS | Status: DC | PRN
  Filled 2021-02-16: qty 10

## 2021-02-16 MED ORDER — OXYTOCIN-SODIUM CHLORIDE 30-0.9 UT/500ML-% IV SOLN
1.0000 m[IU]/min | INTRAVENOUS | Status: DC
Start: 2021-02-16 — End: 2021-02-17
  Administered 2021-02-17: 2 m[IU]/min via INTRAVENOUS

## 2021-02-16 MED ORDER — MISOPROSTOL 25 MCG QUARTER TABLET
ORAL_TABLET | ORAL | Status: AC
  Filled 2021-02-16: qty 1

## 2021-02-16 MED ORDER — TERBUTALINE SULFATE 1 MG/ML IJ SOLN: 0.2500 mg | Freq: Once | INTRAMUSCULAR | Status: DC | PRN

## 2021-02-16 MED ORDER — SODIUM CHLORIDE 0.9 % IV SOLN
INTRAVENOUS | Status: DC | PRN
  Administered 2021-02-16: 10 mL via EPIDURAL

## 2021-02-16 MED ORDER — OXYTOCIN-SODIUM CHLORIDE 30-0.9 UT/500ML-% IV SOLN: 2.5000 [IU]/h | INTRAVENOUS | Status: DC

## 2021-02-16 MED ORDER — LACTATED RINGERS IV SOLN: INTRAVENOUS | Status: DC

## 2021-02-16 MED ORDER — DIPHENHYDRAMINE HCL 50 MG/ML IJ SOLN: 12.5000 mg | INTRAMUSCULAR | Status: DC | PRN

## 2021-02-16 MED ORDER — OXYTOCIN 10 UNIT/ML IJ SOLN
INTRAMUSCULAR | Status: AC
  Filled 2021-02-16: qty 2

## 2021-02-16 MED ORDER — LIDOCAINE HCL (PF) 1 % IJ SOLN
INTRAMUSCULAR | Status: AC
  Filled 2021-02-16: qty 30

## 2021-02-16 MED ORDER — OXYTOCIN BOLUS FROM INFUSION
333.0000 mL | Freq: Once | INTRAVENOUS | Status: AC
  Administered 2021-02-17: 333 mL via INTRAVENOUS

## 2021-02-16 MED ORDER — SOD CITRATE-CITRIC ACID 500-334 MG/5ML PO SOLN: 30.0000 mL | ORAL | Status: DC | PRN

## 2021-02-16 MED ORDER — BUTORPHANOL TARTRATE 2 MG/ML IJ SOLN
2.0000 mg | INTRAMUSCULAR | Status: DC | PRN
  Administered 2021-02-16: 2 mg via INTRAVENOUS
  Filled 2021-02-16: qty 2

## 2021-02-16 NOTE — Anesthesia Procedure Notes (Signed)
Epidural Patient location during procedure: OB Start time: 02/16/2021 11:10 PM End time: 02/16/2021 11:33 PM  Staffing Anesthesiologist: Corinda Gubler, MD Performed: anesthesiologist   Preanesthetic Checklist Completed: patient identified, IV checked, site marked, risks and benefits discussed, surgical consent, monitors and equipment checked, pre-op evaluation and timeout performed  Epidural Patient position: sitting Prep: ChloraPrep Patient monitoring: heart rate, continuous pulse ox and blood pressure Approach: midline Location: L3-L4 Injection technique: LOR saline  Needle:  Needle type: Tuohy  Needle gauge: 17 G Needle length: 15 cm and 9 Needle insertion depth: 12 cm Catheter type: closed end flexible Catheter size: 19 Gauge Catheter at skin depth: 18 cm Test dose: negative and 1.5% lidocaine with Epi 1:200 K  Assessment Sensory level: T10 Events: blood not aspirated, injection not painful, no injection resistance, no paresthesia and negative IV test  Additional Notes first attempt - difficult epidural due to body habitus Pt. Evaluated and documentation done after procedure finished. Patient identified. Risks/Benefits/Options discussed with patient including but not limited to bleeding, infection, nerve damage, paralysis, failed block, incomplete pain control, headache, blood pressure changes, nausea, vomiting, reactions to medication both or allergic, itching and postpartum back pain. Confirmed with bedside nurse the patient's most recent platelet count. Confirmed with patient that they are not currently taking any anticoagulation, have any bleeding history or any family history of bleeding disorders. Patient expressed understanding and wished to proceed. All questions were answered. Sterile technique was used throughout the entire procedure. Please see nursing notes for vital signs. Test dose was given through epidural catheter and negative prior to continuing to dose  epidural or start infusion. Warning signs of high block given to the patient including shortness of breath, tingling/numbness in hands, complete motor block, or any concerning symptoms with instructions to call for help. Patient was given instructions on fall risk and not to get out of bed. All questions and concerns addressed with instructions to call with any issues or inadequate analgesia.     Patient tolerated the insertion well without immediate complications.  Reason for block: procedure for painReason for block:procedure for pain

## 2021-02-16 NOTE — Anesthesia Preprocedure Evaluation (Signed)
Anesthesia Evaluation  Patient identified by MRN, date of birth, ID band Patient awake    Reviewed: Allergy & Precautions, NPO status , [name] Chart, lab work & pertinent test results  History of Anesthesia Complications Negative for: history of anesthetic complications  Airway Mallampati: III  TM Distance: >3 FB Neck ROM: Full    Dental no notable dental hx. (+) Teeth Intact   Pulmonary asthma , neg sleep apnea, neg COPD, Patient abstained from smoking.Not current smoker, former smoker,    Pulmonary exam normal breath sounds clear to auscultation       Cardiovascular Exercise Tolerance: Good METS(-) hypertension(-) CAD and (-) Past MI negative cardio ROS  (-) dysrhythmias  Rhythm:Regular Rate:Normal - Systolic murmurs    Neuro/Psych PSYCHIATRIC DISORDERS Anxiety Depression negative neurological ROS     GI/Hepatic neg GERD  ,(+)     (-) substance abuse  ,   Endo/Other  neg diabetesMorbid obesity  Renal/GU negative Renal ROS     Musculoskeletal   Abdominal (+) + obese,   Peds  Hematology   Anesthesia Other Findings Past Medical History: No date: Anxiety No date: Asthma No date: Depression 12/27/2017: Supervision of high risk pregnancy, antepartum     Comment:  Clinic [facility] Prenatal Labs Dating 8wk5d               ultrasound Blood type: --/--/A POS Performed at [facility], [address] [place], [place] [address]              [phone]/07 1943)  Genetic Screen  NIPS: Normal               XY Antibody:Negative (10/10 1003) Anatomic [place] Complete at              23 weeks  Rubella: 3.70 (10/10 1003) Varicella: Immune               GTT Early:  91             Third trimester: 127 RPR: Non               Reactive (02/20 0954)  Rhoga  Reproductive/Obstetrics (+) Pregnancy                             Anesthesia Physical Anesthesia Plan  ASA: 3  Anesthesia Plan:  Epidural   Post-op Pain Management: Epidural   Induction:   PONV Risk Score and Plan: 2 and Treatment may vary due to age or medical condition and Ondansetron  Airway Management Planned: Natural Airway  Additional Equipment:   Intra-op Plan:   Post-operative Plan:   Informed Consent: I have reviewed the patients History and Physical, chart, labs and discussed the procedure including the risks, benefits and alternatives for the proposed anesthesia with the patient or authorized representative who has indicated his/her understanding and acceptance.       Plan Discussed with: Surgeon  Anesthesia Plan Comments: (Discussed R/B/A of neuraxial anesthesia technique with patient: - rare risks of spinal/epidural hematoma, nerve damage, infection - Risk of PDPH - Risk of itching - Risk of nausea and vomiting - Risk of poor block necessitating replacement of epidural. - Risk of allergic reactions. Patient informed about increased incidence of perioperative risk due to high BMI. Patient understands.  Patient voiced understanding.)        Anesthesia Quick Evaluation

## 2021-02-16 NOTE — Progress Notes (Signed)
  Labor Progress Note   30 y.o. B3A1937 @ [redacted]w[redacted]d , admitted for  Pregnancy, Labor Management. IOL  Subjective:  Patient reports crampy contractions. Reviewed pain relief options.  Objective:  BP (!) 128/52 (BP Location: Left Arm)   Pulse 97   Temp 99 F (37.2 C) (Oral)   Resp 18   Ht 5' (1.524 m)   Wt (!) 143.3 kg   LMP 05/16/2020   BMI 61.71 kg/m  Abd: gravid, ND, FHT present, mild tenderness on exam Extr: trace to 1+ bilateral pedal edema SVE: CERVIX: 3.5 cm dilated, 70 effaced, -2, -3 station Cervical sweep  EFM: FHR: 135 bpm, variability: moderate,  accelerations:  Present,  decelerations:  Absent Toco: Frequency: Every 2-4 minutes Labs: I have reviewed the patient's lab results.   Assessment & Plan:  [id number] @ [redacted]w[redacted]d, admitted for  Pregnancy and Labor/Delivery Management  1. Pain management:  IV analgesia per request, planning epidural . 2. FWB: FHT category I.  3. ID: GBS positive: start vancomycin prophylaxis 4. Labor management: start pitocin titration with spacing of contractions, consider AROM/internal monitors  All discussed with patient, see orders   Tresea Mall, CNM [facility] 02/16/2021  7:29 PM

## 2021-02-16 NOTE — H&P (Signed)
OB History & Physical   History of Present Illness:  Chief Complaint: induction of labor  HPI:  Alexis Gutierrez is a 30 y.o. G47P2002 female at [redacted]w[redacted]d dated by LMP.  Her pregnancy has been complicated by obesity/BMI 61, anxiety, depression, pelvic pain, history of learning disability .    She denies contractions.   She denies leakage of fluid.   She denies vaginal bleeding.   She reports fetal movement.    Total weight gain for pregnancy: -1.814 kg   Obstetrical Problem List: pregnancy3  Problems (from 05/14/20 to present)     Problem Noted Resolved   Obesity affecting pregnancy 08/05/2020 by Gae Dry, MD No   Overview Signed 08/05/2020  9:55 AM by Gae Dry, MD    BMI >=40 [x ] early 1h gtt -  [x ] screen sleep apnea [x ] anesthesia consult (early and late if BMI > 45) [x ] u/s for dating [x ]  [x ] nutritional goals [x ] folic acid 1mg  [x ] bASA (>12 weeks) [ ]  consider nutrition consult [ ]  consider maternal EKG 1st trimester [x ] Growth u/s 28 [ ] , 32 [ ] , 36 weeks [ ]  [x ] NST/AFI weekly 34+ weeks (34[] ,35[] ,36[] , 37[] , 38[] , 39[] , 40[] ) [ ]  IOL by 41 weeks (scheduled, prn [] )       Supervision of high risk pregnancy, antepartum 07/08/2020 by Homero Fellers, MD No   Overview Addendum 02/13/2021  9:24 AM by Homero Fellers, MD     Nursing Staff Provider  Office Location  [facility] Dating  LMP  Language  English Anatomy US  MFM- complete, normal  Flu Vaccine   Genetic Screen  NIPS:nml XX   TDaP vaccine   12/18/20 Hgb A1C or  GTT Early :116 Third trimester : 130  Covid  vaccinated   LAB RESULTS   Rhogam  Not needed Blood Type A/Positive/-- (05/03 1603)   Feeding Plan Breast/bottle Antibody Negative (05/03 1603)  Contraception IUD- Mirena Rubella 3.47 (05/03 1603)  Circumcision  RPR Non Reactive (05/03 1603)   Pediatrician   HBsAg Negative (05/03 1603)   Support Person  HIV Non Reactive (05/03 1603)  Prenatal Classes  Varicella Immune    GBS   positive, clinda resistant  BTL Consent     VBAC Consent  Pap  2022, NILm HPV+ (16/18 neg), Pap 01/2019, ASCUS, HPV neg    Hgb Electro      CF      SMA     Hep C neg  Obesity: Pregravid BMI: 93            Maternal Medical History:   Past Medical History:  Diagnosis Date   Anxiety    Asthma    Depression    Supervision of high risk pregnancy, antepartum 12/27/2017   [facility]inic [facility] Prenatal Labs Dating 8wk5d ultrasound Blood type: --/--/A POS Performed at [facility], [address] [place], [place] [address]  [phone]/07 1943)  Genetic Screen  NIPS: Normal XY Antibody:Negative (10/10 1003) Anatomic [place] Complete at 23 weeks  Rubella: 3.70 (10/10 1003) Varicella: Immune GTT Early:  91             Third trimester: 127 RPR: Non Reactive (02/20 0954)  Rhoga    Past Surgical History:  Procedure Laterality Date   TONSILLECTOMY      Allergies  Allergen Reactions   Amoxicillin Shortness Of Breath    Has patient had a PCN reaction causing immediate rash,  facial/tongue/throat swelling, SOB or lightheadedness with hypotension: Yes Has patient had a PCN reaction causing severe rash involving mucus membranes or skin necrosis: No Has patient had a PCN reaction that required hospitalization: No Has patient had a PCN reaction occurring within the last 10 years: No If all of the above answers are "NO", then may proceed with Cephalosporin use.   Eggs Or Egg-Derived Products Other (See Comments)    Acid reflux   Penicillins Swelling    Has patient had a PCN reaction causing immediate rash, facial/tongue/throat swelling, SOB or lightheadedness with hypotension: Yes Has patient had a PCN reaction causing severe rash involving mucus membranes or skin necrosis: No Has patient had a PCN reaction that required hospitalization: No Has patient had a PCN reaction occurring within the last 10 years: No If all of the above answers are "NO", then may proceed with Cephalosporin use.    Tomato  Other (See Comments)    Acid reflux     Prior to Admission medications   Medication Sig Start Date End Date Taking? Authorizing Provider  aspirin 81 MG chewable tablet Chew 81 mg by mouth daily.   Yes [provider]  Prenat-Fe Poly-Methfol-FA-DHA (VITAFOL FE+) 90-0.6-0.4-200 MG CAPS Take 1 tablet by mouth daily. 11/17/20  Yes Schuman, Stefanie Libel, MD  Doxylamine-Pyridoxine (DICLEGIS) 10-10 MG TBEC Start with 2 tabs PO QHS, if needed on day 3, add 1 tab PO in AM, if needed on day 4, add a 4th tablet PO in afternoon Patient not taking: Reported on 01/14/2021 07/22/20   Will Bonnet, MD    OB History  Gravida Para Term Preterm AB Living  3 2 2  0 0 2  SAB IAB Ectopic Multiple Live Births        0 2    # Outcome Date GA Lbr Len/2nd Weight Sex Delivery Anes PTL Lv  3 Current           2 Term 07/25/18 [redacted]w[redacted]d 03:20 / 01:25 3170 g M Vag-Spont EPI  LIV  1 Term 02/10/11 [redacted]w[redacted]d  3374 g F Vag-Spont   LIV    Prenatal care site: [facility]  Social History: She  reports that she quit smoking about 8 months ago. Her smoking use included cigarettes. She has never used smokeless tobacco. She reports that she does not currently use drugs. She reports that she does not drink alcohol.  Family History: family history includes Breast cancer in her paternal aunt.    Review of Systems:  Review of Systems  Constitutional:  Negative for chills and fever.  HENT:  Negative for congestion, ear discharge, ear pain, hearing loss, sinus pain and sore throat.   Eyes:  Negative for blurred vision and double vision.  Respiratory:  Negative for cough, shortness of breath and wheezing.   Cardiovascular:  Negative for chest pain, palpitations and leg swelling.  Gastrointestinal:  Negative for abdominal pain, blood in stool, constipation, diarrhea, heartburn, melena, nausea and vomiting.  Genitourinary:  Negative for dysuria, flank pain, frequency, hematuria and urgency.  Musculoskeletal:  Negative  for back pain, joint pain and myalgias.  Skin:  Negative for itching and rash.  Neurological:  Negative for dizziness, tingling, tremors, sensory change, speech change, focal weakness, seizures, loss of consciousness, weakness and headaches.  Endo/Heme/Allergies:  Negative for environmental allergies. Does not bruise/bleed easily.  Psychiatric/Behavioral:  Negative for depression, hallucinations, memory loss, substance abuse and suicidal ideas. The patient is not nervous/anxious and does not have insomnia.  Physical Exam:  BP (!) 138/102   Pulse 97   Temp 98.3 F (36.8 C) (Oral)   Resp 17   Ht 5' (1.524 m)   Wt (!) 143.3 kg   LMP 05/16/2020   BMI 61.71 kg/m   Constitutional: Well nourished, well developed female in no acute distress.  HEENT: normal Skin: Warm and dry.  Cardiovascular: Regular rate and rhythm.   Extremity:  trace edema   Respiratory: Clear to auscultation bilateral. Normal respiratory effort Abdomen: FHT present Back: no CVAT Neuro: DTRs 2+, Cranial nerves grossly intact Psych: Alert and Oriented x3. No memory deficits. Normal mood and affect.  MS: normal gait, normal bilateral lower extremity ROM/strength/stability.  Pelvic exam: per RN Huston Foley 1/50/-3 No change from office exam 3 days ago   Baseline FHR: 140 beats/min   Variability: moderate   Accelerations: present   Decelerations: absent Contractions: none seen Overall assessment: reassuring   Lab Results  Component Value Date   SARSCOV2NAA NEGATIVE 02/13/2021    Assessment:  Alexis Gutierrez is a 30 y.o. G25P2002 female at [redacted]w[redacted]d with induction of labor.   Plan:  Admit to Labor & Delivery  CBC, T&S, Clrs, IVF GBS positive: Allergic to penicillin/clindamycin resistant. Treat with vancomycin 1000 mg every 12 hours Fetal well-being: category I Induction started with cytotec 25 mcg cervical    Tresea Mall, CNM 02/16/2021 12:29 PM

## 2021-02-17 ENCOUNTER — Encounter: Payer: Self-pay | Admitting: Obstetrics and Gynecology

## 2021-02-17 DIAGNOSIS — O99214 Obesity complicating childbirth: Principal | ICD-10-CM

## 2021-02-17 DIAGNOSIS — E669 Obesity, unspecified: Secondary | ICD-10-CM

## 2021-02-17 DIAGNOSIS — B951 Streptococcus, group B, as the cause of diseases classified elsewhere: Secondary | ICD-10-CM

## 2021-02-17 DIAGNOSIS — O99344 Other mental disorders complicating childbirth: Secondary | ICD-10-CM

## 2021-02-17 DIAGNOSIS — F419 Anxiety disorder, unspecified: Secondary | ICD-10-CM

## 2021-02-17 DIAGNOSIS — O98813 Other maternal infectious and parasitic diseases complicating pregnancy, third trimester: Secondary | ICD-10-CM

## 2021-02-17 DIAGNOSIS — Z3A39 39 weeks gestation of pregnancy: Secondary | ICD-10-CM

## 2021-02-17 DIAGNOSIS — F32A Depression, unspecified: Secondary | ICD-10-CM

## 2021-02-17 LAB — RPR: RPR Ser Ql: NONREACTIVE

## 2021-02-17 MED ORDER — ONDANSETRON HCL 4 MG/2ML IJ SOLN: 4.0000 mg | INTRAMUSCULAR | Status: DC | PRN

## 2021-02-17 MED ORDER — DIBUCAINE (PERIANAL) 1 % EX OINT: 1.0000 "application " | TOPICAL_OINTMENT | CUTANEOUS | Status: DC | PRN

## 2021-02-17 MED ORDER — GENTAMICIN SULFATE 40 MG/ML IJ SOLN
1.5000 mg/kg | Freq: Once | INTRAVENOUS | Status: AC
  Administered 2021-02-17: 210 mg via INTRAVENOUS
  Filled 2021-02-17: qty 5.25

## 2021-02-17 MED ORDER — WITCH HAZEL-GLYCERIN EX PADS: 1.0000 "application " | MEDICATED_PAD | CUTANEOUS | Status: DC | PRN

## 2021-02-17 MED ORDER — ZOLPIDEM TARTRATE 5 MG PO TABS: 5.0000 mg | ORAL_TABLET | Freq: Every evening | ORAL | Status: DC | PRN

## 2021-02-17 MED ORDER — DOCUSATE SODIUM 100 MG PO CAPS
100.0000 mg | ORAL_CAPSULE | Freq: Two times a day (BID) | ORAL | Status: DC
  Administered 2021-02-17 – 2021-02-18 (×2): 100 mg via ORAL
  Filled 2021-02-17 (×2): qty 1

## 2021-02-17 MED ORDER — METHYLERGONOVINE MALEATE 0.2 MG/ML IJ SOLN
INTRAMUSCULAR | Status: AC
  Filled 2021-02-17: qty 1

## 2021-02-17 MED ORDER — SIMETHICONE 80 MG PO CHEW: 80.0000 mg | CHEWABLE_TABLET | ORAL | Status: DC | PRN

## 2021-02-17 MED ORDER — CLINDAMYCIN PHOSPHATE 900 MG/50ML IV SOLN
900.0000 mg | Freq: Once | INTRAVENOUS | Status: AC
  Administered 2021-02-17: 900 mg via INTRAVENOUS
  Filled 2021-02-17: qty 50

## 2021-02-17 MED ORDER — IBUPROFEN 600 MG PO TABS
600.0000 mg | ORAL_TABLET | Freq: Four times a day (QID) | ORAL | Status: DC
  Administered 2021-02-17 – 2021-02-18 (×4): 600 mg via ORAL
  Filled 2021-02-17 (×4): qty 1

## 2021-02-17 MED ORDER — ONDANSETRON HCL 4 MG PO TABS: 4.0000 mg | ORAL_TABLET | ORAL | Status: DC | PRN

## 2021-02-17 MED ORDER — ACETAMINOPHEN 500 MG PO TABS
1000.0000 mg | ORAL_TABLET | Freq: Four times a day (QID) | ORAL | Status: DC
  Administered 2021-02-17 – 2021-02-18 (×4): 1000 mg via ORAL
  Filled 2021-02-17 (×4): qty 2

## 2021-02-17 MED ORDER — SODIUM CHLORIDE 0.9 % IV SOLN: INTRAVENOUS | Status: DC | PRN

## 2021-02-17 MED ORDER — DIPHENHYDRAMINE HCL 25 MG PO CAPS: 25.0000 mg | ORAL_CAPSULE | Freq: Four times a day (QID) | ORAL | Status: DC | PRN

## 2021-02-17 MED ORDER — BENZOCAINE-MENTHOL 20-0.5 % EX AERO: 1.0000 "application " | INHALATION_SPRAY | CUTANEOUS | Status: DC | PRN

## 2021-02-17 MED ORDER — PRENATAL MULTIVITAMIN CH
1.0000 | ORAL_TABLET | Freq: Every day | ORAL | Status: DC
  Administered 2021-02-17 – 2021-02-18 (×2): 1 via ORAL
  Filled 2021-02-17 (×2): qty 1

## 2021-02-17 MED ORDER — METHYLERGONOVINE MALEATE 0.2 MG/ML IJ SOLN
0.2000 mg | Freq: Once | INTRAMUSCULAR | Status: AC
  Administered 2021-02-17: 0.2 mg via INTRAMUSCULAR

## 2021-02-17 MED ORDER — COCONUT OIL OIL: 1.0000 "application " | TOPICAL_OIL | Status: DC | PRN

## 2021-02-17 NOTE — Progress Notes (Signed)
RN to notify anesthesia to come assess patient's epidural, as patient isn't getting any relief after several position changes and using her bolus button. Pt rates pain 9/10 on pain scale. Anesthesia made aware and reports that they will send someone up to assess. Will continue to monitor.

## 2021-02-17 NOTE — Progress Notes (Signed)
  Labor Progress Note   30 y.o. [id number] @ [redacted]w[redacted]d , admitted for  Pregnancy, Labor Management. IOL  Subjective:  Comfortable with epidural  Objective:  BP 116/74 (BP Location: Right Wrist)   Pulse 87   Temp 98.5 F (36.9 C) (Oral)   Resp 16   Ht 5' (1.524 m)   Wt (!) 143.3 kg   LMP 05/16/2020   SpO2 100%   BMI 61.71 kg/m  Abd: gravid, ND, FHT present, mild tenderness on exam Extr: trace to 1+ bilateral pedal edema SVE: CERVIX: 6 cm dilated, 80 effaced, -2 station AROM moderate clear/pink/small bright red blood IUPC and FSE placed  EFM: FHR: 125 bpm, variability: moderate,  accelerations:  Present,  decelerations:  Absent Toco: Frequency: Every 2-3 minutes Labs: I have reviewed the patient's lab results.   Assessment & Plan:  [id number] @ [redacted]w[redacted]d, admitted for  Pregnancy and Labor/Delivery Management  1. Pain management: epidural. 2. FWB: FHT category I.  3. ID: GBS positive: vancomycin prophylaxis 4. Labor management: continue pitocin titration  All discussed with patient, see orders   Tresea Mall, CNM [facility] 02/17/2021  6:36 AM

## 2021-02-17 NOTE — Lactation Note (Signed)
This note was copied from a baby's chart. Lactation Consultation Note  Patient Name: Alexis Gutierrez LIDCV'U Date: 02/17/2021 Reason for consult: Initial assessment;Term Age:32 hours  Initial lactation visit. Mom is P3, SVD 3 hours ago. Mom, Dad and baby alert in room upon entry. Mom reports she wants to do breast and formula as she had breastfeeding difficulty with her other children (30yr, 36yr). Mom only breastfed while in the hospital with other children and would like continue to try to breastfeed but also give formula. LC and mom discussed a feeding plan to best support breastfeeding and follow mom's wishes of formula too. We discussed importance of stimulation and baby coming to the breast at every feeding prior to bottle to help with building/establishment of milk supply. Paced bottle feeding along with appropriate volume sizes for baby's tummy were reviewed and pointed out in the educational booklet.  LC offered support for breastfeeding- mom stated that she felt confident with getting baby to the breast but will call if she feels that she needs help. Whiteboard was updated with [facility] name and number.  Maternal Data Does the patient have breastfeeding experience prior to this delivery?: Yes How long did the patient breastfeed?: only in hospital  Feeding Mother's Current Feeding Choice: Breast Milk and Formula  LATCH Score                    Lactation Tools Discussed/Used Tools: Bottle  Interventions Interventions: Breast feeding basics reviewed;Education;Pace feeding  Discharge    Consult Status Consult Status: PRN    Danford Bad 02/17/2021, 2:38 PM

## 2021-02-18 LAB — CBC
HCT: 30.5 % — ABNORMAL LOW (ref 36.0–46.0)
Hemoglobin: 9.7 g/dL — ABNORMAL LOW (ref 12.0–15.0)
MCH: 25.2 pg — ABNORMAL LOW (ref 26.0–34.0)
MCHC: 31.8 g/dL (ref 30.0–36.0)
MCV: 79.2 fL — ABNORMAL LOW (ref 80.0–100.0)
Platelets: 170 10*3/uL (ref 150–400)
RBC: 3.85 MIL/uL — ABNORMAL LOW (ref 3.87–5.11)
RDW: 17.2 % — ABNORMAL HIGH (ref 11.5–15.5)
WBC: 9.8 10*3/uL (ref 4.0–10.5)
nRBC: 0 % (ref 0.0–0.2)

## 2021-02-18 MED ORDER — IBUPROFEN 600 MG PO TABS: 600.0000 mg | ORAL_TABLET | Freq: Four times a day (QID) | ORAL | 0 refills | Status: DC

## 2021-02-18 NOTE — Discharge Summary (Signed)
OB Discharge Summary     Patient Name: Alexis Gutierrez DOB: June 17, 1990 MRN: [id number]  Date of admission: 02/16/2021 Delivering MD: Adelene Idler R   Date of discharge: 02/18/2021  Admitting diagnosis: Encounter for induction of labor [Z34.90] Intrauterine pregnancy: [redacted]w[redacted]d     Secondary diagnosis:  Principal Problem:   Encounter for induction of labor  Additional problems: none     Discharge diagnosis: Term Pregnancy Delivered                                                                                                Post partum procedures: none  Augmentation: AROM, Pitocin, and Cytotec  Complications: None  Hospital course:  Induction of Labor With Vaginal Delivery   30 y.o. yo G3P3003 at [redacted]w[redacted]d was admitted to the hospital 02/16/2021 for induction of labor.  Indication for induction:  .bmi .  Patient had an uncomplicated labor course as follows: Membrane Rupture Time/Date: 6:28 AM ,02/17/2021   Delivery Method:Vaginal, Spontaneous  Episiotomy: None  Lacerations:  None  Details of delivery can be found in separate delivery note.  Patient had a routine postpartum course. Patient is discharged home 02/18/21.  Newborn Data: Birth date:02/17/2021  Birth time:10:59 AM  Gender:Female  Living status:Living  Apgars:9 ,9  Weight:2830 g   Physical exam  Vitals:   02/18/21 0015 02/18/21 0439 02/18/21 0506 02/18/21 0734  BP: 125/70 (!) 98/50 121/74 (!) 89/54  Pulse: 79 80 77 80  Resp: 20 18  20   Temp: 98.2 F (36.8 C) 99 F (37.2 C)  98.2 F (36.8 C)  TempSrc: Oral   Oral  SpO2: 94% 100%  100%  Weight:      Height:       General: alert, cooperative, and no distress Lochia: appropriate Uterine Fundus: firm DVT Evaluation: No evidence of DVT seen on physical exam. Labs: Lab Results  Component Value Date   WBC 9.8 02/18/2021   HGB 9.7 (L) 02/18/2021   HCT 30.5 (L) 02/18/2021   MCV 79.2 (L) 02/18/2021   PLT 170 02/18/2021   CMP Latest Ref Rng & Units  02/16/2021  Glucose 70 - 99 mg/dL 14/02/2021)  BUN 6 - 20 mg/dL 10  Creatinine 662(H - 4.76 mg/dL 5.46  Sodium 5.03 - 546 mmol/L 134(L)  Potassium 3.5 - 5.1 mmol/L 3.5  Chloride 98 - 111 mmol/L 109  CO2 22 - 32 mmol/L 17(L)  Calcium 8.9 - 10.3 mg/dL 568)  Total Protein 6.5 - 8.1 g/dL 6.0(L)  Total Bilirubin 0.3 - 1.2 mg/dL 0.5  Alkaline Phos 38 - 126 U/L 96  AST 15 - 41 U/L 16  ALT 0 - 44 U/L 17    Discharge instruction: per After Visit Summary and "Baby and Me Booklet".  After visit meds:  Allergies as of 02/18/2021       Reactions   Amoxicillin Shortness Of Breath   Has patient had a PCN reaction causing immediate rash, facial/tongue/throat swelling, SOB or lightheadedness with hypotension: Yes Has patient had a PCN reaction causing severe rash involving mucus membranes or skin necrosis: No Has patient had a  PCN reaction that required hospitalization: No Has patient had a PCN reaction occurring within the last 10 years: No If all of the above answers are "NO", then may proceed with Cephalosporin use.   Eggs Or Egg-derived Products Other (See Comments)   Acid reflux   Penicillins Swelling   Has patient had a PCN reaction causing immediate rash, facial/tongue/throat swelling, SOB or lightheadedness with hypotension: Yes Has patient had a PCN reaction causing severe rash involving mucus membranes or skin necrosis: No Has patient had a PCN reaction that required hospitalization: No Has patient had a PCN reaction occurring within the last 10 years: No If all of the above answers are "NO", then may proceed with Cephalosporin use.   Tomato Other (See Comments)   Acid reflux         Medication List     STOP taking these medications    aspirin 81 MG chewable tablet   Doxylamine-Pyridoxine 10-10 MG Tbec Commonly known as: Diclegis       TAKE these medications    ibuprofen 600 MG tablet Commonly known as: ADVIL Take 1 tablet (600 mg total) by mouth every 6 (six) hours.    Vitafol FE+ 90-0.6-0.4-200 MG Caps Take 1 tablet by mouth daily.        Diet: routine diet  Activity: Advance as tolerated. Pelvic rest for 6 weeks.   Outpatient follow up:6 weeks Follow up Appt:No future appointments. Follow up Visit:No follow-ups on file.  Postpartum contraception: IUD Mirena  Newborn Data: Live born female  Birth Weight: 6 lb 3.8 oz (2830 g) APGAR: 9, 9  Newborn Delivery   Birth date/time: 02/17/2021 10:59:00 Delivery type: Vaginal, Spontaneous      Baby Feeding:  breast and bottle Disposition:home with mother   02/18/2021 Vena Austria, MD

## 2021-02-18 NOTE — Progress Notes (Signed)
Pt discharged with infant. Discharge instructions, prescriptions, and follow up appointments given to and reviewed with patient. Pt verbalized understanding. To be escorted out by auxillary.  °

## 2021-02-18 NOTE — Anesthesia Postprocedure Evaluation (Signed)
Anesthesia Post Note  Patient: Beverely J Zwilling  Procedure(s) Performed: AN AD HOC LABOR EPIDURAL  Patient location during evaluation: Mother Baby Anesthesia Type: Epidural Level of consciousness: awake and alert Pain management: pain level controlled Vital Signs Assessment: post-procedure vital signs reviewed and stable Respiratory status: spontaneous breathing, nonlabored ventilation and respiratory function stable Cardiovascular status: stable Postop Assessment: no headache, no backache and epidural receding Anesthetic complications: no   No notable events documented.   Last Vitals:  Vitals:   02/18/21 0506 02/18/21 0734  BP: 121/74 (!) 89/54  Pulse: 77 80  Resp:  20  Temp:  36.8 C  SpO2:  100%    Last Pain:  Vitals:   02/18/21 0734  TempSrc: Oral  PainSc:                  Elmarie Mainland

## 2021-02-18 NOTE — Lactation Note (Signed)
This note was copied from a baby's chart. Lactation Consultation Note  Patient Name: Alexis Gutierrez BWIOM'B Date: 02/18/2021 Reason for consult: Follow-up assessment;Term Age:32 hours  Lactation follow-up before todays anticipated discharge. Mom expressed desire to breast and formula feed, but has not been putting baby to the breast or expressing milk frequently. Baby is tolerating formula well, no concerns with bottle feeding. Baby sleeping soundly this morning, mom eating breakfast. LC discussed feeding plan for home. Mom feels more confident with bottle feeding, she feels that baby is not satisfied with feedings at the breast. We did discuss milk supply and demand and normal course of lactation. Mom educated on building and taking supply away would be easier than trying to build supply later on. Mom verbalized understanding. With formula, encouraged feeding every 2-3 hours, reviewed appropriate stomach size and volumes for baby, and when to seek care at pediatrician.   Maternal Data Has patient been taught Hand Expression?: Yes Does the patient have breastfeeding experience prior to this delivery?: Yes How long did the patient breastfeed?: 2 days  Feeding Mother's Current Feeding Choice: Breast Milk and Formula Nipple Type: Slow - flow  LATCH Score                    Lactation Tools Discussed/Used    Interventions Interventions: Education;Pace feeding;Hand express;DEBP;Hand pump  Discharge Discharge Education: Engorgement and breast care;Warning signs for feeding baby;Outpatient recommendation  Consult Status Consult Status: Complete    Danford Bad 02/18/2021, 10:09 AM

## 2021-05-06 ENCOUNTER — Other Ambulatory Visit: Payer: Self-pay

## 2021-05-14 ENCOUNTER — Encounter: Payer: Self-pay | Admitting: Obstetrics and Gynecology

## 2021-05-14 ENCOUNTER — Other Ambulatory Visit (HOSPITAL_COMMUNITY)
Admission: RE | Admit: 2021-05-14 | Discharge: 2021-05-14 | Disposition: A | Discharge status: Home / Self Care | Payer: Medicaid Other | Source: Ambulatory Visit | Attending: Obstetrics and Gynecology | Admitting: Obstetrics and Gynecology

## 2021-05-14 ENCOUNTER — Other Ambulatory Visit: Payer: Self-pay

## 2021-05-14 ENCOUNTER — Ambulatory Visit (INDEPENDENT_AMBULATORY_CARE_PROVIDER_SITE_OTHER): Payer: Medicaid Other | Admitting: Obstetrics and Gynecology

## 2021-05-14 VITALS — BP 120/80 | Ht 60.0 in | Wt 298.0 lb

## 2021-05-14 DIAGNOSIS — Z124 Encounter for screening for malignant neoplasm of cervix: Secondary | ICD-10-CM | POA: Insufficient documentation

## 2021-05-14 DIAGNOSIS — Z3202 Encounter for pregnancy test, result negative: Secondary | ICD-10-CM | POA: Diagnosis not present

## 2021-05-14 DIAGNOSIS — Z30430 Encounter for insertion of intrauterine contraceptive device: Secondary | ICD-10-CM

## 2021-05-14 LAB — POCT URINE PREGNANCY: Preg Test, Ur: NEGATIVE

## 2021-05-14 NOTE — Progress Notes (Signed)
? ?Postpartum Visit  ?Chief Complaint:  ?Chief Complaint  ?Patient presents with  ? Postpartum Care  ? ? ?History of Present Illness: Patient is a 31 y.o. [id number] presents for postpartum visit. ? ?Date of delivery: 02/17/2021 ?Type of delivery: Vaginal ?Laceration: None  ?Lochia: normal   ? ?Date of last PAP: 07/2020 NIL HPV + ? ?She reports she has been feeling well ? ?Newborn Details:  ?SINGLETON :  ?1. Infant Status: Infant doing well at home with mother. ? ? ?Review of Systems: Review of Systems  ?Constitutional:  Negative for chills, fever, malaise/fatigue and weight loss.  ?HENT:  Negative for congestion, hearing loss and sinus pain.   ?Eyes:  Negative for blurred vision and double vision.  ?Respiratory:  Negative for cough, sputum production, shortness of breath and wheezing.   ?Cardiovascular:  Negative for chest pain, palpitations, orthopnea and leg swelling.  ?Gastrointestinal:  Negative for abdominal pain, constipation, diarrhea, nausea and vomiting.  ?Genitourinary:  Negative for dysuria, flank pain, frequency, hematuria and urgency.  ?Musculoskeletal:  Negative for back pain, falls and joint pain.  ?Skin:  Negative for itching and rash.  ?Neurological:  Negative for dizziness and headaches.  ?Psychiatric/Behavioral:  Negative for depression, substance abuse and suicidal ideas. The patient is not nervous/anxious.   ? ?Past Medical History:  ?Past Medical History:  ?Diagnosis Date  ? Anxiety   ? Asthma   ? Depression   ? Supervision of high risk pregnancy, antepartum 12/27/2017  ? Clinic [facility] Prenatal Labs Dating 8wk5d ultrasound Blood type: --/--/A POS Performed at [facility], [address] [place], [place] [address]  [phone]/07 1943)  Genetic Screen  NIPS: Normal XY Antibody:Negative (10/10 1003) Anatomic [place] Complete at 23 weeks  Rubella: 3.70 (10/10 1003) Varicella: Immune GTT Early:  91             Third trimester: 127 RPR: Non Reactive (02/20 0954)  [place]  ? ? ?Past Surgical History:   ?Past Surgical History:  ?Procedure Laterality Date  ? TONSILLECTOMY    ? ? ?Family History:  ?Family History  ?Problem Relation Age of Onset  ? Breast cancer Paternal Aunt   ?     11s  ? ? ?Social History:  ?Social History  ? ?Socioeconomic History  ? Marital status: Married  ?  Spouse name: Not on file  ? Number of children: Not on file  ? Years of education: Not on file  ? Highest education level: Not on file  ?Occupational History  ? Not on file  ?Tobacco Use  ? Smoking status: Former  ?  Types: Cigarettes  ?  Quit date: 06/11/2020  ?  Years since quitting: 0.9  ? Smokeless tobacco: Never  ?Vaping Use  ? Vaping Use: Never used  ?Substance and Sexual Activity  ? Alcohol use: No  ? Drug use: Not Currently  ? Sexual activity: Yes  ?  Birth control/protection: I.U.D.  ?  Comment: hx ocp- stopped 04/2020  ?Other Topics Concern  ? Not on file  ?Social History Narrative  ? Husband in [employer] 2020  ? ?Social Determinants of Health  ? ?Financial Resource Strain: Not on file  ?Food Insecurity: Not on file  ?Transportation Needs: Not on file  ?Physical Activity: Not on file  ?Stress: Not on file  ?Social Connections: Not on file  ?Intimate Partner Violence: Not At Risk  ? Fear of Current or Ex-Partner: No  ? Emotionally Abused: No  ? Physically Abused: No  ? Sexually Abused:  No  ? ? ?Allergies:  ?Allergies  ?Allergen Reactions  ? Amoxicillin Shortness Of Breath  ?  Has patient had a PCN reaction causing immediate rash, facial/tongue/throat swelling, SOB or lightheadedness with hypotension: Yes ?Has patient had a PCN reaction causing severe rash involving mucus membranes or skin necrosis: No ?Has patient had a PCN reaction that required hospitalization: No ?Has patient had a PCN reaction occurring within the last 10 years: No ?If all of the above answers are "NO", then may proceed with Cephalosporin use.  ? Eggs Or Egg-Derived Products Other (See Comments)  ?  Acid reflux  ? Penicillins Swelling  ?  Has patient had a PCN  reaction causing immediate rash, facial/tongue/throat swelling, SOB or lightheadedness with hypotension: Yes ?Has patient had a PCN reaction causing severe rash involving mucus membranes or skin necrosis: No ?Has patient had a PCN reaction that required hospitalization: No ?Has patient had a PCN reaction occurring within the last 10 years: No ?If all of the above answers are "NO", then may proceed with Cephalosporin use. ?  ? Tomato Other (See Comments)  ?  Acid reflux   ? ? ?Medications: ?Prior to Admission medications   ?Medication Sig Start Date End Date Taking? Authorizing Provider  ?ibuprofen (ADVIL) 600 MG tablet Take 1 tablet (600 mg total) by mouth every 6 (six) hours. ?Patient not taking: Reported on 05/14/2021 02/18/21   Vena AustriaStaebler, Andreas, MD  ?Prenat-Fe Poly-Methfol-FA-DHA (VITAFOL FE+) 90-0.6-0.4-200 MG CAPS Take 1 tablet by mouth daily. ?Patient not taking: Reported on 05/14/2021 11/17/20   Natale MilchSchuman, Artina Minella R, MD  ? ? ?Physical Exam ?Vitals:  ?Vitals:  ? 05/14/21 1415  ?BP: 120/80  ? ? ?Physical Exam ?Constitutional:   ?   Appearance: She is well-developed.  ?Genitourinary:  ?   Genitourinary Comments: External: Normal appearing vulva. No lesions noted.  ?Speculum examination: Normal appearing cervix. No blood in the vaginal vault. No discharge.  Bimanual examination: Uterus midline, non-tender, normal in size, shape and contour.  No CMT. No adnexal masses. No adnexal tenderness. Pelvis not fixed. ? ?Breast Exam: exam not performed ?  ?HENT:  ?   Head: Normocephalic and atraumatic.  ?Neck:  ?   Thyroid: No thyromegaly.  ?Cardiovascular:  ?   Rate and Rhythm: Normal rate and regular rhythm.  ?   Heart sounds: Normal heart sounds.  ?Pulmonary:  ?   Effort: Pulmonary effort is normal.  ?   Breath sounds: Normal breath sounds.  ?Abdominal:  ?   General: Bowel sounds are normal. There is no distension.  ?   Palpations: Abdomen is soft. There is no mass.  ?Musculoskeletal:  ?   Cervical back: Neck supple.   ?Neurological:  ?   Mental Status: She is alert and oriented to person, place, and time.  ?Skin: ?   General: Skin is warm and dry.  ?Psychiatric:     ?   Behavior: Behavior normal.     ?   Thought Content: Thought content normal.     ?   Judgment: Judgment normal.  ?Vitals reviewed.  ? ? ?IUD PROCEDURE NOTE: ? ?Tomi Likensboni J Pasko is a 31 y.o. [id number] here for IUD insertion. No GYN concerns.  Last pap smear 10 months ago. Pregnancy excluded: negative UPT ? ?IUD Insertion Procedure Note ?Patient identified, informed consent performed, consent signed.   Discussed risks of irregular bleeding, cramping, infection, malpositioning or misplacement of the IUD outside the uterus which may require further procedure such as laparoscopy, risk of failure <  1%. Time out was performed.   ? ?A bimanual exam showed the uterus to be anteverted.  Speculum placed in the vagina.  Cervix visualized.  Cleaned with Betadine x 2.  Grasped anteriorly with a single tooth tenaculum.  Uterus sounded to 9 cm.   Mirena IUD placed per manufacturer's recommendations.  Strings trimmed to 3 cm. Tenaculum was removed, good hemostasis noted.  Patient tolerated procedure well.  ? ?Patient was given post-procedure instructions.  She was advised to have backup contraception for one week.  Patient was also asked to check IUD strings periodically and follow up in 4 weeks for IUD check. ? ?Assessment: 31 y.o. [id number] presenting for 6 week postpartum visit ? ?Plan: ?Problem List Items Addressed This Visit   ?None ?Visit Diagnoses   ? ? Encounter for IUD insertion    -  Primary  ? Postpartum care and examination      ? Cervical cancer screening      ? Relevant Orders  ? Cytology - PAP  ? ?  ? ? ?1) Contraception-  IUD inserted today ? ?2)  Pap: performed today ? ?3) Patient underwent screening for postpartum depression with no concerns noted. ? ?- Follow up in 4 weeks  ? ?Adelene Idler MD, FACOG ?[facility] ?05/14/2021 ?2:58  PM ? ?

## 2021-05-14 NOTE — Patient Instructions (Signed)
Intrauterine Device Insertion, Care After This sheet gives you information about how to care for yourself after your procedure. Your health care provider may also give you more specific instructions. If you have problems or questions, contact your health care provider. What can I expect after the procedure? After the procedure, it is common to have: Cramps and pain in the abdomen. Bleeding. It may be light or heavy. This may last for a few days. Lower back pain. Dizziness. Headaches. Nausea. Follow these instructions at home:  Before resuming sexual activity, check to make sure that you can feel the IUD string or strings. You should be able to feel the end of the string below the opening of your cervix. If your IUD string is in place, you may resume sexual activity. If you had a hormonal IUD inserted more than 7 days after your most recent period started, you will need to use a backup method of birth control for 7 days after IUD insertion. Ask your health care provider whether this applies to you. Continue to check that the IUD is still in place by feeling for the strings after every menstrual period, or once a month. An IUD will not protect you from sexually transmitted infections (STIs). Use methods to prevent the exchange of body fluids between partners (barrier protection) every time you have sex. Barrier protection can be used during oral, vaginal, or anal sex. Commonly used barrier methods include: Female condom. Female condom. Dental dam. Take over-the-counter and prescription medicines only as told by your health care provider. Keep all follow-up visits as told by your health care provider. This is important. Contact a health care provider if: You feel light-headed or weak. You have any of the following problems with your IUD string or strings: The string bothers or hurts you or your sexual partner. You cannot feel the string. The string has gotten longer. You can feel the IUD in  your vagina. You think you may be pregnant, or you miss your menstrual period. You think you may have a sexually transmitted infection (STI). Get help right away if: You have flu-like symptoms, such as tiredness (fatigue) and muscle aches. You have a fever and chills. You have bleeding that is heavier or lasts longer than a normal menstrual cycle. You have abnormal or bad-smelling discharge from your vagina. You develop abdominal pain that is new, is getting worse, or is not in the same area of earlier cramping and pain. You have pain during sexual activity. Summary After the procedure, it is common to have cramps and pain in the abdomen. It is also common to have light bleeding or heavier bleeding that is like your menstrual period. Continue to check that the IUD is still in place by feeling for the strings after every menstrual period, or once a month. Keep all follow-up visits as told by your health care provider. This is important. Contact your health care provider if you have problems with your IUD strings, such as the string getting longer or bothering you or your sexual partner. This information is not intended to replace advice given to you by your health care provider. Make sure you discuss any questions you have with your health care provider. Document Revised: 02/13/2019 Document Reviewed: 02/13/2019 Elsevier Patient Education  2022 Elsevier Inc.  

## 2021-05-14 NOTE — Addendum Note (Signed)
Addended by: Cornelius Moras D on: 05/14/2021 03:01 PM ? ? Modules accepted: Orders ? ?

## 2021-05-19 LAB — CYTOLOGY - PAP
Comment: NEGATIVE
Comment: NEGATIVE
HPV 16: NEGATIVE
HPV 18 / 45: NEGATIVE
High risk HPV: POSITIVE — AB

## 2021-06-08 ENCOUNTER — Ambulatory Visit: Payer: Medicaid Other | Admitting: Obstetrics and Gynecology

## 2021-06-08 ENCOUNTER — Ambulatory Visit: Payer: Medicaid Other

## 2021-07-23 ENCOUNTER — Ambulatory Visit: Payer: Medicaid Other | Admitting: Family Medicine

## 2021-07-24 ENCOUNTER — Ambulatory Visit: Payer: Medicaid Other | Admitting: Family Medicine

## 2021-11-24 ENCOUNTER — Ambulatory Visit: Payer: Medicaid Other | Admitting: Obstetrics & Gynecology

## 2022-04-13 ENCOUNTER — Encounter: Payer: Self-pay | Admitting: Obstetrics & Gynecology

## 2022-04-13 ENCOUNTER — Other Ambulatory Visit (HOSPITAL_COMMUNITY)
Admission: RE | Admit: 2022-04-13 | Discharge: 2022-04-13 | Disposition: A | Discharge status: Home / Self Care | Payer: Medicaid Other | Source: Ambulatory Visit | Attending: Obstetrics & Gynecology | Admitting: Obstetrics & Gynecology

## 2022-04-13 ENCOUNTER — Ambulatory Visit (INDEPENDENT_AMBULATORY_CARE_PROVIDER_SITE_OTHER): Payer: Medicaid Other | Admitting: Obstetrics & Gynecology

## 2022-04-13 VITALS — BP 124/61 | HR 89 | Ht 60.0 in | Wt 311.0 lb

## 2022-04-13 DIAGNOSIS — R87612 Low grade squamous intraepithelial lesion on cytologic smear of cervix (LGSIL): Secondary | ICD-10-CM | POA: Diagnosis present

## 2022-04-13 DIAGNOSIS — B977 Papillomavirus as the cause of diseases classified elsewhere: Secondary | ICD-10-CM | POA: Insufficient documentation

## 2022-04-13 DIAGNOSIS — N870 Mild cervical dysplasia: Secondary | ICD-10-CM

## 2022-04-13 DIAGNOSIS — T8332XA Displacement of intrauterine contraceptive device, initial encounter: Secondary | ICD-10-CM

## 2022-04-13 DIAGNOSIS — L83 Acanthosis nigricans: Secondary | ICD-10-CM

## 2022-04-13 NOTE — Progress Notes (Signed)
   Established Patient Office Visit  Subjective   Patient ID: Alexis Gutierrez, female    DOB: Aug 10, 1990  Age: 32 y.o. MRN: [id number]  Chief Complaint  Patient presents with   Colposcopy    HPI   32 yo P3 here for a colpo. She had a pap 05/2021 that showed LGSIL. Her 2 previous paps were normal.   She uses Mirena for contraception, doesn't have periods. She denies any complaints today. Objective:     BP 124/61   Pulse 89   Ht 5' (1.524 m)   Wt (!) 311 lb (141.1 kg)   BMI 60.74 kg/m    Physical Exam   Well nourished, well hydrated Black female, no apparent distress She is ambulating and conversing normally. Acanthosis nigricans noted Consent signed, time out done. Graves speculum placed Patulous cervix noted. No IUD strings seen. Cervix prepped with acetic acid. Transformation zone seen in its entirety. Colpo adequate. Colpo normal ECC obtained. She tolerated the procedure well.  Assessment & Plan:   Problem List Items Addressed This Visit   None Visit Diagnoses     LGSIL on Pap smear of cervix    -  Primary   Relevant Orders   Surgical pathology   Colposcopy   High risk HPV infection       Relevant Orders   Surgical pathology   Colposcopy   Intrauterine contraceptive device threads lost, initial encounter       Relevant Orders   US PELVIC COMPLETE WITH TRANSVAGINAL      If ECC is negative, then pap in a year. If abnormal, then rec LEEP.  Pelvic ultrasound ordered to establish if the IUD is in the uterus.   Emily Filbert, MD

## 2022-04-14 LAB — HEMOGLOBIN A1C
Est. average glucose Bld gHb Est-mCnc: 108 mg/dL
Hgb A1c MFr Bld: 5.4 % (ref 4.8–5.6)

## 2022-04-15 ENCOUNTER — Encounter: Payer: Self-pay | Admitting: Obstetrics & Gynecology

## 2022-04-16 LAB — SURGICAL PATHOLOGY

## 2022-04-19 ENCOUNTER — Encounter: Payer: Self-pay | Admitting: Obstetrics & Gynecology

## 2022-04-19 ENCOUNTER — Other Ambulatory Visit: Payer: Medicaid Other

## 2022-04-27 ENCOUNTER — Ambulatory Visit
Admission: RE | Admit: 2022-04-27 | Discharge: 2022-04-27 | Disposition: A | Discharge status: Home / Self Care | Payer: Medicaid Other | Source: Ambulatory Visit | Attending: Obstetrics & Gynecology | Admitting: Obstetrics & Gynecology

## 2022-04-27 DIAGNOSIS — T8332XA Displacement of intrauterine contraceptive device, initial encounter: Secondary | ICD-10-CM

## 2022-05-13 ENCOUNTER — Other Ambulatory Visit (HOSPITAL_COMMUNITY)
Admission: RE | Admit: 2022-05-13 | Discharge: 2022-05-13 | Disposition: A | Discharge status: Home / Self Care | Payer: Medicaid Other | Source: Ambulatory Visit | Attending: Obstetrics & Gynecology | Admitting: Obstetrics & Gynecology

## 2022-05-13 ENCOUNTER — Encounter: Payer: Self-pay | Admitting: Obstetrics & Gynecology

## 2022-05-13 ENCOUNTER — Ambulatory Visit (INDEPENDENT_AMBULATORY_CARE_PROVIDER_SITE_OTHER): Payer: Medicaid Other | Admitting: Obstetrics & Gynecology

## 2022-05-13 VITALS — BP 110/51 | Ht 61.0 in | Wt 316.0 lb

## 2022-05-13 DIAGNOSIS — D069 Carcinoma in situ of cervix, unspecified: Secondary | ICD-10-CM | POA: Diagnosis not present

## 2022-05-13 DIAGNOSIS — N870 Mild cervical dysplasia: Secondary | ICD-10-CM | POA: Insufficient documentation

## 2022-05-13 DIAGNOSIS — Z113 Encounter for screening for infections with a predominantly sexual mode of transmission: Secondary | ICD-10-CM

## 2022-05-13 NOTE — Progress Notes (Signed)
    GYNECOLOGY PROGRESS NOTE  Subjective:    Patient ID: Alexis Gutierrez, female    DOB: January 22, 1991, 32 y.o.   MRN: [id number]  HPI  Patient is a 32 y.o. G64P3003 female who presents for a LEEP. She had a LGSIL pap 05/2021. Her colpo was 04/2022 and it was normal. The ECC specimen showed CIN 1.   At the time of her colpo her Mirena IUD strings were not visible. I ordered an ultrasound to determine if the IUD was in place. It was.    Review of Systems    Objective:   currently breastfeeding. There is no height or weight on file to calculate BMI. Well nourished, well hydrated Black female, no apparent distress She is ambulating and conversing normally.  Colpo Biopsy:   Risks, benefits, alternatives, and limitations of procedure explained to patient, including pain, bleeding, infection, failure to remove abnormal tissue and failure to cure dysplasia, need for repeat procedures, damage to pelvic organs, cervical incompetence.  Role of HPV,cervical dysplasia and need for close followup was empasized. Informed written consent was obtained. All questions were answered. Time out performed. Urine pregnancy test was negative.  ??Procedure: The patient was placed in lithotomy position and the bivalved coated speculum was placed in the patient's vagina. A grounding pad placed on the patient. Acetic acid was applied to the cervix and all appeared normal. I sprayed Hurricaine spray.  Local anesthesia was administered via an intracervical block using 15cc of 2% Lidocaine with epinephrine. The suction was turned on and the medium semicircular LEEP tip on 75 Watts of cutting current and cautery was used to excise the entire transformation zone and any areas of visible dysplasia. I obtained an ECC.  Excellent hemostasis was achieved using roller ball coagulation set at 50 Watts coagulation current. As per my usual, I coated the cone bed with Monsel's. The speculum was removed from the vagina. Specimens were sent  to pathology.  ?The patient tolerated the procedure well. Post-operative instructions given to patient, including instruction to seek medical attention for persistent bright red bleeding, fever, abdominal/pelvic pain, dysuria, nausea or vomiting. She was also told about the possibility of having copious yellow to black tinged discharge for weeks. She was counseled to avoid anything in the vagina (sex/douching/tampons) for 3 weeks.      Assessment:   1. CIN I (cervical intraepithelial neoplasia I)      Plan:   1. CIN I (cervical intraepithelial neoplasia I) on ECC - await pathology

## 2022-05-14 ENCOUNTER — Encounter: Payer: Self-pay | Admitting: Obstetrics & Gynecology

## 2022-05-17 LAB — SURGICAL PATHOLOGY

## 2022-05-19 ENCOUNTER — Encounter: Payer: Self-pay | Admitting: Obstetrics & Gynecology

## 2022-09-27 ENCOUNTER — Ambulatory Visit: Payer: Medicaid Other | Admitting: Obstetrics

## 2022-10-05 ENCOUNTER — Ambulatory Visit (INDEPENDENT_AMBULATORY_CARE_PROVIDER_SITE_OTHER): Payer: Medicaid Other | Admitting: Obstetrics

## 2022-10-05 ENCOUNTER — Other Ambulatory Visit (HOSPITAL_COMMUNITY)
Admission: RE | Admit: 2022-10-05 | Discharge: 2022-10-05 | Disposition: A | Discharge status: Home / Self Care | Payer: Medicaid Other | Source: Ambulatory Visit | Attending: Obstetrics | Admitting: Obstetrics

## 2022-10-05 ENCOUNTER — Encounter: Payer: Self-pay | Admitting: Obstetrics

## 2022-10-05 VITALS — BP 126/76 | HR 64 | Ht 61.0 in | Wt 297.0 lb

## 2022-10-05 DIAGNOSIS — Z30431 Encounter for routine checking of intrauterine contraceptive device: Secondary | ICD-10-CM

## 2022-10-05 DIAGNOSIS — N939 Abnormal uterine and vaginal bleeding, unspecified: Secondary | ICD-10-CM | POA: Diagnosis not present

## 2022-10-05 DIAGNOSIS — N898 Other specified noninflammatory disorders of vagina: Secondary | ICD-10-CM | POA: Diagnosis not present

## 2022-10-05 DIAGNOSIS — R102 Pelvic and perineal pain: Secondary | ICD-10-CM

## 2022-10-05 MED ORDER — METRONIDAZOLE 500 MG PO TABS: 500.0000 mg | ORAL_TABLET | Freq: Two times a day (BID) | ORAL | 0 refills | Status: AC

## 2022-10-05 NOTE — Progress Notes (Signed)
Obstetrics & Gynecology Office Visit   Chief Complaint:  Chief Complaint  Patient presents with   Pelvic Pain    Iud check,    Menstrual Problem   Vaginal Bleeding    History of Present Illness: Alexis Gutierrez presents with a complaint of feeling vaginal pain recently with intercourse. She is partnered with one person and has been with him for over a year. She had an IUD placed after the birth of her last child, approximately 1.5 years ago.  Recently, she feels discomfort with penetration, and describes that deeper penetration is what is bothering her. Initially, she was thinking about having her IUD removed, as she believed that the IUD was causing her pain.  Of note, she had a LEEP procedure performed by Dr. Marice Potter earlier this Spring, due to an abnormal pap with + HPV. She reports a HX of several chlamydia infections, with a previous partner. She is otherwise very happy with the IUD as a contraceptive method.   Review of Systems:  Review of Systems  Constitutional: Negative.   HENT: Negative.    Eyes: Negative.   Respiratory: Negative.    Cardiovascular: Negative.   Gastrointestinal: Negative.   Genitourinary: Negative.   Musculoskeletal: Negative.   Skin: Negative.   Neurological: Negative.   Endo/Heme/Allergies: Negative.   Psychiatric/Behavioral: Negative.       Past Medical History:  Past Medical History:  Diagnosis Date   Anxiety    Asthma    Depression    Supervision of high risk pregnancy, antepartum 12/27/2017   [facility] Dating 8wk5d ultrasound Blood type: --/--/A POS Performed at [facility], [address] [place], [place] [address]  [phone]/07 1943)  Genetic Screen  NIPS: Normal XY Antibody:Negative (10/10 1003) Anatomic [place] Complete at 23 weeks  Rubella: 3.70 (10/10 1003) Varicella: Immune GTT Early:  91             Third trimester: 127 RPR: Non Reactive (02/20 0954)  Rhoga    Past Surgical History:  Past Surgical History:   Procedure Laterality Date   TONSILLECTOMY      Gynecologic History: No LMP recorded.  Obstetric History: [id number]  Family History:  Family History  Problem Relation Age of Onset   Breast cancer Paternal Aunt        91s    Social History:  Social History   Socioeconomic History   Marital status: Legally Separated    Spouse name: Not on file   Number of children: Not on file   Years of education: Not on file   Highest education level: Not on file  Occupational History   Not on file  Tobacco Use   Smoking status: Former    Current packs/day: 0.00    Types: Cigarettes    Quit date: 06/11/2020    Years since quitting: 2.3   Smokeless tobacco: Never  Vaping Use   Vaping status: Never Used  Substance and Sexual Activity   Alcohol use: No   Drug use: Not Currently   Sexual activity: Yes    Birth control/protection: I.U.D.    Comment: hx ocp- stopped 04/2020  Other Topics Concern   Not on file  Social History Narrative   Husband in Military 2020   Social Determinants of Health   Financial Resource Strain: Low Risk  (08/25/2022)   Received from [facility]   Overall Financial Resource Strain (CARDIA)    Difficulty of Paying Living Expenses: Not hard at all  Food  Insecurity: Food Insecurity Present (08/25/2022)   Received from [facility]   Hunger Vital Sign    Worried About Running Out of Food in the Last Year: Often true    Ran Out of Food in the Last Year: Never true  Transportation Needs: Unmet Transportation Needs (08/25/2022)   Received from [facility]   PRAPARE - Transportation    Lack of Transportation (Medical): Yes    Lack of Transportation (Non-Medical): No  Physical Activity: Inactive (07/24/2018)   Exercise Vital Sign    Days of Exercise per Week: 0 days    Minutes of Exercise per Session: 0 min  Stress: No Stress Concern Present (07/24/2018)   [employer] of Occupational Health - Occupational Stress Questionnaire    Feeling of Stress  : Not at all  Social Connections: Unknown (07/24/2018)   Social Connection and Isolation Panel [NHANES]    Frequency of Communication with Friends and Family: More than three times a week    Frequency of Social Gatherings with Friends and Family: More than three times a week    Attends Religious Services: Never    Database administrator or Organizations: No    Attends Banker Meetings: Never    Marital Status: Not on file  Intimate Partner Violence: Not At Risk (06/27/2020)   Humiliation, Afraid, Rape, and Kick questionnaire    Fear of Current or Ex-Partner: No    Emotionally Abused: No    Physically Abused: No    Sexually Abused: No    Allergies:  Allergies  Allergen Reactions   Amoxicillin Shortness Of Breath    Has patient had a PCN reaction causing immediate rash, facial/tongue/throat swelling, SOB or lightheadedness with hypotension: Yes Has patient had a PCN reaction causing severe rash involving mucus membranes or skin necrosis: No Has patient had a PCN reaction that required hospitalization: No Has patient had a PCN reaction occurring within the last 10 years: No If all of the above answers are "NO", then may proceed with Cephalosporin use.   Egg-Derived Products Other (See Comments)    Acid reflux   Penicillins Swelling    Has patient had a PCN reaction causing immediate rash, facial/tongue/throat swelling, SOB or lightheadedness with hypotension: Yes Has patient had a PCN reaction causing severe rash involving mucus membranes or skin necrosis: No Has patient had a PCN reaction that required hospitalization: No Has patient had a PCN reaction occurring within the last 10 years: No If all of the above answers are "NO", then may proceed with Cephalosporin use.    Tomato Other (See Comments)    Acid reflux     Medications: Prior to Admission medications   Medication Sig Start Date End Date Taking? Authorizing Provider  FLUoxetine (PROZAC) 40 MG capsule Take  by mouth. 09/27/22  Yes [provider]  levonorgestrel (MIRENA) 20 MCG/DAY IUD 1 each by Intrauterine route once.   Yes [provider]  omeprazole (PRILOSEC) 20 MG capsule Take 20 mg by mouth daily.   Yes [provider]  oxybutynin (DITROPAN-XL) 5 MG 24 hr tablet Take 5 mg by mouth daily.   Yes [provider]    Physical Exam Vitals:  Vitals:   10/05/22 1054  BP: 126/76  Pulse: 64   No LMP recorded.  Physical Exam Constitutional:      Appearance: She is obese.     Comments: BMI 56  HENT:     Head: Normocephalic and atraumatic.  Cardiovascular:  Rate and Rhythm: Normal rate and regular rhythm.  Pulmonary:     Effort: Pulmonary effort is normal.     Breath sounds: Normal breath sounds.  Abdominal:     Palpations: Abdomen is soft.     Comments: Adipose-   Genitourinary:    General: Normal vulva.     Vagina: Vaginal discharge present.     Rectum: Normal.     Comments: No external lesions or irritation noted. Whtie, thin vaginal discharge, malodor noted. Normal vaginal mucosa. Short IUD strings seen at the os Skin:    General: Skin is warm and dry.  Neurological:     General: No focal deficit present.     Mental Status: She is oriented to person, place, and time.  Psychiatric:        Mood and Affect: Mood normal.        Behavior: Behavior normal.      Assessment: 32 y.o. G3P3003 with some new vaginal pain with intercourse Vaginal discharge and malodor   Plan: Problem List Items Addressed This Visit   None Visit Diagnoses     IUD check up    -  Primary   Vaginal bleeding       Relevant Orders   Cervicovaginal ancillary only   Pelvic pain       Relevant Orders   Cervicovaginal ancillary only      Aptima swab retrieved. After much discussion, she opts to keep her IUD in place. We will review the results of her cervical ancillary testing. I have prescribed flagyl in the meantime.  Mirna Mires, CNM  10/05/2022  11:53 AM

## 2022-10-06 ENCOUNTER — Encounter: Payer: Self-pay | Admitting: Obstetrics

## 2022-11-15 ENCOUNTER — Emergency Department
Admission: EM | Admit: 2022-11-15 | Discharge: 2022-11-15 | Disposition: A | Discharge status: Home / Self Care | Payer: Medicaid Other | Attending: Emergency Medicine | Admitting: Emergency Medicine

## 2022-11-15 ENCOUNTER — Other Ambulatory Visit: Payer: Self-pay

## 2022-11-15 ENCOUNTER — Emergency Department: Payer: Medicaid Other

## 2022-11-15 DIAGNOSIS — J069 Acute upper respiratory infection, unspecified: Secondary | ICD-10-CM | POA: Diagnosis not present

## 2022-11-15 DIAGNOSIS — R0789 Other chest pain: Secondary | ICD-10-CM | POA: Diagnosis present

## 2022-11-15 DIAGNOSIS — J45909 Unspecified asthma, uncomplicated: Secondary | ICD-10-CM | POA: Diagnosis not present

## 2022-11-15 LAB — CBC
HCT: 39.5 % (ref 36.0–46.0)
Hemoglobin: 12.6 g/dL (ref 12.0–15.0)
MCH: 26 pg (ref 26.0–34.0)
MCHC: 31.9 g/dL (ref 30.0–36.0)
MCV: 81.4 fL (ref 80.0–100.0)
Platelets: 237 10*3/uL (ref 150–400)
RBC: 4.85 MIL/uL (ref 3.87–5.11)
RDW: 14.6 % (ref 11.5–15.5)
WBC: 7.3 10*3/uL (ref 4.0–10.5)
nRBC: 0 % (ref 0.0–0.2)

## 2022-11-15 LAB — BASIC METABOLIC PANEL
Anion gap: 7 (ref 5–15)
BUN: 7 mg/dL (ref 6–20)
CO2: 24 mmol/L (ref 22–32)
Calcium: 8.6 mg/dL — ABNORMAL LOW (ref 8.9–10.3)
Chloride: 106 mmol/L (ref 98–111)
Creatinine, Ser: 0.74 mg/dL (ref 0.44–1.00)
GFR, Estimated: >60 mL/min (ref >60)
Glucose, Bld: 97 mg/dL (ref 70–99)
Potassium: 3.2 mmol/L — ABNORMAL LOW (ref 3.5–5.1)
Sodium: 137 mmol/L (ref 135–145)

## 2022-11-15 LAB — TROPONIN I (HIGH SENSITIVITY): Troponin I (High Sensitivity): <2 ng/L (ref <18)

## 2022-11-15 MED ORDER — NAPROXEN 500 MG PO TABS: 500.0000 mg | ORAL_TABLET | Freq: Two times a day (BID) | ORAL | 0 refills | Status: AC

## 2022-11-15 MED ORDER — KETOROLAC TROMETHAMINE 15 MG/ML IJ SOLN
15.0000 mg | Freq: Once | INTRAMUSCULAR | Status: AC
  Administered 2022-11-15: 15 mg via INTRAMUSCULAR
  Filled 2022-11-15: qty 1

## 2022-11-15 MED ORDER — IPRATROPIUM-ALBUTEROL 0.5-2.5 (3) MG/3ML IN SOLN
3.0000 mL | Freq: Once | RESPIRATORY_TRACT | Status: AC
  Administered 2022-11-15: 3 mL via RESPIRATORY_TRACT
  Filled 2022-11-15: qty 3

## 2022-11-15 MED ORDER — PREDNISONE 20 MG PO TABS: 40.0000 mg | ORAL_TABLET | Freq: Every day | ORAL | 0 refills | Status: AC

## 2022-11-15 MED ORDER — ALBUTEROL SULFATE HFA 108 (90 BASE) MCG/ACT IN AERS: 2.0000 | INHALATION_SPRAY | Freq: Four times a day (QID) | RESPIRATORY_TRACT | 2 refills | Status: AC | PRN

## 2022-11-15 NOTE — ED Provider Notes (Signed)
[facility] Provider Note    Event Date/Time   First MD Initiated Contact with Patient 11/15/22 2043     (approximate)   History   Chest Pain   HPI  Alexis Gutierrez is a 32 year old female presenting to the emergency department for evaluation of chest pain.  Patient reports over the past few days she has had cough, cold, congestion symptoms.  Her kids are sick with similar symptoms.  Since that time, she has developed some pain along her chest wall specifically along her left ribs as well as some central pain.  She reports that this is not uncommon when she has respiratory illness.  She does report a history of childhood asthma, but does not currently take any medications.  No fevers or chills.  Not on any hormonal medication.    Physical Exam   Triage Vital Signs: ED Triage Vitals  Encounter Vitals Group     BP 11/15/22 1859 (!) 116/58     Systolic BP Percentile --      Diastolic BP Percentile --      Pulse Rate 11/15/22 1858 86     Resp 11/15/22 1858 18     Temp 11/15/22 1858 98 F (36.7 C)     Temp Source 11/15/22 1858 Oral     SpO2 11/15/22 1858 99 %     Weight --      Height --      Head Circumference --      Peak Flow --      Pain Score 11/15/22 1856 9     Pain Loc --      Pain Education --      Exclude from Growth Chart --     Most recent vital signs: Vitals:   11/15/22 2045 11/15/22 2130  BP: (!) 136/103 125/85  Pulse: 86 82  Resp: (!) 22 (!) 22  Temp:    SpO2: 99% 100%     General: Awake, interactive  CV:  Regular rate, good peripheral perfusion.  Chest wall:  Reproducible tenderness palpation along the left lateral chest wall Resp:  Lungs tight with rare expiratory wheeze, respirations mildly labored Abd:  Soft, nondistended.  Neuro:  Symmetric facial movement, fluid speech   ED Results / Procedures / Treatments   Labs (all labs ordered are listed, but only abnormal results are displayed) Labs Reviewed  BASIC  METABOLIC PANEL - Abnormal; Notable for the following components:      Result Value   Potassium 3.2 (*)    Calcium 8.6 (*)    All other components within normal limits  CBC  POC URINE PREG, ED  TROPONIN I (HIGH SENSITIVITY)     EKG EKG independently reviewed interpreted by myself (ER attending) demonstrates:  EKG demonstrates sinus rhythm at a rate of 85, PR 138, QRS 86, QTc 437, no acute ST changes  RADIOLOGY Imaging independently reviewed and interpreted by myself demonstrates:  CXR without focal consolidation on my review  PROCEDURES:  Critical Care performed: No  Procedures   MEDICATIONS ORDERED IN ED: Medications  ipratropium-albuterol (DUONEB) 0.5-2.5 (3) MG/3ML nebulizer solution 3 mL (3 mLs Nebulization Given 11/15/22 2102)  ketorolac (TORADOL) 15 MG/ML injection 15 mg (15 mg Intramuscular Given 11/15/22 2102)     IMPRESSION / MDM / ASSESSMENT AND PLAN / ED COURSE  I reviewed the triage vital signs and the nursing notes.  Differential diagnosis includes, but is not limited to, airway or musculoskeletal inflammation in the setting of  likely viral illness, pneumonia, asthma exacerbation, lower suspicion ACS, low risk PE and PERC negative  [name] presentation is most consistent with acute presentation with potential threat to life or bodily function.  32 year old female presenting to the emerged department for evaluation of chest pain in the setting of recent respiratory symptoms.  Vital stable on presentation.  Does have some tight lung sounds with a history of asthma, ordered for nebulizer treatment as well as Toradol for suspected inflammatory component to her pain.  Labs reassuring including negative troponin and nonischemic EKG.  Very low suspicion ACS.  X-Kevon Tench without acute findings.   Patient reevaluated and reports improvement in both her chest pain as well as her breathing.  She is comfortable with discharge home.  Will discharge with a prescription for  anti-inflammatories, prednisone, and albuterol inhaler as I suspect she does have a mild asthma flare in the setting of acute respiratory illness.  Do think she is stable for discharge home.  Patient comfortable this plan.  Strict return precautions provided.  Patient discharged in stable condition.     FINAL CLINICAL IMPRESSION(S) / ED DIAGNOSES   Final diagnoses:  Upper respiratory tract infection, unspecified type  Chest wall pain     Rx / DC Orders   ED Discharge Orders          Ordered    albuterol (VENTOLIN HFA) 108 (90 Base) MCG/ACT inhaler  Every 6 hours PRN        11/15/22 2216    predniSONE (DELTASONE) 20 MG tablet  Daily with breakfast        11/15/22 2216    naproxen (NAPROSYN) 500 MG tablet  2 times daily with meals        11/15/22 2216             Note:  This document was prepared using Dragon voice recognition software and may include unintentional dictation errors.   Trinna Post, MD 11/15/22 2216

## 2022-11-15 NOTE — ED Triage Notes (Signed)
Pt to ED via POV from home. Pt reports centralized CP and left rib pain x2 days. Pt reports pain 9.5/10. Pt also reports SOB.

## 2022-11-15 NOTE — Discharge Instructions (Signed)
You were seen in the ER today for your chest pain and respiratory symptoms.  I suspect you likely have a viral illness that may have flared your asthma.  I sent a prescription for an anti-inflammatory, steroid, and albuterol inhaler to your pharmacy.  Please take these as directed.  Return to the ER for new or worsening symptoms.

## 2022-12-15 ENCOUNTER — Encounter: Payer: Self-pay | Admitting: Obstetrics

## 2022-12-15 ENCOUNTER — Other Ambulatory Visit (HOSPITAL_COMMUNITY)
Admission: RE | Admit: 2022-12-15 | Discharge: 2022-12-15 | Disposition: A | Discharge status: Home / Self Care | Payer: Medicaid Other | Source: Ambulatory Visit | Attending: Obstetrics | Admitting: Obstetrics

## 2022-12-15 ENCOUNTER — Ambulatory Visit (INDEPENDENT_AMBULATORY_CARE_PROVIDER_SITE_OTHER): Payer: Medicaid Other | Admitting: Obstetrics

## 2022-12-15 VITALS — BP 121/66 | HR 70 | Ht 60.0 in | Wt 297.0 lb

## 2022-12-15 DIAGNOSIS — N898 Other specified noninflammatory disorders of vagina: Secondary | ICD-10-CM | POA: Diagnosis not present

## 2022-12-15 DIAGNOSIS — R319 Hematuria, unspecified: Secondary | ICD-10-CM | POA: Insufficient documentation

## 2022-12-15 DIAGNOSIS — N644 Mastodynia: Secondary | ICD-10-CM | POA: Diagnosis not present

## 2022-12-15 DIAGNOSIS — R102 Pelvic and perineal pain: Secondary | ICD-10-CM | POA: Insufficient documentation

## 2022-12-15 LAB — POCT URINALYSIS DIPSTICK
Appearance: NEGATIVE
Bilirubin, UA: NEGATIVE
Blood, UA: NEGATIVE
Glucose, UA: NEGATIVE
Ketones, UA: NEGATIVE
Leukocytes, UA: NEGATIVE
Nitrite, UA: NEGATIVE
Protein, UA: NEGATIVE
Spec Grav, UA: 1.015 (ref 1.010–1.025)
Urobilinogen, UA: 0.2 U/dL
pH, UA: 6.5 (ref 5.0–8.0)

## 2022-12-15 NOTE — Progress Notes (Signed)
    GYNECOLOGY PROGRESS NOTE  Subjective:    Patient ID: Alexis Gutierrez, female    DOB: November 27, 1990, 32 y.o.   MRN: [id number]  HPI  Patient is a 32 y.o. G53P3003 female who presents for abnormal vaginal bleeding with pelvic pain. Pt reports she only sees the blood in the toilet when urinates, its not there when she wipes. She reports this going on for a few weeks. Also reports sore breast. Pt has an IUD and is sexually active reports no pain with intercourse.  She is partnered. Describes her lower abdominal pain as starting several weeks ago. No vaginal bleeding. No periods with her IUD.   The following portions of the patient's history were reviewed and updated as appropriate: allergies, current medications, past family history, past medical history, past social history, past surgical history, and problem list.  Review of Systems Constitutional: negative Respiratory: negative Cardiovascular: negative Gastrointestinal: negative Genitourinary:positive for frequency and and she noted blood after urinating last week. Nothing since Neurological: negative Behavioral/Psych: negative   Objective:   Blood pressure 121/66, pulse 70, height 5' (1.524 m), weight 297 lb (134.7 kg), currently breastfeeding. Body mass index is 58 kg/m. General appearance: cooperative, no distress, and morbidly obese Rates her pain a 10/10. Appears relaxed, no grimacing or facial expression expressing pain. Abdomen:  adipose; no pain with palpation in all four quadrants. Denies any N/V, diarrhea Pelvic: cervix normal in appearance, external genitalia normal, positive findings: positive whiff test or tenderness of left adnexa(e), rectovaginal septum normal, uterus normal size, shape, and consistency, and vagina normal without discharge Extremities: extremities normal, atraumatic, no cyanosis or edema Neurologic: Grossly normal  IUD strings are short but just visible t the os. ++CMT   Assessment:   1. Hematuria,  unspecified type   2. Pelvic pain   3. Soreness breast    4. Vaginal odor  Plan:  P: aptima swab sent.Will check for STIs Pelvic exam ordered- Will f/u based on her labs and the ultrasound.  Mirna Mires, CNM  12/15/2022 3:38 PM

## 2022-12-17 LAB — CERVICOVAGINAL ANCILLARY ONLY
Bacterial Vaginitis (gardnerella): POSITIVE — AB
Candida Glabrata: NEGATIVE
Candida Vaginitis: NEGATIVE
Chlamydia: NEGATIVE
Comment: NEGATIVE
Comment: NEGATIVE
Comment: NEGATIVE
Comment: NEGATIVE
Comment: NEGATIVE
Comment: NORMAL
Neisseria Gonorrhea: NEGATIVE
Trichomonas: NEGATIVE

## 2022-12-18 ENCOUNTER — Encounter: Payer: Self-pay | Admitting: Obstetrics

## 2022-12-18 ENCOUNTER — Other Ambulatory Visit: Payer: Self-pay | Admitting: Obstetrics

## 2022-12-18 DIAGNOSIS — N760 Acute vaginitis: Secondary | ICD-10-CM

## 2022-12-18 MED ORDER — METRONIDAZOLE 500 MG PO TABS: 500.0000 mg | ORAL_TABLET | Freq: Two times a day (BID) | ORAL | 0 refills | Status: AC

## 2022-12-18 NOTE — Progress Notes (Signed)
Recent office visit testing indicates BV. Rx for Metronidazole sent in for her and she is notified via MyChart.  Mirna Mires, CNM  12/18/2022 5:39 PM

## 2022-12-22 ENCOUNTER — Ambulatory Visit: Admission: RE | Admit: 2022-12-22 | Payer: Medicaid Other | Source: Ambulatory Visit

## 2022-12-29 ENCOUNTER — Other Ambulatory Visit: Payer: Self-pay | Admitting: Obstetrics

## 2022-12-29 ENCOUNTER — Ambulatory Visit
Admission: RE | Admit: 2022-12-29 | Discharge: 2022-12-29 | Disposition: A | Discharge status: Home / Self Care | Payer: Medicaid Other | Source: Ambulatory Visit | Attending: Obstetrics | Admitting: Obstetrics

## 2022-12-29 DIAGNOSIS — R319 Hematuria, unspecified: Secondary | ICD-10-CM

## 2022-12-29 DIAGNOSIS — R102 Pelvic and perineal pain: Secondary | ICD-10-CM | POA: Insufficient documentation

## 2022-12-29 DIAGNOSIS — N644 Mastodynia: Secondary | ICD-10-CM

## 2022-12-29 DIAGNOSIS — N898 Other specified noninflammatory disorders of vagina: Secondary | ICD-10-CM

## 2023-01-24 ENCOUNTER — Other Ambulatory Visit: Payer: Self-pay | Admitting: Obstetrics

## 2023-01-24 DIAGNOSIS — N958 Other specified menopausal and perimenopausal disorders: Secondary | ICD-10-CM

## 2023-01-24 DIAGNOSIS — R102 Pelvic and perineal pain: Secondary | ICD-10-CM

## 2023-01-24 NOTE — Progress Notes (Signed)
Reviewed her ultrasound. She has a left adnexal simple cyst which seems likely ot be the source of her pelvic pain. Per Radiology, will repeat the scan in 6 weeks. I have called the patient and carefully explained the scan report. Advised her to dose with 600 mg of Motrin over several dose times to address the discomfort. Also recommend she make an appt with [facility] or [facility] for follow up after her next scan.  She verbalized understanding. Mirna Mires, CNM  01/24/2023 1:42 PM

## 2023-02-18 ENCOUNTER — Other Ambulatory Visit: Payer: Medicaid Other

## 2023-03-08 ENCOUNTER — Other Ambulatory Visit: Payer: Medicaid Other

## 2023-03-11 ENCOUNTER — Other Ambulatory Visit: Payer: Medicaid Other

## 2023-03-18 ENCOUNTER — Ambulatory Visit (INDEPENDENT_AMBULATORY_CARE_PROVIDER_SITE_OTHER): Payer: Medicaid Other

## 2023-03-18 ENCOUNTER — Other Ambulatory Visit: Payer: Medicaid Other

## 2023-03-18 ENCOUNTER — Other Ambulatory Visit: Payer: Self-pay | Admitting: Obstetrics

## 2023-03-18 ENCOUNTER — Other Ambulatory Visit: Payer: Self-pay | Admitting: Obstetrics and Gynecology

## 2023-03-18 DIAGNOSIS — R102 Pelvic and perineal pain: Secondary | ICD-10-CM

## 2023-03-18 DIAGNOSIS — N9489 Other specified conditions associated with female genital organs and menstrual cycle: Secondary | ICD-10-CM

## 2023-03-18 DIAGNOSIS — N958 Other specified menopausal and perimenopausal disorders: Secondary | ICD-10-CM

## 2023-03-31 ENCOUNTER — Ambulatory Visit: Payer: Medicaid Other | Admitting: Certified Nurse Midwife

## 2023-04-04 ENCOUNTER — Encounter: Payer: Self-pay | Admitting: Certified Nurse Midwife

## 2023-04-18 ENCOUNTER — Ambulatory Visit: Payer: Medicaid Other | Admitting: Obstetrics

## 2023-04-19 ENCOUNTER — Ambulatory Visit: Payer: Medicaid Other | Admitting: Obstetrics

## 2023-04-20 ENCOUNTER — Encounter: Payer: Self-pay | Admitting: Obstetrics

## 2023-04-28 ENCOUNTER — Other Ambulatory Visit (HOSPITAL_COMMUNITY)
Admission: RE | Admit: 2023-04-28 | Discharge: 2023-04-28 | Disposition: A | Discharge status: Home / Self Care | Payer: Medicaid Other | Source: Ambulatory Visit | Attending: Obstetrics | Admitting: Obstetrics

## 2023-04-28 ENCOUNTER — Ambulatory Visit: Payer: Medicaid Other | Admitting: Obstetrics

## 2023-04-28 ENCOUNTER — Encounter: Payer: Self-pay | Admitting: Obstetrics

## 2023-04-28 VITALS — BP 110/75 | HR 71 | Ht 60.0 in | Wt 305.0 lb

## 2023-04-28 DIAGNOSIS — Z113 Encounter for screening for infections with a predominantly sexual mode of transmission: Secondary | ICD-10-CM

## 2023-04-28 DIAGNOSIS — Z30432 Encounter for removal of intrauterine contraceptive device: Secondary | ICD-10-CM | POA: Diagnosis not present

## 2023-04-28 NOTE — Progress Notes (Signed)
IUD REMOVAL  Subjective  HPI: Alexis Gutierrez is a 33 y.o. [id number] who presents today for removal of her IUD. She has had it for about 2 years and is having pain. She is not having periods with the IUD. She does not want a different form of contraception at this time. She is considering a pregnancy in the future and will probably use condoms until then. She would like routine STI swabs today.  Past Medical History:  Diagnosis Date   Anxiety    Asthma    Depression    Supervision of high risk pregnancy, antepartum 12/27/2017   [facility]inic [facility] Dating 8wk5d ultrasound Blood type: --/--/A POS Performed at [facility], [address] [place], [place] [address]  [phone]/07 1943)  Genetic Screen  NIPS: Normal XY Antibody:Negative (10/10 1003) Anatomic [place] Complete at 23 weeks  Rubella: 3.70 (10/10 1003) Varicella: Immune GTT Early:  91             Third trimester: 127 RPR: Non Reactive (02/20 0954)  Rhoga   Past Surgical History:  Procedure Laterality Date   TONSILLECTOMY     OB History     Gravida  3   Para  3   Term  3   Preterm  0   AB  0   Living  3      SAB      IAB      Ectopic      Multiple  0   Live Births  3          Allergies  Allergen Reactions   Amoxicillin Shortness Of Breath    Has patient had a PCN reaction causing immediate rash, facial/tongue/throat swelling, SOB or lightheadedness with hypotension: Yes Has patient had a PCN reaction causing severe rash involving mucus membranes or skin necrosis: No Has patient had a PCN reaction that required hospitalization: No Has patient had a PCN reaction occurring within the last 10 years: No If all of the above answers are "NO", then may proceed with Cephalosporin use.   Egg-Derived Products Other (See Comments)    Acid reflux   Penicillins Swelling    Has patient had a PCN reaction causing immediate rash, facial/tongue/throat swelling, SOB or lightheadedness with hypotension:  Yes Has patient had a PCN reaction causing severe rash involving mucus membranes or skin necrosis: No Has patient had a PCN reaction that required hospitalization: No Has patient had a PCN reaction occurring within the last 10 years: No If all of the above answers are "NO", then may proceed with Cephalosporin use.    Tomato Other (See Comments)    Acid reflux     ROS: See HPI  Objective  BP 110/75   Pulse 71   Ht 5' (1.524 m)   Wt (!) 305 lb (138.3 kg)   Breastfeeding No   BMI 59.57 kg/m   Physical examination   Pelvic:   Vulva: Normal appearance.  No lesions.  Vagina: No lesions or abnormalities noted.  Support: Normal pelvic support.  Urethra No masses tenderness or scarring.  Meatus Normal size without lesions or prolapse.  Cervix: Normal appearance.  No lesions. IUD strings visualized.  Perineum: Normal exam.  No lesions.   Procedure Note:  Cervix visualized with speculum. IUD strings grasped with ring forceps and easily removed with gentle traction. Small amount of bleeding noted. Eydie tolerated this well.  Assessment  -IUD removal -STI testing  Plan  -Post-procedure care reviewed with  Yanai. She will schedule an appt if she would like alternate contraception or with a +HPT. -STI swabs collected. F/u based on results.  Guadlupe Spanish, CNM

## 2023-05-02 ENCOUNTER — Encounter: Payer: Self-pay | Admitting: Obstetrics

## 2023-05-02 ENCOUNTER — Other Ambulatory Visit: Payer: Self-pay | Admitting: Obstetrics

## 2023-05-02 LAB — CERVICOVAGINAL ANCILLARY ONLY
Bacterial Vaginitis (gardnerella): POSITIVE — AB
Chlamydia: NEGATIVE
Comment: NEGATIVE
Comment: NEGATIVE
Comment: NEGATIVE
Comment: NORMAL
Neisseria Gonorrhea: NEGATIVE
Trichomonas: NEGATIVE

## 2023-05-02 MED ORDER — METRONIDAZOLE 500 MG PO TABS: 500.0000 mg | ORAL_TABLET | Freq: Two times a day (BID) | ORAL | 0 refills | Status: DC

## 2023-05-02 NOTE — Progress Notes (Signed)
+  BV. Rx for metronidazole 500 mg PO BID x 7 days sent to pharmacy. Zaia notified via MyChart.  M. Chryl Heck, CNM

## 2023-06-28 ENCOUNTER — Ambulatory Visit: Admitting: Obstetrics & Gynecology

## 2023-06-30 ENCOUNTER — Ambulatory Visit

## 2023-06-30 VITALS — BP 128/72 | Ht 64.0 in | Wt 309.5 lb

## 2023-06-30 DIAGNOSIS — Z309 Encounter for contraceptive management, unspecified: Secondary | ICD-10-CM | POA: Diagnosis not present

## 2023-06-30 DIAGNOSIS — Z3201 Encounter for pregnancy test, result positive: Secondary | ICD-10-CM | POA: Diagnosis not present

## 2023-06-30 LAB — PREGNANCY, URINE: Preg Test, Ur: POSITIVE — AB

## 2023-06-30 MED ORDER — PRENATAL 27-0.8 MG PO TABS: 1.0000 | ORAL_TABLET | Freq: Every day | ORAL | Status: AC

## 2023-06-30 NOTE — Progress Notes (Signed)
 UPT positive. Plans to receive prenatal care at [facility]; encouraged to establish care ASAP.   The patient was dispensed prenatal vitamins #100 today. I provided counseling today regarding the medication. We discussed the medication, the side effects and when to call clinic.   Positive pregnancy packet reviewed and given to patient. Also counseled on hydration and when to seek medical attention.   Patient given the opportunity to ask questions. Questions answered.   Clare Critchley, RN

## 2023-07-04 ENCOUNTER — Telehealth: Payer: Self-pay

## 2023-07-04 DIAGNOSIS — O3680X0 Pregnancy with inconclusive fetal viability, not applicable or unspecified: Secondary | ICD-10-CM

## 2023-07-04 NOTE — Telephone Encounter (Signed)
 Patient states she had a pregnancy test at the health department 06/30/23. She states she had an IUD removed in [place]. Her last period was 3/10. She isn't sure what she told the health department.

## 2023-07-06 ENCOUNTER — Other Ambulatory Visit (HOSPITAL_COMMUNITY)
Admission: RE | Admit: 2023-07-06 | Discharge: 2023-07-06 | Disposition: A | Discharge status: Home / Self Care | Source: Ambulatory Visit | Attending: Licensed Practical Nurse | Admitting: Licensed Practical Nurse

## 2023-07-06 ENCOUNTER — Ambulatory Visit (INDEPENDENT_AMBULATORY_CARE_PROVIDER_SITE_OTHER)

## 2023-07-06 VITALS — BP 122/77 | HR 82 | Ht 64.0 in | Wt 315.0 lb

## 2023-07-06 DIAGNOSIS — Z3A08 8 weeks gestation of pregnancy: Secondary | ICD-10-CM

## 2023-07-06 DIAGNOSIS — O219 Vomiting of pregnancy, unspecified: Secondary | ICD-10-CM

## 2023-07-06 DIAGNOSIS — Z3490 Encounter for supervision of normal pregnancy, unspecified, unspecified trimester: Secondary | ICD-10-CM | POA: Insufficient documentation

## 2023-07-06 DIAGNOSIS — Z113 Encounter for screening for infections with a predominantly sexual mode of transmission: Secondary | ICD-10-CM

## 2023-07-06 DIAGNOSIS — Z0189 Encounter for other specified special examinations: Secondary | ICD-10-CM

## 2023-07-06 DIAGNOSIS — Z3491 Encounter for supervision of normal pregnancy, unspecified, first trimester: Secondary | ICD-10-CM

## 2023-07-06 DIAGNOSIS — Z0283 Encounter for blood-alcohol and blood-drug test: Secondary | ICD-10-CM

## 2023-07-06 DIAGNOSIS — O99210 Obesity complicating pregnancy, unspecified trimester: Secondary | ICD-10-CM

## 2023-07-06 MED ORDER — ONDANSETRON 4 MG PO TBDP: 4.0000 mg | ORAL_TABLET | Freq: Three times a day (TID) | ORAL | 1 refills | Status: AC | PRN

## 2023-07-06 NOTE — Progress Notes (Signed)
 New OB Intake  I explained I am completing New OB Intake today. We discussed her EDD of 02/10/2024 that is based on LMP of 05/06/2023. Pt is G4/P3003. I reviewed her allergies, medications, Medical/Surgical/OB history, and appropriate screenings. There are no cats in the home.  Based on history, this is a/an pregnancy uncomplicated . Her obstetrical history is significant for obesity.  Patient Active Problem List   Diagnosis Date Noted   Supervision of low-risk pregnancy 07/06/2023   Hives 06/06/2019   Anxiety 04/26/2019   Recurrent major depressive disorder (HCC) 04/26/2019   Osteoarthritis of right hip 04/26/2019   GERD (gastroesophageal reflux disease) 04/26/2019   Bulging lumbar disc 04/26/2019   Ovarian cyst affecting pregnancy in first trimester, antepartum 12/27/2017   History of learning disability 07/09/2015    Concerns addressed today: Patient has no concerns.  Delivery Plans:  Plans to deliver at [facility].  Anatomy US  Explained first scheduled US  will be around 19 weeks.  Pt notified to arrive at 15 minutes early, no children are allowed.  Labs Discussed genetic screening with patient. Patient consents for genetic testing to be drawn at new OB visit. Discussed possible labs to be drawn at new OB appointment.  COVID Vaccine Patient has had COVID vaccine.   Social Determinants of Health Food Insecurity: denies food insecurity WIC Referral: Patient is interested in referral to [facility].  Transportation: Patient denies transportation needs. Childcare: Discussed no children allowed at ultrasound appointments.   First visit review I reviewed new OB appt with pt. I explained she will have blood work and pap smear/pelvic exam if indicated. Explained pt will be seen by Anice Kerbs CNM in 4 weeks.  Labs drawn today. encounter routed to appropriate provider.   Juanita Norlander, RN 07/06/2023  9:26 AM

## 2023-07-07 ENCOUNTER — Emergency Department
Admission: EM | Admit: 2023-07-07 | Discharge: 2023-07-07 | Disposition: A | Discharge status: Home / Self Care | Attending: Emergency Medicine | Admitting: Emergency Medicine

## 2023-07-07 ENCOUNTER — Emergency Department

## 2023-07-07 ENCOUNTER — Other Ambulatory Visit: Payer: Self-pay

## 2023-07-07 DIAGNOSIS — Z3A01 Less than 8 weeks gestation of pregnancy: Secondary | ICD-10-CM | POA: Diagnosis not present

## 2023-07-07 DIAGNOSIS — O468X1 Other antepartum hemorrhage, first trimester: Secondary | ICD-10-CM | POA: Diagnosis not present

## 2023-07-07 DIAGNOSIS — R1030 Lower abdominal pain, unspecified: Secondary | ICD-10-CM | POA: Insufficient documentation

## 2023-07-07 DIAGNOSIS — O208 Other hemorrhage in early pregnancy: Secondary | ICD-10-CM

## 2023-07-07 DIAGNOSIS — O26891 Other specified pregnancy related conditions, first trimester: Secondary | ICD-10-CM | POA: Diagnosis present

## 2023-07-07 DIAGNOSIS — Z3491 Encounter for supervision of normal pregnancy, unspecified, first trimester: Secondary | ICD-10-CM

## 2023-07-07 LAB — URINALYSIS, ROUTINE W REFLEX MICROSCOPIC
Bilirubin Urine: NEGATIVE
Bilirubin, UA: NEGATIVE
Glucose, UA: NEGATIVE
Glucose, UA: NEGATIVE mg/dL
Hgb urine dipstick: NEGATIVE
Ketones, UA: NEGATIVE
Ketones, ur: NEGATIVE mg/dL
Nitrite, UA: NEGATIVE
Nitrite: NEGATIVE
Protein, ur: NEGATIVE mg/dL
Protein,UA: NEGATIVE
RBC, UA: NEGATIVE
Specific Gravity, UA: 1.02 (ref 1.005–1.030)
Specific Gravity, Urine: 1.024 (ref 1.005–1.030)
Urobilinogen, Ur: 1 mg/dL (ref 0.2–1.0)
pH, UA: 6.5 (ref 5.0–7.5)
pH: 7 (ref 5.0–8.0)

## 2023-07-07 LAB — COMPREHENSIVE METABOLIC PANEL WITH GFR
ALT: 14 IU/L (ref 0–32)
ALT: 16 U/L (ref 0–44)
AST: 9 IU/L (ref 0–40)
AST: 11 U/L — ABNORMAL LOW (ref 15–41)
Albumin: 3.7 g/dL — ABNORMAL LOW (ref 3.9–4.9)
Albumin: 3.2 g/dL — ABNORMAL LOW (ref 3.5–5.0)
Alkaline Phosphatase: 56 IU/L (ref 44–121)
Alkaline Phosphatase: 43 U/L (ref 38–126)
Anion gap: 5 (ref 5–15)
BUN/Creatinine Ratio: 13 (ref 9–23)
BUN: 8 mg/dL (ref 6–20)
BUN: 8 mg/dL (ref 6–20)
Bilirubin Total: 0.3 mg/dL (ref 0.0–1.2)
CO2: 21 mmol/L (ref 20–29)
CO2: 23 mmol/L (ref 22–32)
Calcium: 9.3 mg/dL (ref 8.7–10.2)
Calcium: 8.6 mg/dL — ABNORMAL LOW (ref 8.9–10.3)
Chloride: 100 mmol/L (ref 96–106)
Chloride: 107 mmol/L (ref 98–111)
Creatinine, Ser: 0.63 mg/dL (ref 0.57–1.00)
Creatinine, Ser: 0.63 mg/dL (ref 0.44–1.00)
GFR, Estimated: >60 mL/min (ref >60)
Globulin, Total: 2.5 g/dL (ref 1.5–4.5)
Glucose, Bld: 122 mg/dL — ABNORMAL HIGH (ref 70–99)
Glucose: 77 mg/dL (ref 70–99)
Potassium: 4 mmol/L (ref 3.5–5.2)
Potassium: 3.4 mmol/L — ABNORMAL LOW (ref 3.5–5.1)
Sodium: 134 mmol/L (ref 134–144)
Sodium: 135 mmol/L (ref 135–145)
Total Bilirubin: 0.5 mg/dL (ref 0.0–1.2)
Total Protein: 6.2 g/dL (ref 6.0–8.5)
Total Protein: 6.3 g/dL — ABNORMAL LOW (ref 6.5–8.1)
eGFR: 119 mL/min/{1.73_m2} (ref >59)

## 2023-07-07 LAB — CBC/D/PLT+RPR+RH+ABO+RUBIGG...
Antibody Screen: NEGATIVE
Basophils Absolute: 0 10*3/uL (ref 0.0–0.2)
Basos: 0 %
EOS (ABSOLUTE): 0.2 10*3/uL (ref 0.0–0.4)
Eos: 3 %
HCV Ab: NONREACTIVE
HIV Screen 4th Generation wRfx: NONREACTIVE
Hematocrit: 37.4 % (ref 34.0–46.6)
Hemoglobin: 12 g/dL (ref 11.1–15.9)
Hepatitis B Surface Ag: NEGATIVE
Immature Grans (Abs): 0 10*3/uL (ref 0.0–0.1)
Immature Granulocytes: 0 %
Lymphocytes Absolute: 2.7 10*3/uL (ref 0.7–3.1)
Lymphs: 31 %
MCH: 26.3 pg — ABNORMAL LOW (ref 26.6–33.0)
MCHC: 32.1 g/dL (ref 31.5–35.7)
MCV: 82 fL (ref 79–97)
Monocytes Absolute: 0.6 10*3/uL (ref 0.1–0.9)
Monocytes: 7 %
Neutrophils Absolute: 5.1 10*3/uL (ref 1.4–7.0)
Neutrophils: 59 %
Platelets: 270 10*3/uL (ref 150–450)
RBC: 4.57 x10E6/uL (ref 3.77–5.28)
RDW: 13.2 % (ref 11.7–15.4)
RPR Ser Ql: NONREACTIVE
Rh Factor: POSITIVE
Rubella Antibodies, IGG: 2.49 {index} (ref >0.99)
Varicella zoster IgG: REACTIVE
WBC: 8.7 10*3/uL (ref 3.4–10.8)

## 2023-07-07 LAB — CBC WITH DIFFERENTIAL/PLATELET
Abs Immature Granulocytes: 0.02 10*3/uL (ref 0.00–0.07)
Basophils Absolute: 0 10*3/uL (ref 0.0–0.1)
Basophils Relative: 0 %
Eosinophils Absolute: 0.2 10*3/uL (ref 0.0–0.5)
Eosinophils Relative: 3 %
HCT: 36.9 % (ref 36.0–46.0)
Hemoglobin: 11.8 g/dL — ABNORMAL LOW (ref 12.0–15.0)
Immature Granulocytes: 0 %
Lymphocytes Relative: 31 %
Lymphs Abs: 2.5 10*3/uL (ref 0.7–4.0)
MCH: 26.5 pg (ref 26.0–34.0)
MCHC: 32 g/dL (ref 30.0–36.0)
MCV: 82.7 fL (ref 80.0–100.0)
Monocytes Absolute: 0.4 10*3/uL (ref 0.1–1.0)
Monocytes Relative: 6 %
Neutro Abs: 4.7 10*3/uL (ref 1.7–7.7)
Neutrophils Relative %: 60 %
Platelets: 249 10*3/uL (ref 150–400)
RBC: 4.46 MIL/uL (ref 3.87–5.11)
RDW: 14.1 % (ref 11.5–15.5)
WBC: 7.9 10*3/uL (ref 4.0–10.5)
nRBC: 0 % (ref 0.0–0.2)

## 2023-07-07 LAB — URINE CYTOLOGY ANCILLARY ONLY
Chlamydia: NEGATIVE
Comment: NEGATIVE
Comment: NORMAL
Neisseria Gonorrhea: NEGATIVE

## 2023-07-07 LAB — HCG, QUANTITATIVE, PREGNANCY: hCG, Beta Chain, Quant, S: 44688 m[IU]/mL — ABNORMAL HIGH (ref <5)

## 2023-07-07 LAB — ABO/RH: ABO/RH(D): A POS

## 2023-07-07 LAB — HEMOGLOBIN A1C
Est. average glucose Bld gHb Est-mCnc: 108 mg/dL
Hgb A1c MFr Bld: 5.4 % (ref 4.8–5.6)

## 2023-07-07 LAB — TSH+FREE T4
Free T4: 1.28 ng/dL (ref 0.82–1.77)
TSH: 1.49 u[IU]/mL (ref 0.450–4.500)

## 2023-07-07 LAB — MICROSCOPIC EXAMINATION
Bacteria, UA: NONE SEEN
Casts: NONE SEEN /LPF
WBC, UA: NONE SEEN /HPF (ref 0–5)

## 2023-07-07 LAB — HCV INTERPRETATION

## 2023-07-07 NOTE — ED Provider Triage Note (Signed)
 Emergency Medicine Provider Triage Evaluation Note  Alexis Gutierrez , a 33 y.o. female  was evaluated in triage.  Pt complains of G4, P3 presents of abdominal pain.  Patient states last period was around March 10.  Complaining of abdominal pain.  No bleeding at this time.  Sent by her OB.  Review of Systems  Positive:  Negative:   Physical Exam  LMP 05/06/2023 (Approximate)  Gen:   Awake, no distress   Resp:  Normal effort  MSK:   Moves extremities without difficulty  Other:    Medical Decision Making  Medically screening exam initiated at 2:09 PM.  Appropriate orders placed.  Alexis Gutierrez was informed that the remainder of the evaluation will be completed by another provider, this initial triage assessment does not replace that evaluation, and the importance of remaining in the ED until their evaluation is complete.     Alexis Figures, PA-C 07/07/23 [phone]

## 2023-07-07 NOTE — ED Provider Notes (Signed)
 [facility] Provider Note    Event Date/Time   First MD Initiated Contact with Patient 07/07/23 1537     (approximate)   History   Abdominal Pain   HPI  Alexis Gutierrez is a 33 y.o. female G4, P3 presents emergency department with lower abdominal cramping.  No bleeding.  She is approximately [redacted] weeks pregnant.  Patient unsure states her LMP was on March 10.  States was sent here from her OB office.      Physical Exam   Triage Vital Signs: ED Triage Vitals [07/07/23 1410]  Encounter Vitals Group     BP 127/67     Systolic BP Percentile      Diastolic BP Percentile      Pulse Rate 75     Resp 16     Temp 97.6 F (36.4 C)     Temp Source Oral     SpO2 94 %     Weight (!) 313 lb 0.9 oz (142 kg)     Height      Head Circumference      Peak Flow      Pain Score 3     Pain Loc      Pain Education      Exclude from Growth Chart     Most recent vital signs: Vitals:   07/07/23 1410  BP: 127/67  Pulse: 75  Resp: 16  Temp: 97.6 F (36.4 C)  SpO2: 94%     General: Awake, no distress.   CV:  Good peripheral perfusion. regular rate and  rhythm Resp:  Normal effort.  Abd:  No distention.   Other:      ED Results / Procedures / Treatments   Labs (all labs ordered are listed, but only abnormal results are displayed) Labs Reviewed  COMPREHENSIVE METABOLIC PANEL WITH GFR - Abnormal; Notable for the following components:      Result Value   Potassium 3.4 (*)    Glucose, Bld 122 (*)    Calcium 8.6 (*)    Total Protein 6.3 (*)    Albumin 3.2 (*)    AST 11 (*)    All other components within normal limits  CBC WITH DIFFERENTIAL/PLATELET - Abnormal; Notable for the following components:   Hemoglobin 11.8 (*)    All other components within normal limits  URINALYSIS, ROUTINE W REFLEX MICROSCOPIC - Abnormal; Notable for the following components:   Color, Urine YELLOW (*)    APPearance HAZY (*)    Leukocytes,Ua MODERATE (*)    Bacteria, UA  RARE (*)    All other components within normal limits  HCG, QUANTITATIVE, PREGNANCY - Abnormal; Notable for the following components:   hCG, Beta Chain, Quant, S 40,981 (*)    All other components within normal limits  POC URINE PREG, ED  ABO/RH     EKG     RADIOLOGY Ultrasound OB less than 14 weeks    PROCEDURES:   Procedures Chief Complaint  Patient presents with   Abdominal Pain      MEDICATIONS ORDERED IN ED: Medications - No data to display   IMPRESSION / MDM / ASSESSMENT AND PLAN / ED COURSE  I reviewed the triage vital signs and the nursing notes.                              Differential diagnosis includes, but is not limited to, IUP, ectopic, round  ligament pain, threatened miscarriage, miscarriage  [name] presentation is most consistent with acute illness / injury with system symptoms.   Labs reassuring with a beta-hCG of 44,688, moderate amount leuks noted in the urine but no bacteria,   Remainder labs reassuring  Ultrasound was independently reviewed interpreted by me by reviewing the images as having a gestational sac, yolk sac and fetal pole.  Will await radiology read as there is no heartbeat that could be due to early pregnancy  Radiologist confirmed IUP, does comment on a very small subchorionic hemorrhage.  I did explain this to the patient.  Gave her information about subchorionic hemorrhages.  She is to follow-up with her doctor.  She is in agreement treatment plan.  Discharged stable condition.   FINAL CLINICAL IMPRESSION(S) / ED DIAGNOSES   Final diagnoses:  Normal IUP (intrauterine pregnancy) on prenatal ultrasound, first trimester  Subchorionic hemorrhage of placenta in first trimester     Rx / DC Orders   ED Discharge Orders     None        Note:  This document was prepared using Dragon voice recognition software and may include unintentional dictation errors.    Delsie Figures, PA-C 07/07/23 1930    Claria Crofts, MD 07/07/23 [place]

## 2023-07-07 NOTE — ED Triage Notes (Signed)
 C/O abdominal cramping, onset last night. Denies vaginal bleeding.  G4 P3.  OBGYN group is [facility].  US  scheduled for 07/13/2023

## 2023-07-08 ENCOUNTER — Ambulatory Visit

## 2023-07-08 LAB — MONITOR DRUG PROFILE 14(MW)
Amphetamine Scrn, Ur: NEGATIVE ng/mL
BARBITURATE SCREEN URINE: NEGATIVE ng/mL
BENZODIAZEPINE SCREEN, URINE: NEGATIVE ng/mL
Buprenorphine, Urine: NEGATIVE ng/mL
CANNABINOIDS UR QL SCN: NEGATIVE ng/mL
Cocaine (Metab) Scrn, Ur: NEGATIVE ng/mL
Creatinine(Crt), U: 83.5 mg/dL (ref 20.0–300.0)
Fentanyl, Urine: NEGATIVE pg/mL
Meperidine Screen, Urine: NEGATIVE ng/mL
Methadone Screen, Urine: NEGATIVE ng/mL
OXYCODONE+OXYMORPHONE UR QL SCN: NEGATIVE ng/mL
Opiate Scrn, Ur: NEGATIVE ng/mL
Ph of Urine: 7.3 (ref 4.5–8.9)
Phencyclidine Qn, Ur: NEGATIVE ng/mL
Propoxyphene Scrn, Ur: NEGATIVE ng/mL
SPECIFIC GRAVITY: 1.018
Tramadol Screen, Urine: NEGATIVE ng/mL

## 2023-07-08 LAB — URINE CULTURE, OB REFLEX

## 2023-07-08 LAB — CULTURE, OB URINE

## 2023-07-08 LAB — NICOTINE SCREEN, URINE: Cotinine Ql Scrn, Ur: NEGATIVE ng/mL

## 2023-07-13 ENCOUNTER — Other Ambulatory Visit: Payer: Self-pay | Admitting: Licensed Practical Nurse

## 2023-07-13 ENCOUNTER — Ambulatory Visit
Admission: RE | Admit: 2023-07-13 | Discharge: 2023-07-13 | Disposition: A | Discharge status: Home / Self Care | Source: Ambulatory Visit | Attending: Licensed Practical Nurse | Admitting: Licensed Practical Nurse

## 2023-07-13 DIAGNOSIS — O3680X0 Pregnancy with inconclusive fetal viability, not applicable or unspecified: Secondary | ICD-10-CM

## 2023-07-20 ENCOUNTER — Other Ambulatory Visit: Payer: Self-pay

## 2023-07-20 ENCOUNTER — Encounter: Payer: Self-pay | Admitting: Emergency Medicine

## 2023-07-20 ENCOUNTER — Emergency Department

## 2023-07-20 ENCOUNTER — Emergency Department
Admission: EM | Admit: 2023-07-20 | Discharge: 2023-07-20 | Disposition: A | Discharge status: Home / Self Care | Attending: Emergency Medicine | Admitting: Emergency Medicine

## 2023-07-20 DIAGNOSIS — O9A211 Injury, poisoning and certain other consequences of external causes complicating pregnancy, first trimester: Secondary | ICD-10-CM | POA: Insufficient documentation

## 2023-07-20 DIAGNOSIS — O99891 Other specified diseases and conditions complicating pregnancy: Secondary | ICD-10-CM | POA: Diagnosis present

## 2023-07-20 DIAGNOSIS — X58XXXA Exposure to other specified factors, initial encounter: Secondary | ICD-10-CM | POA: Diagnosis not present

## 2023-07-20 DIAGNOSIS — S238XXA Sprain of other specified parts of thorax, initial encounter: Secondary | ICD-10-CM

## 2023-07-20 DIAGNOSIS — S239XXA Sprain of unspecified parts of thorax, initial encounter: Secondary | ICD-10-CM | POA: Insufficient documentation

## 2023-07-20 MED ORDER — ACETAMINOPHEN 650 MG RE SUPP: 650.0000 mg | RECTAL | 0 refills | Status: AC | PRN

## 2023-07-20 MED ORDER — ACETAMINOPHEN 325 MG PO TABS
650.0000 mg | ORAL_TABLET | Freq: Once | ORAL | Status: AC
  Administered 2023-07-20: 650 mg via ORAL
  Filled 2023-07-20: qty 2

## 2023-07-20 NOTE — ED Triage Notes (Signed)
 Pt presents to the ED via POV with complaints of L sided ribcage spasms and pain x 2 days. Rates the pain 8/10 no meds taken PTA. Of note, the patient is ~[redacted] weeks pregnant, no complications at this time. No falls nor injury. A&Ox4 at this time. Denies CP or SOB.

## 2023-07-20 NOTE — Discharge Instructions (Addendum)
 You have been diagnosed with sprain of chest wall.  Please take acetaminophen  1 tablet every 6 hours as needed for pain.  Please come back to ED or go to your OB/GYN if you have new symptoms or symptoms worsen.  Please try to use a recliner, try not to lay down all day.

## 2023-07-20 NOTE — ED Notes (Signed)
 Patient is appears relaxed on her stretcher, and is able to play games on her phone with no discernable distress.

## 2023-07-20 NOTE — ED Provider Notes (Signed)
 [facility] Provider Note    Event Date/Time   First MD Initiated Contact with Patient 07/20/23 1959     (approximate)   History   Spasms    HPI  Alexis Gutierrez is a 33 y.o. female    with a past medical history of pregnancy of 8 weeks, pelvic pain during pregnancy, birth, low back pain, BV anxiety, bilateral hip pain,who presents to the ED complaining of left chest pain. According to the patient, symptoms started 2 days ago.  Pain does not radiate to the left jaw, left arm.  Patient states pain increased with movement.  Patient denies abdominal pain, vaginal bleeding, trauma, cough, fever, chills.  Patient states she has GERD.  States she is having vaginal discharge that is white in color.  Patient is taking vitamins and states that she spends  most of the day on bed.      Physical Exam   Triage Vital Signs: ED Triage Vitals  Encounter Vitals Group     BP 07/20/23 1950 124/79     Systolic BP Percentile --      Diastolic BP Percentile --      Pulse Rate 07/20/23 1950 89     Resp 07/20/23 1950 18     Temp 07/20/23 1950 98.6 F (37 C)     Temp Source 07/20/23 1950 Oral     SpO2 07/20/23 1950 100 %     Weight 07/20/23 1949 (!) 314 lb 2.5 oz (142.5 kg)     Height 07/20/23 1949 5\' 4"  (1.626 m)     Head Circumference --      Peak Flow --      Pain Score 07/20/23 1948 8     Pain Loc --      Pain Education --      Exclude from Growth Chart --     Most recent vital signs: Vitals:   07/20/23 1950  BP: 124/79  Pulse: 89  Resp: 18  Temp: 98.6 F (37 C)  SpO2: 100%     Constitutional: Alert, NAD. Able to speak in complete sentences without cough or dyspnea  Eyes: Conjunctivae are normal.  Head: Atraumatic. Nose: No congestion/rhinnorhea. Mouth/Throat: Mucous membranes are moist.   Neck: Painless ROM. Supple. No JVD, nodes, thyromegaly  Cardiovascular:   Good peripheral circulation.RRR no murmurs, gallops, rubs  Respiratory: Normal  respiratory effort.  No retractions. Clear to auscultation bilaterally without wheezing or crackles  Gastrointestinal: Soft and nontender.  Musculoskeletal:  no deformity Left chest: Ender to palpation at the level of and medial axillary line with 6 rib.  Neurologic:  MAE spontaneously. No gross focal neurologic deficits are appreciated.  Skin:  Skin is warm, dry and intact. No rash noted. Psychiatric: Mood and affect are normal. Speech and behavior are normal.    ED Results / Procedures / Treatments   Labs (all labs ordered are listed, but only abnormal results are displayed) Labs Reviewed - No data to display   EKG See physician read    RADIOLOGY I independently reviewed and interpreted imaging and agree with radiologists findings.      PROCEDURES:  Critical Care performed:   Procedures   MEDICATIONS ORDERED IN ED: Medications  acetaminophen  (TYLENOL ) tablet 650 mg (650 mg Oral Given 07/20/23 2101)   Clinical Course as of 07/20/23 2153  Wed Jul 20, 2023  2149 DG Chest 1 View No active disease. [AE]  2149 Reassessed the patient, patient states some relief with acetaminophen .  Updated patient about EKG and chest x-ray within normal limits.  Patient is going to be discharged with acetaminophen  [AE]    Clinical Course User Index [AE] Awilda Lennox, PA-C    IMPRESSION / MDM / ASSESSMENT AND PLAN / ED COURSE  I reviewed the triage vital signs and the nursing notes.  Differential diagnosis includes, but is not limited to, rib fracture, muscle strain, pneumonia  [name] presentation is most consistent with acute complicated illness / injury requiring diagnostic workup.   [name] diagnosis is consistent with left chest wall muscle strain. I independently reviewed and interpreted imaging and agree with radiologists findings no fractures. Labs are  reassuring MI. I did review the patient's allergies and medications.The patient is in stable and satisfactory  condition for discharge home  Patient will be discharged home with prescriptions for Aminofen. Patient is to follow up with OB/GYN as needed or otherwise directed. Patient is given ED precautions to return to the ED for any worsening or new symptoms. Discussed plan of care with patient, answered all of patient's questions, Patient agreeable to plan of care. Advised patient to take medications according to the instructions on the label. Discussed possible side effects of new medications. Patient verbalized understanding.    FINAL CLINICAL IMPRESSION(S) / ED DIAGNOSES   Final diagnoses:  Sprain of chest wall, initial encounter     Rx / DC Orders   ED Discharge Orders          Ordered    acetaminophen  (TYLENOL ) 650 MG suppository  Every 4 hours PRN        07/20/23 2151             Note:  This document was prepared using Dragon voice recognition software and may include unintentional dictation errors.   Awilda Lennox, PA-C 07/20/23 2153    Viviano Ground, MD 07/22/23 [phone]

## 2023-07-26 ENCOUNTER — Encounter: Payer: Self-pay | Admitting: Licensed Practical Nurse

## 2023-07-26 ENCOUNTER — Ambulatory Visit: Payer: Self-pay | Admitting: Licensed Practical Nurse

## 2023-07-26 NOTE — Progress Notes (Signed)
 EDD changed based on 7wk US  Anice Kerbs, CNM   [facility]  07/26/23  10:30 AM

## 2023-07-29 ENCOUNTER — Ambulatory Visit

## 2023-08-08 ENCOUNTER — Other Ambulatory Visit: Payer: Self-pay

## 2023-08-08 ENCOUNTER — Emergency Department
Admission: EM | Admit: 2023-08-08 | Discharge: 2023-08-08 | Disposition: A | Discharge status: Home / Self Care | Attending: Emergency Medicine | Admitting: Emergency Medicine

## 2023-08-08 ENCOUNTER — Emergency Department

## 2023-08-08 DIAGNOSIS — R1032 Left lower quadrant pain: Secondary | ICD-10-CM | POA: Diagnosis not present

## 2023-08-08 DIAGNOSIS — Z3A11 11 weeks gestation of pregnancy: Secondary | ICD-10-CM | POA: Diagnosis not present

## 2023-08-08 DIAGNOSIS — O26891 Other specified pregnancy related conditions, first trimester: Secondary | ICD-10-CM | POA: Diagnosis present

## 2023-08-08 LAB — COMPREHENSIVE METABOLIC PANEL WITH GFR
ALT: 11 U/L (ref 0–44)
AST: 24 U/L (ref 15–41)
Albumin: 3 g/dL — ABNORMAL LOW (ref 3.5–5.0)
Alkaline Phosphatase: 38 U/L (ref 38–126)
Anion gap: 7 (ref 5–15)
BUN: 11 mg/dL (ref 6–20)
CO2: 22 mmol/L (ref 22–32)
Calcium: 8.6 mg/dL — ABNORMAL LOW (ref 8.9–10.3)
Chloride: 106 mmol/L (ref 98–111)
Creatinine, Ser: 0.66 mg/dL (ref 0.44–1.00)
GFR, Estimated: >60 mL/min (ref >60)
Glucose, Bld: 94 mg/dL (ref 70–99)
Potassium: 4.4 mmol/L (ref 3.5–5.1)
Sodium: 135 mmol/L (ref 135–145)
Total Bilirubin: 1 mg/dL (ref 0.0–1.2)
Total Protein: 6.4 g/dL — ABNORMAL LOW (ref 6.5–8.1)

## 2023-08-08 LAB — URINALYSIS, W/ REFLEX TO CULTURE (INFECTION SUSPECTED)
Bacteria, UA: NONE SEEN
Bilirubin Urine: NEGATIVE
Glucose, UA: NEGATIVE mg/dL
Hgb urine dipstick: NEGATIVE
Ketones, ur: NEGATIVE mg/dL
Leukocytes,Ua: NEGATIVE
Nitrite: NEGATIVE
Protein, ur: NEGATIVE mg/dL
Specific Gravity, Urine: 1.025 (ref 1.005–1.030)
pH: 6.5 (ref 5.0–8.0)

## 2023-08-08 LAB — CHLAMYDIA/NGC RT PCR (ARMC ONLY)
Chlamydia Tr: NOT DETECTED
N gonorrhoeae: NOT DETECTED

## 2023-08-08 LAB — LIPASE, BLOOD: Lipase: 31 U/L (ref 11–51)

## 2023-08-08 LAB — CBC
HCT: 36.5 % (ref 36.0–46.0)
Hemoglobin: 11.7 g/dL — ABNORMAL LOW (ref 12.0–15.0)
MCH: 26.5 pg (ref 26.0–34.0)
MCHC: 32.1 g/dL (ref 30.0–36.0)
MCV: 82.6 fL (ref 80.0–100.0)
Platelets: 211 10*3/uL (ref 150–400)
RBC: 4.42 MIL/uL (ref 3.87–5.11)
RDW: 14.4 % (ref 11.5–15.5)
WBC: 9.6 10*3/uL (ref 4.0–10.5)
nRBC: 0 % (ref 0.0–0.2)

## 2023-08-08 LAB — WET PREP, GENITAL
Clue Cells Wet Prep HPF POC: NONE SEEN
Sperm: NONE SEEN
Trich, Wet Prep: NONE SEEN
WBC, Wet Prep HPF POC: <10 (ref <10)
Yeast Wet Prep HPF POC: NONE SEEN

## 2023-08-08 LAB — HCG, QUANTITATIVE, PREGNANCY: hCG, Beta Chain, Quant, S: 76552 m[IU]/mL — ABNORMAL HIGH (ref <5)

## 2023-08-08 MED ORDER — ACETAMINOPHEN 500 MG PO TABS
1000.0000 mg | ORAL_TABLET | Freq: Once | ORAL | Status: AC
  Administered 2023-08-08: 1000 mg via ORAL
  Filled 2023-08-08: qty 2

## 2023-08-08 NOTE — ED Provider Notes (Signed)
 Care of this patient assumed from prior physician at 1500 pending ultrasound and disposition. Please see prior physician note for further details.  Briefly this is a 33 year old female [redacted] weeks gestation presenting to the emergency department with lower abdominal pain worse on the left side.  No associated vomiting.  Labs reassuring including normal white blood cell count, CMP without critical derangements.  Urine without evidence of infection.  Signed out to me pending renal ultrasound and transvaginal ultrasound.  Renal ultrasound resulted without evidence of hydronephrosis or other acute findings. OB ultrasound resulted with appropriate interval growth without acute findings.  Patient reassessed.  Feels improved after Tylenol .  I did discuss discharge and she is comfortable with this plan.  We discussed that if she has worsening pain, pain isolated to her right side most notably in the right lower quadrant, or any other new or concerning symptoms, she should return to the ER for further evaluation.  Otherwise she will continue to follow-up with her OB for further management.  Strict return precautions provided.  Patient discharged stable condition.   Claria Crofts, MD 08/08/23 [phone]

## 2023-08-08 NOTE — ED Triage Notes (Signed)
 Pt to ED via POV from home. Pt reports approx 11wks preg and started having bilateral lower abd pain yesterday. Pt states pain 10/10. Pt denies vaginal bleeding. Pt is G4P3. No N/V/D or injuries.

## 2023-08-08 NOTE — ED Notes (Signed)
 See triage notes. Patient c/o lower abdominal cramping. Patient is approximately [redacted] weeks pregnant. Patient denies bleeding.

## 2023-08-08 NOTE — ED Provider Notes (Signed)
 [facility] Provider Note    Event Date/Time   First MD Initiated Contact with Patient 08/08/23 1251     (approximate)   History   Abdominal Pain (11wks preg)   HPI  Alexis Gutierrez is a 33 y.o. female G4 P3 at approximately [redacted] weeks gestational age who presents to the emergency department with lower abdominal pain.  States that she started having lower abdominal pain last night but has been having intermittent lower abdominal pain throughout this pregnancy.  Complaining of pain to the left lower abdomen.  Worse with movement.  Denies significant nausea or vomiting.  No burning with urination.  Denies prior abdominal surgeries.  Denies noting any blood in her urine.  No falls or trauma.  No prior abdominal surgeries.     Physical Exam   Triage Vital Signs: ED Triage Vitals  Encounter Vitals Group     BP 08/08/23 1205 (!) 141/74     Systolic BP Percentile --      Diastolic BP Percentile --      Pulse Rate 08/08/23 1205 94     Resp 08/08/23 1205 18     Temp 08/08/23 1205 98.5 F (36.9 C)     Temp Source 08/08/23 1205 Oral     SpO2 08/08/23 1205 98 %     Weight --      Height --      Head Circumference --      Peak Flow --      Pain Score 08/08/23 1206 10     Pain Loc --      Pain Education --      Exclude from Growth Chart --     Most recent vital signs: Vitals:   08/08/23 1205  BP: (!) 141/74  Pulse: 94  Resp: 18  Temp: 98.5 F (36.9 C)  SpO2: 98%    Physical Exam Exam conducted with a chaperone present.  Constitutional:      Appearance: She is well-developed. She is obese.  HENT:     Head: Atraumatic.  Eyes:     Conjunctiva/sclera: Conjunctivae normal.  Cardiovascular:     Rate and Rhythm: Regular rhythm.  Pulmonary:     Effort: No respiratory distress.  Abdominal:     General: There is no distension.     Tenderness: There is abdominal tenderness in the left lower quadrant. There is no right CVA tenderness, left CVA  tenderness, guarding or rebound. Negative signs include Murphy's sign, Rovsing's sign, McBurney's sign and psoas sign.  Genitourinary:    Cervix: Normal.     Uterus: Normal.      Adnexa: Right adnexa normal and left adnexa normal.  Musculoskeletal:        General: Normal range of motion.     Cervical back: Normal range of motion.  Skin:    General: Skin is warm.  Neurological:     Mental Status: She is alert. Mental status is at baseline.      IMPRESSION / MDM / ASSESSMENT AND PLAN / ED COURSE  I reviewed the triage vital signs and the nursing notes.  On chart review patient had an ultrasound done at 8 weeks though showed intrauterine pregnancy.  Differential diagnosis including ovarian torsion, kidney stone, pyelonephritis, bacterial vaginosis, urinary tract infection, heterotopic pregnancy   RADIOLOGY Transvaginal ultrasound and renal ultrasound obtained.   Labs (all labs ordered are listed, but only abnormal results are displayed) Labs interpreted as -    Labs Reviewed  COMPREHENSIVE METABOLIC PANEL WITH GFR - Abnormal; Notable for the following components:      Result Value   Calcium 8.6 (*)    Total Protein 6.4 (*)    Albumin 3.0 (*)    All other components within normal limits  CBC - Abnormal; Notable for the following components:   Hemoglobin 11.7 (*)    All other components within normal limits  HCG, QUANTITATIVE, PREGNANCY - Abnormal; Notable for the following components:   hCG, Beta Chain, Quant, S [id number] (*)    All other components within normal limits  WET PREP, GENITAL  CHLAMYDIA/NGC RT PCR ([facility] ONLY)            LIPASE, BLOOD  URINALYSIS, W/ REFLEX TO CULTURE (INFECTION SUSPECTED)    Reevaluation after Tylenol  patient states he is feeling much better.  Repeat abdominal exam continues to have no significant abdominal tenderness to palpation.  No rebound or guarding.  No right lower quadrant abdominal tenderness to palpation.  Ambulated to the bathroom  in the emergency department multiple times with no difficulty.  Quant 76,000.  Chronic anemia.  Creatinine appears to be at her baseline.  Plan to follow-up ultrasound.  If negative can discharge home and has a follow-up appointment with her gynecologist on the 11th.  Discussed return precautions for any ongoing or worsening symptoms.     PROCEDURES:  Critical Care performed: No  Procedures  [name] presentation is most consistent with acute presentation with potential threat to life or bodily function.   MEDICATIONS ORDERED IN ED: Medications  acetaminophen  (TYLENOL ) tablet 1,000 mg (1,000 mg Oral Given 08/08/23 1331)    FINAL CLINICAL IMPRESSION(S) / ED DIAGNOSES   Final diagnoses:  Left lower quadrant abdominal pain     Rx / DC Orders   ED Discharge Orders     None        Note:  This document was prepared using Dragon voice recognition software and may include unintentional dictation errors.   Viviano Ground, MD 08/08/23 [phone]

## 2023-08-08 NOTE — Discharge Instructions (Addendum)
 Your testing today was overall reassuring.  Continue to follow-up with your OB as directed.  Return to the ER for new or worsening symptoms.

## 2023-08-11 ENCOUNTER — Encounter: Admitting: Certified Nurse Midwife

## 2023-08-17 ENCOUNTER — Ambulatory Visit (INDEPENDENT_AMBULATORY_CARE_PROVIDER_SITE_OTHER): Admitting: Licensed Practical Nurse

## 2023-08-17 VITALS — BP 125/87 | HR 88 | Wt 324.3 lb

## 2023-08-17 DIAGNOSIS — Z6841 Body Mass Index (BMI) 40.0 and over, adult: Secondary | ICD-10-CM

## 2023-08-17 DIAGNOSIS — Z3A12 12 weeks gestation of pregnancy: Secondary | ICD-10-CM

## 2023-08-17 DIAGNOSIS — Z3689 Encounter for other specified antenatal screening: Secondary | ICD-10-CM

## 2023-08-17 DIAGNOSIS — F419 Anxiety disorder, unspecified: Secondary | ICD-10-CM

## 2023-08-17 DIAGNOSIS — Z3401 Encounter for supervision of normal first pregnancy, first trimester: Secondary | ICD-10-CM

## 2023-08-17 DIAGNOSIS — Z3402 Encounter for supervision of normal first pregnancy, second trimester: Secondary | ICD-10-CM

## 2023-08-17 MED ORDER — ASPIRIN 81 MG PO TBEC: 81.0000 mg | DELAYED_RELEASE_TABLET | Freq: Every day | ORAL | 12 refills | Status: AC

## 2023-08-17 NOTE — Assessment & Plan Note (Signed)
-  NOB collected at intake visit, genetic screening and UPC collected today  -reviewed POC for high BMI, will need anesthesia consult x2, rec wt gain 10-22(has already gained 11lbs), aware she will need to transfer if BMI >60,  -Anatomy US  ordered at [facility]  -RTC in 4-6 weeks

## 2023-08-17 NOTE — Assessment & Plan Note (Signed)
-  pt stopped medications once pregnant, she does not want to be on medications, appears to be anxious (has had Ed visits to be sure her baby is ok) admits to worrying often-which she did in her previous pregnancies. is trying to get in a therapist but having a difficult time with scheduling.  -reassured the safety of medication if needed in pregnancy

## 2023-08-17 NOTE — Progress Notes (Signed)
 NEW OB HISTORY AND PHYSICAL  SUBJECTIVE:       Alexis Gutierrez is a 33 y.o. G13P3003 female, [name] last menstrual period was 05/06/2023 (approximate)., Estimated Date of Delivery: 02/26/24, [redacted]w[redacted]d, presents today for establishment of Prenatal Care. She reports nausea resolves with ginger ale and saltines. Upset stomach depending on what she eats, she was seen in the ED for abdominal pain.  Declines pap today   This was a planned pregnancy, Her EDD is 02/26/24 based off of 7wk US  Pt reports she was given Lovenox  after her second pregnancy because she had blood clots, on review of chart she was given Lovenox  PP for prophylaxis, no documented blood clot.  SVBx3 at term, denies complications. Breast fed her first and third child, she gained >70lbs with her first child, 12lbs with second child and lost weight with her third child.   Social history Partner/Relationship: Diaquon, this will be his first child. He lives with his parents  Living situation:Living with a friend, and her 3 children ages 28,5, and 3  currently looking for housing  Work: [employer], on Visual merchandiser: over due for apt  Exercise:walking  Substance AVW:[id number]   Indications for ASA therapy (per uptodate) One of the following: Previous pregnancy with preeclampsia, especially early onset and with an adverse outcome No Multifetal gestation No Chronic hypertension No Type 1 or 2 diabetes mellitus No Chronic kidney disease No Autoimmune disease (antiphospholipid syndrome, systemic lupus erythematosus) No  Two or more of the following: Nulliparity No Obesity (body mass index >30 kg/m2) Yes Family history of preeclampsia in mother or sister No Age >=35 years No, will be 33 at time of birth  Sociodemographic characteristics (African American race, low socioeconomic level) Yes Personal risk factors (eg, previous pregnancy with low birth weight or small for gestational age infant, previous adverse pregnancy outcome [eg,  stillbirth], interval >10 years between pregnancies) No   Gynecologic History [name] last menstrual period was 05/06/2023 (approximate). Normal Contraception: none Last Pap: 2023. Results were: 2023 LSIL, HPV pos, LEEP 06/02/2022   Obstetric History OB History  Gravida Para Term Preterm AB Living  4 3 3  0 0 3  SAB IAB Ectopic Multiple Live Births  0 0 0 0 3    # Outcome Date GA Lbr Len/2nd Weight Sex Type Anes PTL Lv  4 Current           3 Term 02/17/21 [redacted]w[redacted]d 03:40 / 00:49 6 lb 3.8 oz (2.83 kg) F Vag-Spont EPI  LIV  2 Term 07/25/18 [redacted]w[redacted]d 03:20 / 01:25 6 lb 15.8 oz (3.17 kg) M Vag-Spont EPI  LIV  1 Term 02/10/11 [redacted]w[redacted]d  7 lb 7 oz (3.374 kg) F Vag-Spont   LIV    Past Medical History:  Diagnosis Date   Anxiety    Asthma    Depression    Supervision of high risk pregnancy, antepartum 12/27/2017   Clinic [facility]renatal Labs Dating 8wk5d ultrasound Blood type: --/--/A POS Performed at [facility], [address] [place], [place] [address]  [id number])  Genetic Screen  NIPS: Normal XY Antibody:Negative (10/10 1003) Anatomic US  Complete at 23 weeks  Rubella: 3.70 (10/10 1003) Varicella: Immune GTT Early:  91             Third trimester: 127 RPR: Non Reactive (02/20 0954)  [place]    Past Surgical History:  Procedure Laterality Date   TONSILLECTOMY      Current Outpatient Medications on File Prior to Visit  Medication  Sig Dispense Refill   albuterol  (VENTOLIN  HFA) 108 (90 Base) MCG/ACT inhaler Inhale 2 puffs into the lungs every 6 (six) hours as needed for wheezing or shortness of breath. 18 g 2   famotidine (PEPCID) 40 MG tablet Take 1 tablet by mouth at bedtime.     Prenatal Vit-Fe Fumarate-FA (MULTIVITAMIN-PRENATAL) 27-0.8 MG TABS tablet Take 1 tablet by mouth daily at 12 noon.     acetaminophen  (TYLENOL ) 650 MG suppository Place 1 suppository (650 mg total) rectally every 4 (four) hours as needed. (Patient not taking: Reported on 08/17/2023) 12 suppository 0    ondansetron  (ZOFRAN -ODT) 4 MG disintegrating tablet Take 1 tablet (4 mg total) by mouth every 8 (eight) hours as needed for nausea or vomiting. (Patient not taking: Reported on 08/17/2023) 20 tablet 1   No current facility-administered medications on file prior to visit.    Allergies  Allergen Reactions   Amoxicillin Shortness Of Breath    Has patient had a PCN reaction causing immediate rash, facial/tongue/throat swelling, SOB or lightheadedness with hypotension: Yes Has patient had a PCN reaction causing severe rash involving mucus membranes or skin necrosis: No Has patient had a PCN reaction that required hospitalization: No Has patient had a PCN reaction occurring within the last 10 years: No If all of the above answers are NO, then may proceed with Cephalosporin use.   Egg-Derived Products Other (See Comments)    Acid reflux   Penicillins Swelling    Has patient had a PCN reaction causing immediate rash, facial/tongue/throat swelling, SOB or lightheadedness with hypotension: Yes Has patient had a PCN reaction causing severe rash involving mucus membranes or skin necrosis: No Has patient had a PCN reaction that required hospitalization: No Has patient had a PCN reaction occurring within the last 10 years: No If all of the above answers are NO, then may proceed with Cephalosporin use.    Tomato Other (See Comments)    Acid reflux     Social History   Socioeconomic History   Marital status: Single    Spouse name: Not on file   Number of children: 3   Years of education: Not on file   Highest education level: Not on file  Occupational History   Not on file  Tobacco Use   Smoking status: Former    Current packs/day: 0.00    Types: Cigarettes    Quit date: 06/11/2020    Years since quitting: 3.1   Smokeless tobacco: Never  Vaping Use   Vaping status: Former   Substances: Nicotine , Flavoring  Substance and Sexual Activity   Alcohol use: No   Drug use: Not Currently    Sexual activity: Yes    Comment: hx ocp- stopped 04/2020 ; IUD removed 04/2023  Other Topics Concern   Not on file  Social History Narrative   Husband in Military 2020   Social Drivers of Health   Financial Resource Strain: Low Risk  (06/07/2023)   Received from [facility]   Overall Financial Resource Strain (CARDIA)    Difficulty of Paying Living Expenses: Not hard at all  Food Insecurity: Food Insecurity Present (06/07/2023)   Received from [facility]   Hunger Vital Sign    Worried About Running Out of Food in the Last Year: Sometimes true    Ran Out of Food in the Last Year: Never true  Transportation Needs: No Transportation Needs (06/07/2023)   Received from [facility]   [facility] - Transportation  In the past 12 months, has lack of transportation kept you from medical appointments or from getting medications?: No    Lack of Transportation (Non-Medical): No  Physical Activity: Inactive (07/24/2018)   Exercise Vital Sign    Days of Exercise per Week: 0 days    Minutes of Exercise per Session: 0 min  Stress: No Stress Concern Present (07/24/2018)   [employer] of Occupational Health - Occupational Stress Questionnaire    Feeling of Stress : Not at all  Social Connections: Unknown (07/24/2018)   Social Connection and Isolation Panel [NHANES]    Frequency of Communication with Friends and Family: More than three times a week    Frequency of Social Gatherings with Friends and Family: More than three times a week    Attends Religious Services: Never    Database administrator or Organizations: No    Attends Banker Meetings: Never    Marital Status: Not on file  Intimate Partner Violence: Not At Risk (06/30/2023)   Humiliation, Afraid, Rape, and Kick questionnaire    Fear of Current or Ex-Partner: No    Emotionally Abused: No    Physically Abused: No    Sexually Abused: No    Family History  Problem Relation  Age of Onset   Hypertension Mother    Arthritis Mother    Breast cancer Paternal Aunt        25s   Cancer Paternal Uncle     The following portions of the patient's history were reviewed and updated as appropriate: allergies, current medications, past OB history, past medical history, past surgical history, past family history, past social history, and problem list.  Constitutional: Denied constitutional symptoms, night sweats, recent illness, fatigue, fever, insomnia and weight loss. Yes Tender breasts  Eyes: Denied eye symptoms, eye pain, photophobia, vision change and visual disturbance.  Ears/Nose/Throat/Neck: Denied ear, nose, throat or neck symptoms, hearing loss, nasal discharge, sinus congestion and sore throat.  Cardiovascular: Denied cardiovascular symptoms, arrhythmia, chest pain/pressure, edema, exercise intolerance, orthopnea and palpitations.  Respiratory: Denied pulmonary symptoms, asthma, pleuritic pain, productive sputum, cough, dyspnea and wheezing.  Gastrointestinal: Denied gastro-esophageal reflux, melena, and vomiting. Yes nausea and heartburn  Genitourinary: Denied genitourinary symptoms including symptomatic vaginal discharge, pelvic relaxation issues, and urinary complaints.  Musculoskeletal: Denied musculoskeletal symptoms, stiffness, swelling, muscle weakness and myalgia.  Dermatologic: Denied dermatology symptoms, rash and scar.  Neurologic: Denied neurology symptoms, dizziness, headache, neck pain and syncope.  Psychiatric: Denied psychiatric symptoms, anxiety and depression.  Endocrine: Denied endocrine symptoms including hot flashes and night sweats.     OBJECTIVE: Initial Physical Exam (New OB)  GENERAL APPEARANCE: alert, well appearing, in no apparent distress HEAD: normocephalic, atraumatic MOUTH: mucous membranes moist, pharynx normal without lesions THYROID: no thyromegaly or masses present BREASTS: no masses noted, no significant tenderness, no  palpable axillary nodes, no skin changes LUNGS: clear to auscultation, no wheezes, rales or rhonchi, symmetric air entry HEART: regular rate and rhythm, no murmurs ABDOMEN: soft, nontender, nondistended, no abnormal masses, no epigastric pain EXTREMITIES: no redness or tenderness in the calves or thighs SKIN: normal coloration and turgor, no rashes LYMPH NODES: no adenopathy palpable NEUROLOGIC: alert, oriented, normal speech, no focal findings or movement disorder noted  PELVIC EXAM exam deferred   ASSESSMENT: Normal pregnancy   PLAN: Routine prenatal care. We discussed an overview of prenatal care and when to call. Reviewed diet, exercise, and weight gain recommendations in pregnancy. Discussed benefits of breastfeeding and lactation resources at [facility].  I reviewed labs and answered all questions.  1. Encounter for supervision of normal first pregnancy in second trimester (Primary) - Protein / creatinine ratio, urine - MaterniT21 PLUS Core - US  MFM OB DETAIL +14 WK; Future - aspirin EC 81 MG tablet; Take 1 tablet (81 mg total) by mouth daily. Swallow whole.  Dispense: 30 tablet; Refill: 12  2. [redacted] weeks gestation of pregnancy - Protein / creatinine ratio, urine - MaterniT21 PLUS Core - aspirin EC 81 MG tablet; Take 1 tablet (81 mg total) by mouth daily. Swallow whole.  Dispense: 30 tablet; Refill: 12  3. BMI 50.0-59.9, adult (HCC) - US  MFM OB DETAIL +14 WK; Future  4. Encounter for fetal anatomic survey - US  MFM OB DETAIL +14 WK; Future  5. Anxiety  -pt stopped medications once pregnant, she does not want to be on medications, appears to be anxious (has had Ed visits to be sure her baby is ok) admits to worrying often-which she did in her previous pregnancies. is trying to get in a therapist but having a difficult time with scheduling.  -reassured the safety of medication if needed in pregnancy    -NOB collected at intake visit, genetic screening and UPC collected today   -reviewed POC for high BMI, will need anesthesia consult x2, rec wt gain 10-22(has already gained 11lbs), aware she will need to transfer if BMI >60,  -Anatomy US  ordered at MFM  -RTC in 4-6 weeks, pt will be out of town caring for her mother, she will not be back until the first week of August.   Gershon Shorten M Sieanna Vanstone, CNM

## 2023-08-18 ENCOUNTER — Encounter: Payer: Self-pay | Admitting: Licensed Practical Nurse

## 2023-08-18 LAB — PROTEIN / CREATININE RATIO, URINE
Creatinine, Urine: 161.2 mg/dL
Protein, Ur: 9.1 mg/dL
Protein/Creat Ratio: 56 mg/g{creat} (ref 0–200)

## 2023-08-21 LAB — MATERNIT 21 PLUS CORE, BLOOD
Fetal Fraction: 13
Result (T21): NEGATIVE
Trisomy 13 (Patau syndrome): NEGATIVE
Trisomy 18 (Edwards syndrome): NEGATIVE
Trisomy 21 (Down syndrome): NEGATIVE

## 2023-08-26 ENCOUNTER — Encounter: Admitting: Certified Nurse Midwife

## 2023-09-02 ENCOUNTER — Encounter: Payer: Self-pay | Admitting: Licensed Practical Nurse

## 2023-09-02 ENCOUNTER — Other Ambulatory Visit: Payer: Self-pay | Admitting: Licensed Practical Nurse

## 2023-09-02 DIAGNOSIS — Z3482 Encounter for supervision of other normal pregnancy, second trimester: Secondary | ICD-10-CM

## 2023-09-28 ENCOUNTER — Encounter: Admitting: Obstetrics

## 2023-10-04 ENCOUNTER — Encounter: Payer: Self-pay | Admitting: Licensed Practical Nurse

## 2023-10-17 ENCOUNTER — Ambulatory Visit

## 2023-10-17 ENCOUNTER — Other Ambulatory Visit

## 2023-10-19 ENCOUNTER — Other Ambulatory Visit

## 2023-10-19 ENCOUNTER — Ambulatory Visit

## 2023-10-19 ENCOUNTER — Encounter: Admitting: Obstetrics and Gynecology

## 2024-01-09 ENCOUNTER — Telehealth: Payer: Self-pay | Admitting: Licensed Clinical Social Worker

## 2024-01-09 NOTE — Telephone Encounter (Signed)
-----   Message from Andriette DELENA Lambing sent at 01/09/2024  8:01 AM EST ----- Regarding: Mental Health Referral Good morning,  Alexis Gutierrez is requesting mental health services. She is currently living in [place] with her 3 children, her mother, and her mother's boyfriend. Member planned to live there temporarily until section 8 housing opened up. No luck. Member reports she is severely stressed living with her alcoholic mother and that her mother does not want her child's father at her house. Member is due in December. Please give this member a call.   +Just for my knowledge, what mode of therapy do you offer? (In person, phone, video, etc).  Thank you for all that you do! Alexis Gutierrez

## 2024-02-26 ENCOUNTER — Inpatient Hospital Stay: Admit: 2024-02-26

## 2024-02-26 NOTE — Progress Notes (Signed)
------------------------------------------------------------------------------- °  Attestation signed by Oneil Hue, DO at 02/26/24 [phone] OB/GYN ATTENDING ATTESTATION  Laveda Fries was seen and evaluated, including the key elements of service. I discussed the findings, assessment and plan as documented below with Dr. Larene, and agree with the proposed management and treatment plans. My countersignature will demonstrate my concurrence with that documentation.  Oneil Hue, DO 02/26/2024 8:29 AM  -------------------------------------------------------------------------------  Vaginal Delivery Postpartum Note  Subjective: Essance Arwood is doing well this morning. Her pain is well controlled, minimal lochia, no nausea, vomiting, fever, headaches, chest pain, shortness of breath, or blurry vision. Baby is in the room and doing well per mother's report.   Objective: Temp:  [98 F (36.7 C)-98.4 F (36.9 C)] 98.1 F (36.7 C) Heart Rate:  [86-99] 92 Resp:  [19-24] 24 BP: (116-138)/(65-79) 116/65 SpO2:  [100 %] 100 % Temp (24hrs), Avg:98.2 F (36.8 C), Min:98 F (36.7 C), Max:98.4 F (36.9 C)  General: Alert, NAD Cardio: RRR Resp: CTAB, breathing comfortably on RA Abdomen: Fundus firm, appropriately tender to palpation, and below the umbilicus Ext: No edema  Assessment/Plan: Jakaila Norment is a 33 y.o. F on PPD2 s/p SVD after IOL obesity. She is doing well.   #Postpartum Care:  - Continue post partum care including oral analgesics and regular diet. - Encourage ambulation - breastfeeding - Considering BTL vs interval IUD for postpartum contraception. S/p depo shot .  #gHTN - MRBP at ONM visit w/ subsequent triage visit on 11/17 - PreE labs 11/26 reassuring, PC 0.19 - BP normal on admission  - MRBP 12/19 AM, now meets criteria for gHTN. Will continue to monitor BP - BP in goal since delivery, denies si/sx of preE   #Hx anxiety/depression - Takes medication outside of pregnancy but  does not want medication during pregnancy, mood stable  - No hx of postpartum anxiety/depression - Offered pp mood check    #Hx of learning disability - Chart review from [facility], no known dx  #Anemia of pregnancy - Hgb 10.2 with thirds, 10.8 10/30, 11.7 11/17 > 11.2 (11/26)>11.8 - PO Fe QOD  Plan to be reviewed on AM rounds.   Meghan R Ferriss, MD,02/26/2024 8:19 AM
